# Patient Record
Sex: Male | Born: 1965 | Race: White | Hispanic: No | Marital: Single | Smoking: Former smoker
Health system: Southern US, Community
[De-identification: ages and names within clinical notes are randomized; demographics above are authoritative.]

## PROBLEM LIST (undated history)

## (undated) DIAGNOSIS — E119 Type 2 diabetes mellitus without complications: Secondary | ICD-10-CM

---

## 2016-02-27 DIAGNOSIS — E119 Type 2 diabetes mellitus without complications: Secondary | ICD-10-CM | POA: Diagnosis not present

## 2016-02-27 DIAGNOSIS — I10 Essential (primary) hypertension: Secondary | ICD-10-CM | POA: Diagnosis not present

## 2016-02-27 DIAGNOSIS — Z1389 Encounter for screening for other disorder: Secondary | ICD-10-CM | POA: Diagnosis not present

## 2016-02-27 DIAGNOSIS — E559 Vitamin D deficiency, unspecified: Secondary | ICD-10-CM | POA: Diagnosis not present

## 2016-02-27 DIAGNOSIS — Z79899 Other long term (current) drug therapy: Secondary | ICD-10-CM | POA: Diagnosis not present

## 2016-02-27 DIAGNOSIS — E663 Overweight: Secondary | ICD-10-CM | POA: Diagnosis not present

## 2016-02-27 DIAGNOSIS — Z6826 Body mass index (BMI) 26.0-26.9, adult: Secondary | ICD-10-CM | POA: Diagnosis not present

## 2016-06-30 DIAGNOSIS — L03116 Cellulitis of left lower limb: Secondary | ICD-10-CM | POA: Diagnosis not present

## 2017-02-03 DIAGNOSIS — E559 Vitamin D deficiency, unspecified: Secondary | ICD-10-CM | POA: Diagnosis not present

## 2017-02-03 DIAGNOSIS — Z79899 Other long term (current) drug therapy: Secondary | ICD-10-CM | POA: Diagnosis not present

## 2017-02-03 DIAGNOSIS — E785 Hyperlipidemia, unspecified: Secondary | ICD-10-CM | POA: Diagnosis not present

## 2017-02-03 DIAGNOSIS — I10 Essential (primary) hypertension: Secondary | ICD-10-CM | POA: Diagnosis not present

## 2017-02-03 DIAGNOSIS — E1165 Type 2 diabetes mellitus with hyperglycemia: Secondary | ICD-10-CM | POA: Diagnosis not present

## 2017-02-03 DIAGNOSIS — E669 Obesity, unspecified: Secondary | ICD-10-CM | POA: Diagnosis not present

## 2017-02-19 ENCOUNTER — Telehealth: Payer: Self-pay | Admitting: General Practice

## 2017-02-19 NOTE — Telephone Encounter (Signed)
Call [facility] to clarify if we received the referral for the patient.

## 2017-05-14 ENCOUNTER — Ambulatory Visit: Payer: Self-pay | Admitting: Endocrinology

## 2017-08-20 DIAGNOSIS — E785 Hyperlipidemia, unspecified: Secondary | ICD-10-CM | POA: Diagnosis not present

## 2017-08-20 DIAGNOSIS — Z1331 Encounter for screening for depression: Secondary | ICD-10-CM | POA: Diagnosis not present

## 2017-08-20 DIAGNOSIS — E1165 Type 2 diabetes mellitus with hyperglycemia: Secondary | ICD-10-CM | POA: Diagnosis not present

## 2017-08-20 DIAGNOSIS — I10 Essential (primary) hypertension: Secondary | ICD-10-CM | POA: Diagnosis not present

## 2017-08-20 DIAGNOSIS — J449 Chronic obstructive pulmonary disease, unspecified: Secondary | ICD-10-CM | POA: Diagnosis not present

## 2017-12-17 DIAGNOSIS — E1165 Type 2 diabetes mellitus with hyperglycemia: Secondary | ICD-10-CM | POA: Diagnosis not present

## 2017-12-17 DIAGNOSIS — J449 Chronic obstructive pulmonary disease, unspecified: Secondary | ICD-10-CM | POA: Diagnosis not present

## 2017-12-17 DIAGNOSIS — E785 Hyperlipidemia, unspecified: Secondary | ICD-10-CM | POA: Diagnosis not present

## 2017-12-17 DIAGNOSIS — E039 Hypothyroidism, unspecified: Secondary | ICD-10-CM | POA: Diagnosis not present

## 2017-12-17 DIAGNOSIS — E559 Vitamin D deficiency, unspecified: Secondary | ICD-10-CM | POA: Diagnosis not present

## 2017-12-17 DIAGNOSIS — E669 Obesity, unspecified: Secondary | ICD-10-CM | POA: Diagnosis not present

## 2018-03-25 DIAGNOSIS — J449 Chronic obstructive pulmonary disease, unspecified: Secondary | ICD-10-CM | POA: Diagnosis not present

## 2018-03-25 DIAGNOSIS — E559 Vitamin D deficiency, unspecified: Secondary | ICD-10-CM | POA: Diagnosis not present

## 2018-03-25 DIAGNOSIS — E785 Hyperlipidemia, unspecified: Secondary | ICD-10-CM | POA: Diagnosis not present

## 2018-03-25 DIAGNOSIS — E669 Obesity, unspecified: Secondary | ICD-10-CM | POA: Diagnosis not present

## 2018-03-25 DIAGNOSIS — E1165 Type 2 diabetes mellitus with hyperglycemia: Secondary | ICD-10-CM | POA: Diagnosis not present

## 2018-12-21 DIAGNOSIS — R6889 Other general symptoms and signs: Secondary | ICD-10-CM | POA: Diagnosis not present

## 2018-12-21 DIAGNOSIS — Z1331 Encounter for screening for depression: Secondary | ICD-10-CM | POA: Diagnosis not present

## 2018-12-21 DIAGNOSIS — E119 Type 2 diabetes mellitus without complications: Secondary | ICD-10-CM | POA: Diagnosis not present

## 2018-12-21 DIAGNOSIS — J449 Chronic obstructive pulmonary disease, unspecified: Secondary | ICD-10-CM | POA: Diagnosis not present

## 2018-12-28 DIAGNOSIS — J441 Chronic obstructive pulmonary disease with (acute) exacerbation: Secondary | ICD-10-CM | POA: Diagnosis not present

## 2019-01-09 DIAGNOSIS — R0602 Shortness of breath: Secondary | ICD-10-CM | POA: Diagnosis not present

## 2019-01-09 DIAGNOSIS — R0789 Other chest pain: Secondary | ICD-10-CM | POA: Diagnosis not present

## 2019-01-09 DIAGNOSIS — J449 Chronic obstructive pulmonary disease, unspecified: Secondary | ICD-10-CM | POA: Diagnosis not present

## 2019-01-09 DIAGNOSIS — Z6827 Body mass index (BMI) 27.0-27.9, adult: Secondary | ICD-10-CM | POA: Diagnosis not present

## 2019-01-13 DIAGNOSIS — R079 Chest pain, unspecified: Secondary | ICD-10-CM | POA: Diagnosis not present

## 2019-01-13 DIAGNOSIS — J90 Pleural effusion, not elsewhere classified: Secondary | ICD-10-CM | POA: Diagnosis not present

## 2019-01-13 DIAGNOSIS — Z6827 Body mass index (BMI) 27.0-27.9, adult: Secondary | ICD-10-CM | POA: Diagnosis not present

## 2019-01-13 DIAGNOSIS — R918 Other nonspecific abnormal finding of lung field: Secondary | ICD-10-CM | POA: Diagnosis not present

## 2019-01-20 DIAGNOSIS — R918 Other nonspecific abnormal finding of lung field: Secondary | ICD-10-CM | POA: Diagnosis not present

## 2019-01-20 DIAGNOSIS — I700 Atherosclerosis of aorta: Secondary | ICD-10-CM | POA: Diagnosis not present

## 2019-01-20 DIAGNOSIS — J90 Pleural effusion, not elsewhere classified: Secondary | ICD-10-CM | POA: Diagnosis not present

## 2019-01-20 DIAGNOSIS — I2510 Atherosclerotic heart disease of native coronary artery without angina pectoris: Secondary | ICD-10-CM | POA: Diagnosis not present

## 2019-01-20 DIAGNOSIS — J439 Emphysema, unspecified: Secondary | ICD-10-CM | POA: Diagnosis not present

## 2019-01-23 ENCOUNTER — Emergency Department (HOSPITAL_COMMUNITY): Payer: 59

## 2019-01-23 ENCOUNTER — Other Ambulatory Visit: Payer: Self-pay

## 2019-01-23 ENCOUNTER — Encounter (HOSPITAL_COMMUNITY): Payer: Self-pay | Admitting: *Deleted

## 2019-01-23 ENCOUNTER — Inpatient Hospital Stay (HOSPITAL_COMMUNITY)
Admission: EM | Admit: 2019-01-23 | Discharge: 2019-01-31 | DRG: 853 | Disposition: A | Discharge status: Home / Self Care | Payer: 59 | Attending: Student | Admitting: Student

## 2019-01-23 DIAGNOSIS — D638 Anemia in other chronic diseases classified elsewhere: Secondary | ICD-10-CM | POA: Diagnosis present

## 2019-01-23 DIAGNOSIS — Z419 Encounter for procedure for purposes other than remedying health state, unspecified: Secondary | ICD-10-CM

## 2019-01-23 DIAGNOSIS — R059 Cough, unspecified: Secondary | ICD-10-CM

## 2019-01-23 DIAGNOSIS — E1165 Type 2 diabetes mellitus with hyperglycemia: Secondary | ICD-10-CM | POA: Diagnosis present

## 2019-01-23 DIAGNOSIS — R05 Cough: Secondary | ICD-10-CM | POA: Diagnosis not present

## 2019-01-23 DIAGNOSIS — Z79899 Other long term (current) drug therapy: Secondary | ICD-10-CM

## 2019-01-23 DIAGNOSIS — J939 Pneumothorax, unspecified: Secondary | ICD-10-CM

## 2019-01-23 DIAGNOSIS — J188 Other pneumonia, unspecified organism: Secondary | ICD-10-CM | POA: Diagnosis present

## 2019-01-23 DIAGNOSIS — E871 Hypo-osmolality and hyponatremia: Secondary | ICD-10-CM | POA: Diagnosis present

## 2019-01-23 DIAGNOSIS — A419 Sepsis, unspecified organism: Principal | ICD-10-CM | POA: Diagnosis present

## 2019-01-23 DIAGNOSIS — D649 Anemia, unspecified: Secondary | ICD-10-CM

## 2019-01-23 DIAGNOSIS — K069 Disorder of gingiva and edentulous alveolar ridge, unspecified: Secondary | ICD-10-CM | POA: Diagnosis present

## 2019-01-23 DIAGNOSIS — E785 Hyperlipidemia, unspecified: Secondary | ICD-10-CM | POA: Diagnosis present

## 2019-01-23 DIAGNOSIS — Z79890 Hormone replacement therapy (postmenopausal): Secondary | ICD-10-CM

## 2019-01-23 DIAGNOSIS — I10 Essential (primary) hypertension: Secondary | ICD-10-CM | POA: Diagnosis present

## 2019-01-23 DIAGNOSIS — J90 Pleural effusion, not elsewhere classified: Secondary | ICD-10-CM | POA: Diagnosis present

## 2019-01-23 DIAGNOSIS — Z7984 Long term (current) use of oral hypoglycemic drugs: Secondary | ICD-10-CM

## 2019-01-23 DIAGNOSIS — F17200 Nicotine dependence, unspecified, uncomplicated: Secondary | ICD-10-CM | POA: Diagnosis present

## 2019-01-23 DIAGNOSIS — Z9889 Other specified postprocedural states: Secondary | ICD-10-CM

## 2019-01-23 DIAGNOSIS — Z1159 Encounter for screening for other viral diseases: Secondary | ICD-10-CM

## 2019-01-23 DIAGNOSIS — J869 Pyothorax without fistula: Secondary | ICD-10-CM | POA: Diagnosis present

## 2019-01-23 DIAGNOSIS — E1110 Type 2 diabetes mellitus with ketoacidosis without coma: Secondary | ICD-10-CM | POA: Diagnosis present

## 2019-01-23 DIAGNOSIS — R0609 Other forms of dyspnea: Secondary | ICD-10-CM

## 2019-01-23 DIAGNOSIS — J440 Chronic obstructive pulmonary disease with acute lower respiratory infection: Secondary | ICD-10-CM | POA: Diagnosis present

## 2019-01-23 DIAGNOSIS — E039 Hypothyroidism, unspecified: Secondary | ICD-10-CM | POA: Diagnosis present

## 2019-01-23 DIAGNOSIS — K089 Disorder of teeth and supporting structures, unspecified: Secondary | ICD-10-CM

## 2019-01-23 HISTORY — DX: Type 2 diabetes mellitus without complications: E11.9

## 2019-01-23 MED ORDER — SODIUM CHLORIDE 0.9% FLUSH: 3.0000 mL | Freq: Once | INTRAVENOUS | Status: DC

## 2019-01-23 NOTE — ED Triage Notes (Signed)
Pt reports already being diagnosed with "lung infection" and having increase in sob, fever and cough. Mask on pt on arrival.

## 2019-01-23 NOTE — ED Notes (Signed)
Spoke with pt wife who brought back pt paperwork. He was seen today at [facility]. He has failed 2 rounds of IV antibiotics outpatient for possible PNA. CT on 5/29 shows a "cavitary lung mass" in the left lower lung with fluid collection. Negative covid test 4/29. Has felt bad for "over a month"

## 2019-01-24 ENCOUNTER — Encounter (HOSPITAL_COMMUNITY): Payer: Self-pay | Admitting: Internal Medicine

## 2019-01-24 ENCOUNTER — Inpatient Hospital Stay (HOSPITAL_COMMUNITY): Payer: 59

## 2019-01-24 DIAGNOSIS — A419 Sepsis, unspecified organism: Principal | ICD-10-CM

## 2019-01-24 DIAGNOSIS — Z79899 Other long term (current) drug therapy: Secondary | ICD-10-CM | POA: Diagnosis not present

## 2019-01-24 DIAGNOSIS — Z7984 Long term (current) use of oral hypoglycemic drugs: Secondary | ICD-10-CM | POA: Diagnosis not present

## 2019-01-24 DIAGNOSIS — E119 Type 2 diabetes mellitus without complications: Secondary | ICD-10-CM

## 2019-01-24 DIAGNOSIS — I10 Essential (primary) hypertension: Secondary | ICD-10-CM | POA: Diagnosis present

## 2019-01-24 DIAGNOSIS — E785 Hyperlipidemia, unspecified: Secondary | ICD-10-CM | POA: Diagnosis present

## 2019-01-24 DIAGNOSIS — J188 Other pneumonia, unspecified organism: Secondary | ICD-10-CM | POA: Diagnosis present

## 2019-01-24 DIAGNOSIS — Z9889 Other specified postprocedural states: Secondary | ICD-10-CM | POA: Diagnosis not present

## 2019-01-24 DIAGNOSIS — E1110 Type 2 diabetes mellitus with ketoacidosis without coma: Secondary | ICD-10-CM | POA: Diagnosis present

## 2019-01-24 DIAGNOSIS — Z1159 Encounter for screening for other viral diseases: Secondary | ICD-10-CM | POA: Diagnosis not present

## 2019-01-24 DIAGNOSIS — J90 Pleural effusion, not elsewhere classified: Secondary | ICD-10-CM

## 2019-01-24 DIAGNOSIS — E1165 Type 2 diabetes mellitus with hyperglycemia: Secondary | ICD-10-CM | POA: Diagnosis present

## 2019-01-24 DIAGNOSIS — E039 Hypothyroidism, unspecified: Secondary | ICD-10-CM | POA: Diagnosis present

## 2019-01-24 DIAGNOSIS — D638 Anemia in other chronic diseases classified elsewhere: Secondary | ICD-10-CM | POA: Diagnosis present

## 2019-01-24 DIAGNOSIS — J869 Pyothorax without fistula: Secondary | ICD-10-CM | POA: Diagnosis present

## 2019-01-24 DIAGNOSIS — J440 Chronic obstructive pulmonary disease with acute lower respiratory infection: Secondary | ICD-10-CM | POA: Diagnosis present

## 2019-01-24 DIAGNOSIS — R05 Cough: Secondary | ICD-10-CM | POA: Diagnosis present

## 2019-01-24 DIAGNOSIS — K0889 Other specified disorders of teeth and supporting structures: Secondary | ICD-10-CM | POA: Diagnosis not present

## 2019-01-24 DIAGNOSIS — K069 Disorder of gingiva and edentulous alveolar ridge, unspecified: Secondary | ICD-10-CM | POA: Diagnosis present

## 2019-01-24 DIAGNOSIS — F17200 Nicotine dependence, unspecified, uncomplicated: Secondary | ICD-10-CM | POA: Diagnosis present

## 2019-01-24 DIAGNOSIS — Z79890 Hormone replacement therapy (postmenopausal): Secondary | ICD-10-CM | POA: Diagnosis not present

## 2019-01-24 DIAGNOSIS — E871 Hypo-osmolality and hyponatremia: Secondary | ICD-10-CM | POA: Diagnosis present

## 2019-01-24 LAB — HEPATIC FUNCTION PANEL
ALT: 18 U/L (ref 0–44)
AST: 19 U/L (ref 15–41)
Albumin: 1.8 g/dL — ABNORMAL LOW (ref 3.5–5.0)
Alkaline Phosphatase: 176 U/L — ABNORMAL HIGH (ref 38–126)
Bilirubin, Direct: 0.2 mg/dL (ref 0.0–0.2)
Indirect Bilirubin: 0.9 mg/dL (ref 0.3–0.9)
Total Bilirubin: 1.1 mg/dL (ref 0.3–1.2)
Total Protein: 6.2 g/dL — ABNORMAL LOW (ref 6.5–8.1)

## 2019-01-24 LAB — CBC WITH DIFFERENTIAL/PLATELET
Abs Immature Granulocytes: 0.26 10*3/uL — ABNORMAL HIGH (ref 0.00–0.07)
Abs Immature Granulocytes: 0.19 10*3/uL — ABNORMAL HIGH (ref 0.00–0.07)
Basophils Absolute: 0 10*3/uL (ref 0.0–0.1)
Basophils Absolute: 0 10*3/uL (ref 0.0–0.1)
Basophils Relative: 0 %
Basophils Relative: 0 %
Eosinophils Absolute: 0 10*3/uL (ref 0.0–0.5)
Eosinophils Absolute: 0 10*3/uL (ref 0.0–0.5)
Eosinophils Relative: 0 %
Eosinophils Relative: 0 %
HCT: 33.4 % — ABNORMAL LOW (ref 39.0–52.0)
HCT: 29.4 % — ABNORMAL LOW (ref 39.0–52.0)
Hemoglobin: 10.7 g/dL — ABNORMAL LOW (ref 13.0–17.0)
Hemoglobin: 9.4 g/dL — ABNORMAL LOW (ref 13.0–17.0)
Immature Granulocytes: 1 %
Immature Granulocytes: 1 %
Lymphocytes Relative: 10 %
Lymphocytes Relative: 12 %
Lymphs Abs: 2.3 10*3/uL (ref 0.7–4.0)
Lymphs Abs: 2.2 10*3/uL (ref 0.7–4.0)
MCH: 27.4 pg (ref 26.0–34.0)
MCH: 27.5 pg (ref 26.0–34.0)
MCHC: 32 g/dL (ref 30.0–36.0)
MCHC: 32 g/dL (ref 30.0–36.0)
MCV: 85.4 fL (ref 80.0–100.0)
MCV: 86 fL (ref 80.0–100.0)
Monocytes Absolute: 2.2 10*3/uL — ABNORMAL HIGH (ref 0.1–1.0)
Monocytes Absolute: 1.8 10*3/uL — ABNORMAL HIGH (ref 0.1–1.0)
Monocytes Relative: 9 %
Monocytes Relative: 9 %
Neutro Abs: 19 10*3/uL — ABNORMAL HIGH (ref 1.7–7.7)
Neutro Abs: 14.7 10*3/uL — ABNORMAL HIGH (ref 1.7–7.7)
Neutrophils Relative %: 80 %
Neutrophils Relative %: 78 %
Platelets: 734 10*3/uL — ABNORMAL HIGH (ref 150–400)
Platelets: 584 10*3/uL — ABNORMAL HIGH (ref 150–400)
RBC: 3.91 MIL/uL — ABNORMAL LOW (ref 4.22–5.81)
RBC: 3.42 MIL/uL — ABNORMAL LOW (ref 4.22–5.81)
RDW: 13.5 % (ref 11.5–15.5)
RDW: 13.4 % (ref 11.5–15.5)
WBC: 23.8 10*3/uL — ABNORMAL HIGH (ref 4.0–10.5)
WBC: 19 10*3/uL — ABNORMAL HIGH (ref 4.0–10.5)
nRBC: 0 % (ref 0.0–0.2)
nRBC: 0 % (ref 0.0–0.2)

## 2019-01-24 LAB — URINALYSIS, ROUTINE W REFLEX MICROSCOPIC
Bacteria, UA: NONE SEEN
Bacteria, UA: NONE SEEN
Bilirubin Urine: NEGATIVE
Bilirubin Urine: NEGATIVE
Glucose, UA: 50 mg/dL — AB
Glucose, UA: >=500 mg/dL — AB
Hgb urine dipstick: NEGATIVE
Hgb urine dipstick: NEGATIVE
Ketones, ur: 20 mg/dL — AB
Ketones, ur: NEGATIVE mg/dL
Leukocytes,Ua: NEGATIVE
Leukocytes,Ua: NEGATIVE
Nitrite: NEGATIVE
Nitrite: NEGATIVE
Protein, ur: 30 mg/dL — AB
Protein, ur: 30 mg/dL — AB
Specific Gravity, Urine: 1.021 (ref 1.005–1.030)
Specific Gravity, Urine: 1.03 (ref 1.005–1.030)
pH: 5 (ref 5.0–8.0)
pH: 7 (ref 5.0–8.0)

## 2019-01-24 LAB — COMPREHENSIVE METABOLIC PANEL
ALT: 21 U/L (ref 0–44)
AST: 21 U/L (ref 15–41)
Albumin: 2.3 g/dL — ABNORMAL LOW (ref 3.5–5.0)
Alkaline Phosphatase: 231 U/L — ABNORMAL HIGH (ref 38–126)
Anion gap: 16 — ABNORMAL HIGH (ref 5–15)
BUN: 22 mg/dL — ABNORMAL HIGH (ref 6–20)
CO2: 25 mmol/L (ref 22–32)
Calcium: 8.8 mg/dL — ABNORMAL LOW (ref 8.9–10.3)
Chloride: 82 mmol/L — ABNORMAL LOW (ref 98–111)
Creatinine, Ser: 0.97 mg/dL (ref 0.61–1.24)
GFR calc Af Amer: >60 mL/min (ref >60)
GFR calc non Af Amer: >60 mL/min (ref >60)
Glucose, Bld: 432 mg/dL — ABNORMAL HIGH (ref 70–99)
Potassium: 4.7 mmol/L (ref 3.5–5.1)
Sodium: 123 mmol/L — ABNORMAL LOW (ref 135–145)
Total Bilirubin: 1.1 mg/dL (ref 0.3–1.2)
Total Protein: 7.6 g/dL (ref 6.5–8.1)

## 2019-01-24 LAB — BASIC METABOLIC PANEL
Anion gap: 16 — ABNORMAL HIGH (ref 5–15)
Anion gap: 11 (ref 5–15)
Anion gap: 10 (ref 5–15)
Anion gap: 10 (ref 5–15)
Anion gap: 9 (ref 5–15)
BUN: 18 mg/dL (ref 6–20)
BUN: 16 mg/dL (ref 6–20)
BUN: 17 mg/dL (ref 6–20)
BUN: 14 mg/dL (ref 6–20)
BUN: 13 mg/dL (ref 6–20)
CO2: 23 mmol/L (ref 22–32)
CO2: 24 mmol/L (ref 22–32)
CO2: 24 mmol/L (ref 22–32)
CO2: 24 mmol/L (ref 22–32)
CO2: 25 mmol/L (ref 22–32)
Calcium: 8 mg/dL — ABNORMAL LOW (ref 8.9–10.3)
Calcium: 8 mg/dL — ABNORMAL LOW (ref 8.9–10.3)
Calcium: 7.9 mg/dL — ABNORMAL LOW (ref 8.9–10.3)
Calcium: 7.9 mg/dL — ABNORMAL LOW (ref 8.9–10.3)
Calcium: 7.8 mg/dL — ABNORMAL LOW (ref 8.9–10.3)
Chloride: 91 mmol/L — ABNORMAL LOW (ref 98–111)
Chloride: 94 mmol/L — ABNORMAL LOW (ref 98–111)
Chloride: 96 mmol/L — ABNORMAL LOW (ref 98–111)
Chloride: 97 mmol/L — ABNORMAL LOW (ref 98–111)
Chloride: 96 mmol/L — ABNORMAL LOW (ref 98–111)
Creatinine, Ser: 0.85 mg/dL (ref 0.61–1.24)
Creatinine, Ser: 0.71 mg/dL (ref 0.61–1.24)
Creatinine, Ser: 0.62 mg/dL (ref 0.61–1.24)
Creatinine, Ser: 0.58 mg/dL — ABNORMAL LOW (ref 0.61–1.24)
Creatinine, Ser: 0.67 mg/dL (ref 0.61–1.24)
GFR calc Af Amer: >60 mL/min (ref >60)
GFR calc Af Amer: >60 mL/min (ref >60)
GFR calc Af Amer: >60 mL/min (ref >60)
GFR calc Af Amer: >60 mL/min (ref >60)
GFR calc Af Amer: >60 mL/min (ref >60)
GFR calc non Af Amer: >60 mL/min (ref >60)
GFR calc non Af Amer: >60 mL/min (ref >60)
GFR calc non Af Amer: >60 mL/min (ref >60)
GFR calc non Af Amer: >60 mL/min (ref >60)
GFR calc non Af Amer: >60 mL/min (ref >60)
Glucose, Bld: 348 mg/dL — ABNORMAL HIGH (ref 70–99)
Glucose, Bld: 353 mg/dL — ABNORMAL HIGH (ref 70–99)
Glucose, Bld: 220 mg/dL — ABNORMAL HIGH (ref 70–99)
Glucose, Bld: 135 mg/dL — ABNORMAL HIGH (ref 70–99)
Glucose, Bld: 150 mg/dL — ABNORMAL HIGH (ref 70–99)
Potassium: 4.2 mmol/L (ref 3.5–5.1)
Potassium: 4.1 mmol/L (ref 3.5–5.1)
Potassium: 3.7 mmol/L (ref 3.5–5.1)
Potassium: 3.7 mmol/L (ref 3.5–5.1)
Potassium: 3.7 mmol/L (ref 3.5–5.1)
Sodium: 130 mmol/L — ABNORMAL LOW (ref 135–145)
Sodium: 129 mmol/L — ABNORMAL LOW (ref 135–145)
Sodium: 130 mmol/L — ABNORMAL LOW (ref 135–145)
Sodium: 131 mmol/L — ABNORMAL LOW (ref 135–145)
Sodium: 130 mmol/L — ABNORMAL LOW (ref 135–145)

## 2019-01-24 LAB — BLOOD GAS, ARTERIAL
Acid-Base Excess: 4.5 mmol/L — ABNORMAL HIGH (ref 0.0–2.0)
Bicarbonate: 27.5 mmol/L (ref 20.0–28.0)
Drawn by: 30136
O2 Saturation: 96.7 %
Patient temperature: 98.2
pCO2 arterial: 33 mmHg (ref 32.0–48.0)
pH, Arterial: 7.529 — ABNORMAL HIGH (ref 7.350–7.450)
pO2, Arterial: 76.2 mmHg — ABNORMAL LOW (ref 83.0–108.0)

## 2019-01-24 LAB — SARS CORONAVIRUS 2 BY RT PCR (HOSPITAL ORDER, PERFORMED IN ~~LOC~~ HOSPITAL LAB): SARS Coronavirus 2: NEGATIVE

## 2019-01-24 LAB — PROTIME-INR
INR: 1.2 (ref 0.8–1.2)
INR: 1.3 — ABNORMAL HIGH (ref 0.8–1.2)
Prothrombin Time: 15.3 seconds — ABNORMAL HIGH (ref 11.4–15.2)
Prothrombin Time: 15.5 seconds — ABNORMAL HIGH (ref 11.4–15.2)

## 2019-01-24 LAB — GLUCOSE, CAPILLARY
Glucose-Capillary: 339 mg/dL — ABNORMAL HIGH (ref 70–99)
Glucose-Capillary: 315 mg/dL — ABNORMAL HIGH (ref 70–99)
Glucose-Capillary: 265 mg/dL — ABNORMAL HIGH (ref 70–99)
Glucose-Capillary: 254 mg/dL — ABNORMAL HIGH (ref 70–99)
Glucose-Capillary: 215 mg/dL — ABNORMAL HIGH (ref 70–99)
Glucose-Capillary: 162 mg/dL — ABNORMAL HIGH (ref 70–99)
Glucose-Capillary: 143 mg/dL — ABNORMAL HIGH (ref 70–99)
Glucose-Capillary: 128 mg/dL — ABNORMAL HIGH (ref 70–99)
Glucose-Capillary: 147 mg/dL — ABNORMAL HIGH (ref 70–99)

## 2019-01-24 LAB — APTT: aPTT: 32 seconds (ref 24–36)

## 2019-01-24 LAB — SURGICAL PCR SCREEN
MRSA, PCR: NEGATIVE
Staphylococcus aureus: NEGATIVE

## 2019-01-24 LAB — HEMOGLOBIN A1C
Hgb A1c MFr Bld: 11 % — ABNORMAL HIGH (ref 4.8–5.6)
Mean Plasma Glucose: 269 mg/dL

## 2019-01-24 LAB — LACTIC ACID, PLASMA
Lactic Acid, Venous: 1.2 mmol/L (ref 0.5–1.9)
Lactic Acid, Venous: 1.4 mmol/L (ref 0.5–1.9)
Lactic Acid, Venous: 1.5 mmol/L (ref 0.5–1.9)
Lactic Acid, Venous: 2.5 mmol/L (ref 0.5–1.9)

## 2019-01-24 LAB — HIV ANTIBODY (ROUTINE TESTING W REFLEX): HIV Screen 4th Generation wRfx: NONREACTIVE

## 2019-01-24 LAB — ABO/RH: ABO/RH(D): A POS

## 2019-01-24 LAB — CBG MONITORING, ED: Glucose-Capillary: 322 mg/dL — ABNORMAL HIGH (ref 70–99)

## 2019-01-24 LAB — TROPONIN I: Troponin I: <0.03 ng/mL (ref <0.03)

## 2019-01-24 LAB — STREP PNEUMONIAE URINARY ANTIGEN: Strep Pneumo Urinary Antigen: NEGATIVE

## 2019-01-24 LAB — PREPARE RBC (CROSSMATCH)

## 2019-01-24 LAB — PROCALCITONIN: Procalcitonin: 3.78 ng/mL

## 2019-01-24 MED ORDER — INSULIN ASPART 100 UNIT/ML ~~LOC~~ SOLN: 0.0000 [IU] | Freq: Three times a day (TID) | SUBCUTANEOUS | Status: DC

## 2019-01-24 MED ORDER — ATORVASTATIN CALCIUM 10 MG PO TABS
20.0000 mg | ORAL_TABLET | Freq: Every day | ORAL | Status: DC
  Administered 2019-01-24 – 2019-01-30 (×7): 20 mg via ORAL
  Filled 2019-01-24 (×8): qty 2

## 2019-01-24 MED ORDER — SODIUM CHLORIDE 0.9 % IV SOLN
2.0000 g | INTRAVENOUS | Status: DC
  Filled 2019-01-24: qty 2

## 2019-01-24 MED ORDER — ONDANSETRON HCL 4 MG/2ML IJ SOLN: 4.0000 mg | Freq: Four times a day (QID) | INTRAMUSCULAR | Status: DC | PRN

## 2019-01-24 MED ORDER — IOHEXOL 300 MG/ML  SOLN
75.0000 mL | Freq: Once | INTRAMUSCULAR | Status: AC | PRN
  Administered 2019-01-24: 75 mL via INTRAVENOUS

## 2019-01-24 MED ORDER — ONDANSETRON HCL 4 MG PO TABS: 4.0000 mg | ORAL_TABLET | Freq: Four times a day (QID) | ORAL | Status: DC | PRN

## 2019-01-24 MED ORDER — PIPERACILLIN-TAZOBACTAM 3.375 G IVPB
3.3750 g | Freq: Three times a day (TID) | INTRAVENOUS | Status: DC
  Administered 2019-01-24 – 2019-01-30 (×17): 3.375 g via INTRAVENOUS
  Filled 2019-01-24 (×19): qty 50

## 2019-01-24 MED ORDER — INSULIN ASPART 100 UNIT/ML ~~LOC~~ SOLN: 0.0000 [IU] | Freq: Every day | SUBCUTANEOUS | Status: DC

## 2019-01-24 MED ORDER — INSULIN GLARGINE 100 UNIT/ML ~~LOC~~ SOLN
10.0000 [IU] | Freq: Every day | SUBCUTANEOUS | Status: DC
  Administered 2019-01-24 – 2019-01-25 (×2): 10 [IU] via SUBCUTANEOUS
  Filled 2019-01-24 (×2): qty 0.1

## 2019-01-24 MED ORDER — DEXTROSE-NACL 5-0.45 % IV SOLN
INTRAVENOUS | Status: DC
  Administered 2019-01-24: 14:00:00 via INTRAVENOUS

## 2019-01-24 MED ORDER — SODIUM CHLORIDE 0.9 % IV SOLN
INTRAVENOUS | Status: DC
  Administered 2019-01-24 (×2): via INTRAVENOUS

## 2019-01-24 MED ORDER — ACETAMINOPHEN 650 MG RE SUPP: 650.0000 mg | Freq: Four times a day (QID) | RECTAL | Status: DC | PRN

## 2019-01-24 MED ORDER — SODIUM CHLORIDE 0.9 % IV SOLN: INTRAVENOUS | Status: DC

## 2019-01-24 MED ORDER — INSULIN REGULAR BOLUS VIA INFUSION
0.0000 [IU] | Freq: Three times a day (TID) | INTRAVENOUS | Status: DC
  Filled 2019-01-24: qty 10

## 2019-01-24 MED ORDER — SODIUM CHLORIDE 0.9 % IV BOLUS (SEPSIS)
500.0000 mL | Freq: Once | INTRAVENOUS | Status: AC
  Administered 2019-01-24: 500 mL via INTRAVENOUS

## 2019-01-24 MED ORDER — INSULIN REGULAR(HUMAN) IN NACL 100-0.9 UT/100ML-% IV SOLN
INTRAVENOUS | Status: DC
  Administered 2019-01-24: 2.6 [IU]/h via INTRAVENOUS
  Filled 2019-01-24: qty 100

## 2019-01-24 MED ORDER — DEXTROSE 50 % IV SOLN: 25.0000 mL | INTRAVENOUS | Status: DC | PRN

## 2019-01-24 MED ORDER — CHLORHEXIDINE GLUCONATE 0.12 % MT SOLN
15.0000 mL | Freq: Four times a day (QID) | OROMUCOSAL | Status: DC
  Administered 2019-01-24 – 2019-01-28 (×14): 15 mL via OROMUCOSAL
  Filled 2019-01-24 (×12): qty 15

## 2019-01-24 MED ORDER — SODIUM CHLORIDE 0.9 % IV SOLN
2.0000 g | INTRAVENOUS | Status: DC
  Administered 2019-01-24: 2 g via INTRAVENOUS
  Filled 2019-01-24: qty 20

## 2019-01-24 MED ORDER — ACETAMINOPHEN 325 MG PO TABS
650.0000 mg | ORAL_TABLET | Freq: Four times a day (QID) | ORAL | Status: DC | PRN
  Administered 2019-01-25 (×2): 650 mg via ORAL
  Filled 2019-01-24 (×3): qty 2

## 2019-01-24 MED ORDER — SODIUM CHLORIDE 0.9 % IV BOLUS (SEPSIS)
1000.0000 mL | Freq: Once | INTRAVENOUS | Status: AC
  Administered 2019-01-24: 1000 mL via INTRAVENOUS

## 2019-01-24 MED ORDER — SODIUM CHLORIDE 0.9 % IV SOLN
500.0000 mg | INTRAVENOUS | Status: DC
  Administered 2019-01-24: 500 mg via INTRAVENOUS
  Filled 2019-01-24: qty 500

## 2019-01-24 MED ORDER — LIVING WELL WITH DIABETES BOOK
Freq: Once | Status: AC
  Administered 2019-01-24: 18:00:00
  Filled 2019-01-24: qty 1

## 2019-01-24 MED ORDER — INSULIN ASPART 100 UNIT/ML ~~LOC~~ SOLN
0.0000 [IU] | Freq: Three times a day (TID) | SUBCUTANEOUS | Status: DC
  Administered 2019-01-24: 2 [IU] via SUBCUTANEOUS
  Administered 2019-01-25 (×2): 5 [IU] via SUBCUTANEOUS
  Administered 2019-01-25: 3 [IU] via SUBCUTANEOUS
  Administered 2019-01-26: 12:00:00 5 [IU] via SUBCUTANEOUS

## 2019-01-24 MED ORDER — LEVOTHYROXINE SODIUM 25 MCG PO TABS
25.0000 ug | ORAL_TABLET | Freq: Every day | ORAL | Status: DC
  Administered 2019-01-24 – 2019-01-31 (×8): 25 ug via ORAL
  Filled 2019-01-24 (×8): qty 1

## 2019-01-24 NOTE — ED Notes (Signed)
Breakfast ordered 

## 2019-01-24 NOTE — Progress Notes (Signed)
Admitted this morning for sob and productive cough.  Work-up noted to have loculated effusion on the left side.  Seen by critical care service recommending evaluation by cardiothoracic surgery for VATS +/- bronchoscopy. Currently negative pressure isolation room. On IV zosyn now. HIV and Quant TB pending.  Trop - negative.   Add lantus 10units daily, ISS, accuchecks. AG closed by still hyperglycemic.  Call with questions if needed   Gerlean Ren MD [facility]

## 2019-01-24 NOTE — Progress Notes (Signed)
Pharmacy Antibiotic Note  Gary Hudson is a 53 y.o. male admitted on 01/23/2019 with sepsis.  Pharmacy has been consulted for Zosyn dosing sepsis/CAP.  Plan: Zosyn 3.375 g IV q8h (4 hour infusion) Monitor clinical status, renal function and culture results daily.    Height: 5\' 11"  (180.3 cm) Weight: 182 lb 3.2 oz (82.6 kg) IBW/kg (Calculated) : 75.3  Temp (24hrs), Avg:98.9 F (37.2 C), Min:98.2 F (36.8 C), Max:99.6 F (37.6 C)  Recent Labs  Lab 01/23/19 2351 01/23/19 2352 01/24/19 0146 01/24/19 0504 01/24/19 0857  WBC 23.8*  --   --  19.0*  --   CREATININE 0.97  --   --  0.85 0.71  LATICACIDVEN  --  2.5* 1.4 1.2 1.5    Estimated Creatinine Clearance: 112.4 mL/min (by C-G formula based on SCr of 0.71 mg/dL).    No Known Allergies  Antimicrobials this admission: Zosyn 6/1>> Azithromycin 6/2 x1 Ceftriaxone 6/2 x1   Dose adjustments this admission:   Microbiology results: 6/2 Covid: negative 6/2 BCx: sent   Thank you for allowing pharmacy to be a part of this patient's care. Nicole Cella, RPh Clinical Pharmacist [phone] Please check AMION for all [place] phone numbers After 10:00 PM, call [place] [phone]  01/24/2019 12:20 PM

## 2019-01-24 NOTE — Consult Note (Addendum)
[address]       [place] [address]             [phone]        Bernie A Timberman [place] Medical Record #[id number] Date of Birth: 09-May-1966  Referring: No ref. provider found Primary Care: Patient, No Pcp Per Primary Cardiologist:No primary care provider on file.  Chief Complaint:   Cough with shortness of breath    History of Present Illness:    Mr. 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  He developed a cough associated with shortness of breath and pain in his left lateral chest wall about 6 weeks ago.  He was initially seen by his primary care provider at [facility] on 12/21/2018.  He was treated with Zithromax and steroids for suspected pneumonia.  After his symptoms persisted for another week, his antibiotic was changed to Levaquin.  He was again seen in follow-up with persistent symptoms about 2 weeks ago.  This prompted a chest x-ray that showed left lower lobe cavitary mass.  Follow-up CT on 01/20/19 showed worsening of the cavitary mass with a pleural effusion on the left.  He was encouraged to seek further medical attention and he elected to come to the [facility] emergency room and presented here today.  He reports having fever in excess of 103 degrees at home.  His temperature on arrival here was 99 degrees.  He also reports having night sweats and having weight loss in excess of 30 pounds over the past month.    Work-up in the emergency room included a CBC significant for white blood count of 19,000.  Hemoglobin was 9.4 g hematocrit 29% sodium was 130 and electrolytes were otherwise unremarkable.  His initial blood glucose was approximately 320.  Chest x-ray and CT scan of the chest were repeated today with results detailed below.  These findings are very concerning for lung cancer with possible  mediastinal metastasis.  He also has a sizable left pleural effusion presumably representing an empyema likely resulting from a postobstructive pneumonia.   We have been asked to see Mr. Ernest for drainage of the empyema, bronchoscopy, and further diagnostic work-up with biopsy as appropriate.   Current Activity/ Functional Status:   Zubrod Score: At the time of surgery this patients most appropriate activity status/level should be described as: []     0    Normal activity, no symptoms [x]     1    Restricted in physical strenuous activity but ambulatory, able to do out light work []     2    Ambulatory and capable of self care, unable to do work activities, up and about                 more than 50%  Of the time                            []     3    Only limited self care, in bed greater than 50% of waking hours []     4    Completely disabled, no self care, confined to bed or chair []     5    Moribund  Past Medical History:  Diagnosis Date   Diabetes mellitus without complication ([place])     History reviewed. No pertinent  surgical history.  Social History   Tobacco Use  Smoking Status Former Smoker  Smokeless Tobacco Never Used    Social History   Substance and Sexual Activity  Alcohol Use Never   Frequency: Never     No Known Allergies  Current Facility-Administered Medications  Medication Dose Route Frequency Provider Last Rate Last Dose   0.9 %  sodium chloride infusion   Intravenous Continuous Rise Patience, MD   Stopped at 01/24/19 1338   acetaminophen (TYLENOL) tablet 650 mg  650 mg Oral Q6H PRN Rise Patience, MD       Or   acetaminophen (TYLENOL) suppository 650 mg  650 mg Rectal Q6H PRN Rise Patience, MD       atorvastatin (LIPITOR) tablet 20 mg  20 mg Oral q1800 Rise Patience, MD       dextrose 50 % solution 25 mL  25 mL Intravenous PRN Rise Patience, MD       insulin aspart (novoLOG) injection 0-15 Units  0-15 Units  Subcutaneous TID WC Amin, Ankit Chirag, MD       insulin aspart (novoLOG) injection 0-5 Units  0-5 Units Subcutaneous QHS Amin, Ankit Chirag, MD       insulin glargine (LANTUS) injection 10 Units  10 Units Subcutaneous Daily Damita Lack, MD   10 Units at 01/24/19 1432   levothyroxine (SYNTHROID) tablet 25 mcg  25 mcg Oral Q0600 Rise Patience, MD   25 mcg at 01/24/19 2202   living well with diabetes book MISC   Does not apply Once Amin, Jeanella Flattery, MD       ondansetron (ZOFRAN) tablet 4 mg  4 mg Oral Q6H PRN Rise Patience, MD       Or   ondansetron [facility]) injection 4 mg  4 mg Intravenous Q6H PRN Rise Patience, MD       piperacillin-tazobactam (ZOSYN) IVPB 3.375 g  3.375 g Intravenous Q8H Rise Patience, MD       sodium chloride flush (NS) 0.9 % injection 3 mL  3 mL Intravenous Once Rise Patience, MD        Medications Prior to Admission  Medication Sig Dispense Refill Last Dose   atorvastatin (LIPITOR) 20 MG tablet Take 20 mg by mouth daily.   1 Past Week at Unknown time   glimepiride (AMARYL) 4 MG tablet Take 4 mg by mouth daily with breakfast.   0 Past Week at Unknown time   levothyroxine (SYNTHROID, LEVOTHROID) 25 MCG tablet Take 25 mcg by mouth daily before breakfast.   1 Past Week at Unknown time   losartan-hydrochlorothiazide (HYZAAR) 50-12.5 MG tablet Take 1 tablet by mouth daily.   1 Past Week at Unknown time   metFORMIN (GLUCOPHAGE) 500 MG tablet Take 500-1,000 mg by mouth See admin instructions. 1000mg  in the morning and 500mg  in the evening  1 Past Week at Unknown time   Vitamin D, Ergocalciferol, (DRISDOL) 50000 units CAPS capsule Take 50,000 Units by mouth every 7 (seven) days. SUNDAYS  0 01/15/2019    Family History  Problem Relation Age of Onset   CAD Father    Diabetes Mellitus II Neg Hx      Review of Systems:   ROS    Cardiac Review of Systems: Y or  [    ]= no  Chest Pain [ y   ]  Resting SOB [  y  ] Exertional SOB  [ y ]  Orthopnea [  ]  Pedal Edema [   ]    Palpitations [  ] Syncope  [  ]   Presyncope [   ]  General Review of Systems: [Y] = yes [  ]=no Constitional: recent weight change Blue.Reese  ]; anorexia Blue.Reese  ]; fatigue [  ]; nausea [  ]; night sweats [ y ]; fever Blue.Reese ]; or chills [  ]                                                               Dental: Last Dentist visit:   Eye : blurred vision [  ]; diplopia [   ]; vision changes [  ];  Amaurosis fugax[  ]; Resp: cough [ y ];  wheezing[  ];  hemoptysis[y ]; shortness of breath[y  ]; paroxysmal nocturnal dyspnea[  ]; dyspnea on exertion[  ]; or orthopnea[  ];  GI:  gallstones[  ], vomiting[  ];  dysphagia[  ]; melena[  ];  hematochezia [  ]; heartburn[  ];   Hx of  Colonoscopy[  ]; GU: kidney stones [  ]; hematuria[  ];   dysuria [  ];  nocturia[  ];  history of     obstruction [  ]; urinary frequency [  ]             Skin: rash, swelling[  ];, hair loss[  ];  peripheral edema[  ];  or itching[  ]; Musculosketetal: myalgias[  ];  joint swelling[  ];  joint erythema[  ];  joint pain[  ];  back pain[  ];  Heme/Lymph: bruising[  ];  bleeding[  ];  anemia[  ];  Neuro: TIA[  ];  headaches[  ];  stroke[  ];  vertigo[  ];  seizures[  ];   paresthesias[  ];  difficulty walking[  ];  Psych:depression[  ]; anxiety[  ];  Endocrine: diabetes[  ];  thyroid dysfunction[  ];                Physical Exam: BP 123/62 (BP Location: Right Arm)    Pulse 83    Temp 98.2 F (36.8 C) (Oral)    Resp 16    Ht 5\' 11"  (1.803 m)    Wt 82.6 kg    SpO2 97%    BMI 25.41 kg/m    General appearance: alert, cooperative, appears stated age and mild distress Head: Normocephalic, without obvious abnormality, atraumatic Neck: no adenopathy, no carotid bruit, no JVD, supple, symmetrical, trachea midline and thyroid not enlarged, symmetric, no tenderness/mass/nodules Lymph nodes: No cervical, clavicular, or axillary adenopathy Resp: Unlabored on RA. Cough is currently  dry but he says it becomes productive of blood-streaked mucus off and on Cardio: regular rate and rhythm GI: Soft and non-tender. Extremities: extremities normal, atraumatic, no cyanosis or edema and all are well perfused. Neurologic: Grossly normal  Diagnostic Studies & Laboratory data:     Recent Radiology Findings:   Ct Chest W Contrast  Result Date: 01/24/2019 CLINICAL DATA:  Increasing shortness of breath. EXAM: CT CHEST WITH CONTRAST TECHNIQUE: Multidetector CT imaging of the chest was performed during intravenous contrast administration. CONTRAST:  55mL OMNIPAQUE IOHEXOL 300 MG/ML  SOLN COMPARISON:  01/20/2019 FINDINGS: Cardiovascular: Normal heart size. No pericardial effusion. There  is aortic and coronary atherosclerosis. No acute vascular finding Mediastinum/Nodes: Enlarged left hilar and bronchial nodes measuring up to 16 mm short axis. Lungs/Pleura: Re- demonstrated cavity with thick wall in the medial left lower lobe measuring 4.6 cm. There is also a cavity with air-fluid level in the more apical left lower lobe measuring 3.5 cm. The adjacent parenchyma is opacified by ground-glass opacity and consolidation. There is an adjacent thick walled collection within the pleural space which measures 11.4 cm in length by 4 cm in thickness. Appearance suggests empyema from necrotic pneumonia. Ill-defined increased soft tissue density in the left lower lobe that narrows the central left lower lobe airways, possible underlying obstructive mass. Background generalized airway thickening with mild emphysema. Upper Abdomen: No acute finding Musculoskeletal: None of the adjacent left ribs are eroded. No acute or aggressive finding. IMPRESSION: 1. 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 Prominent soft tissue density with airway narrowing in the central left lower lobe, possible underlying obstructive mass. No significant change compared to 01/20/2019. 2. COPD.  Electronically Signed   By: Monte Fantasia M.D.   On: 01/24/2019 05:37   Dg Chest Portable 1 View  Result Date: 01/24/2019 CLINICAL DATA:  Cough and fever EXAM: PORTABLE CHEST 1 VIEW COMPARISON:  Chest CT 01/20/2019 FINDINGS: Loculated pleural collection within the left hemithorax. Right lung is clear. Cardiac contours are normal. Left lower lobe cavitary mass poorly characterized. IMPRESSION: Loculated fluid collection within the left hemithorax, as previously demonstrated by CT. Cavitary mass of the left lower lobe is not clearly demonstrated this study. Electronically Signed   By: Ulyses Jarred M.D.   On: 01/24/2019 00:42     I have independently reviewed the above radiologic studies and discussed with the patient   Recent Lab Findings: Lab Results  Component Value Date   WBC 19.0 (H) 01/24/2019   HGB 9.4 (L) 01/24/2019   HCT 29.4 (L) 01/24/2019   PLT 584 (H) 01/24/2019   GLUCOSE 220 (H) 01/24/2019   ALT 18 01/24/2019   AST 19 01/24/2019   NA 130 (L) 01/24/2019   K 3.7 01/24/2019   CL 96 (L) 01/24/2019   CREATININE 0.62 01/24/2019   BUN 17 01/24/2019   CO2 24 01/24/2019   INR 1.2 01/23/2019   HGBA1C 11.0 (H) 01/24/2019      Assessment / Plan:     -53 year old diabetic smoker presents with 6-week history of productive cough, fever, fatigue, weight loss, and chest pain.  Diagnostic work-up thus far has included chest x-ray and a CT scan of the chest with the findings mentioned above.  This is very concerning for lung cancer with mediastinal metastasis in addition to an empyema likely resulting from a postobstructive pneumonia.  He was admitted with sepsis.  He is on broad-spectrum antibiotics.  Surgical plans were discussed with the patient briefly.  He is scheduled for bronchoscopy, video-assisted thoracoscopy, drainage of the empyema, and TEE in the OR tomorrow.  Will likely undertake further tissue biopsy as appropriate based on findings at the time of surgery.  Mr. Ploeger  expressed his interest in proceeding with surgery as soon as possible.  Dr. Prescott Gum will review his clinical data and history.  Further details of surgical plans will follow.  -History of hypertension-medications on hold due to relative hypotension  -Type 2 diabetes mellitus-poor control, admission A1c 11.  Glucose has been gradually improving throughout the day with sliding scale insulin coverage  -Hypothyroidism-on Synthroid  I  spent  25 minutes counseling the patient face to face.   Antony Odea, PA-C [phone]  01/24/2019 4:29 PM  Patient examined, images of CT scan of chest and chest x-rays personally reviewed and discussed with patient.  Agree with above assessment  by M. Roddenberry, PA-C  53 year old poorly controlled diabetic with significant smoking history presents with 1 month of pulmonary disease characterized by cough, fever, anorexia and weight loss, pleuritic chest pain, fatigue and shortness of breath.  CT scan shows a left lower lobe severe necrotizing pneumonia with empyema and possible necrotic lung parenchyma. He was admitted with fever and leukocytosis and has been placed on IV Zosyn.  He has history of necrotic teeth and old dental trauma which may be the origin of his pulmonary infection.  Patient is stable enough for left VATS to drain the empyema, decorticate the left lower lobe, and possibly resect necrotic lung tissue.  He understands the benefits of the procedure and that a combination of surgery and prolonged IV antibiotics will be required to successfully resolve his current pulmonary infection.  He understands the risk of bleeding, blood transfusion, postoperative ventilator requirement, postoperative organ failure and possible death.  The patient will we will plan on video bronchoscopy to assess for endobronchial lesions prior to VATS.   He agrees to proceed with surgery in the morning.

## 2019-01-24 NOTE — ED Notes (Signed)
Report attempted  X 1.   Nurse to call back.  Number given.

## 2019-01-24 NOTE — Consult Note (Signed)
NAME:  Gary Hudson, MRN:  [id number], DOB:  06/16/1966, LOS: 0 ADMISSION DATE:  01/23/2019, CONSULTATION DATE:  01/24/19 REFERRING MD:  Hal Hope - [facility], CHIEF COMPLAINT:  SOB, cough,   Brief History   53 yo with SOB and cough x  1 month, failed 2 rounds abx outpatient for PNA. CT with LLL cavitary lung mass.   History of present illness   53 yo M PMH tobacco abuse, HTN, DM2, Hypothyroidism presents with persistent cough and SOB. Patient states symptoms began approximately 1 month ago, with associated subjective fevers, chills and weight loss, and L sided pleuritic chest pain. Cough is productive, with blood tinged sputum. The patient is s/p 2 abx courses by PCP, most recent of which was completed 2 weeks ago. CT chest performed 5/29 showing LLL "cavitary lung mass. Denies recent travel, denies TB contacts. SARS CoV-2 negative. CXR reveals LLL effusion. CT chest reveals cavitary disease with presumed empyema and LLL soft tissue density, possibly representing obstructive mass.  The patient was admitted and started on empiric azothromycin and ceftriaxone. PCCM asked to consult.   Past Medical History  HLD HTN DM2 Hypothyroidism Tobacco use disorder  Significant Hospital Events   6/2 admitted for SOB, cough  Consults:  PCCM  Procedures:    Significant Diagnostic Tests:  CXR 6/1 L sided effusion, likely loculated.  CT chest 6/2 LLL cavitary disease with consolidation, presumed empyema. LLL soft tissue density, possible obstructive mass.   Micro Data:  6/2 SARS CoV 2- negative 6/2 AFB sputum >>> 6/2 sputum cx   Antimicrobials:  Azithromycin 6/2 >> Ceftriaxone 6/2>>   Interim history/subjective:  Admitted to hospital  No acute events overnight   Objective   Blood pressure 111/75, pulse 83, temperature 99.6 F (37.6 C), temperature source Oral, resp. rate (!) 23, height 5\' 11"  (1.803 m), weight 81.6 kg, SpO2 98 %.       No intake or output data in the 24 hours ending  01/24/19 0858 Filed Weights   01/23/19 2320  Weight: 81.6 kg    Examination: General: WDWN adult male, supine in bed, NAD HENT: NCAT, pink mmm, anicteric sclera, trachea midline  Lungs: Diminished L basilar breath sounds. Symmetrical chest expansion. No accessory muscle recruitment on RA Cardiovascular: RRR s1s2 no rgm No JVD Abdomen: Soft, ndnt. Normoactive x4  Extremities: Symmetrical bulk and tone, no obvious joint deformity. No cyanosis or clubbing Neuro: AAOx4. Following commands. Appropriate for age and situation GU: due to void   Resolved Hospital Problem list     Assessment & Plan:   LLL cavitary lung lesion -CT chest with presumed empyema  -Patient does have significant smoking hx (4ppd x 30years); reports quitting 2 weeks ago -EtOH history (4-8 drinks/day x 30 years, quit >87month ago). Endorses some n/v approx 1 week after cessation of EtOH use -s/p 2 courses of outpatient abx for presumed PNA  P Agree with empiric abx per primary Follow up sputum cx, AFB Discussed with PCCM attending. He will see shortly. Will make NPO at midnight, for bronch 6/3    Best practice:  Diet: Npo at midnight Pain/Anxiety/Delirium protocol (if indicated): APAP VAP protocol (if indicated): n/a DVT prophylaxis: SCD GI prophylaxis: n/a Glucose control: insulin gtt Code Status: Full  Family Communication: Patient updated Disposition: 6E   Labs   CBC: Recent Labs  Lab 01/23/19 2351 01/24/19 0504  WBC 23.8* 19.0*  NEUTROABS 19.0* 14.7*  HGB 10.7* 9.4*  HCT 33.4* 29.4*  MCV 85.4 86.0  PLT 734*  584*    Basic Metabolic Panel: Recent Labs  Lab 01/23/19 2351 01/24/19 0504  NA 123* 130*  K 4.7 4.2  CL 82* 91*  CO2 25 23  GLUCOSE 432* 348*  BUN 22* 18  CREATININE 0.97 0.85  CALCIUM 8.8* 8.0*   GFR: Estimated Creatinine Clearance: 105.8 mL/min (by C-G formula based on SCr of 0.85 mg/dL). Recent Labs  Lab 01/23/19 2351 01/23/19 2352 01/24/19 0146 01/24/19 0504   PROCALCITON  --   --   --  3.78  WBC 23.8*  --   --  19.0*  LATICACIDVEN  --  2.5* 1.4 1.2    Liver Function Tests: Recent Labs  Lab 01/23/19 2351 01/24/19 0504  AST 21 19  ALT 21 18  ALKPHOS 231* 176*  BILITOT 1.1 1.1  PROT 7.6 6.2*  ALBUMIN 2.3* 1.8*   No results for input(s): LIPASE, AMYLASE in the last 168 hours. No results for input(s): AMMONIA in the last 168 hours.  ABG No results found for: PHART, PCO2ART, PO2ART, HCO3, TCO2, ACIDBASEDEF, O2SAT   Coagulation Profile: Recent Labs  Lab 01/23/19 2351  INR 1.2    Cardiac Enzymes: No results for input(s): CKTOTAL, CKMB, CKMBINDEX, TROPONINI in the last 168 hours.  HbA1C: Hgb A1c MFr Bld  Date/Time Value Ref Range Status  01/24/2019 05:04 AM 11.0 (H) 4.8 - 5.6 % Final    Comment:    (NOTE) Pre diabetes:          5.7%-6.4% Diabetes:              >6.4% Glycemic control for   <7.0% adults with diabetes     CBG: Recent Labs  Lab 01/24/19 0619  GLUCAP 322*    Review of Systems:   As per HPI  Past Medical History  He,  has a past medical history of Diabetes mellitus without complication ([place]).   Surgical History   History reviewed. No pertinent surgical history.   Social History   reports that he has quit smoking. He has never used smokeless tobacco. He reports that he does not drink alcohol or use drugs.   Family History   His family history includes CAD in his father. There is no history of Diabetes Mellitus II.   Allergies No Known Allergies   Home Medications  Prior to Admission medications   Medication Sig Start Date End Date Taking? Authorizing Provider  atorvastatin (LIPITOR) 20 MG tablet  04/13/17   [provider]  glimepiride (AMARYL) 4 MG tablet  04/13/17   [provider]  levothyroxine (SYNTHROID, LEVOTHROID) 25 MCG tablet  04/13/17   [provider]  losartan-hydrochlorothiazide Konrad Penta) 50-12.5 MG tablet  04/13/17   [provider]  metFORMIN  (GLUCOPHAGE) 500 MG tablet  04/13/17   [provider]  Vitamin D, Ergocalciferol, (DRISDOL) 50000 units CAPS capsule  03/02/17   [provider]       Eliseo Gum MSN, AGACNP-BC [place] [phone] If no answer, [phone] 01/24/2019, 8:58 AM

## 2019-01-24 NOTE — ED Provider Notes (Signed)
[facility] DEPARTMENT Provider Note   CSN: [id number] Arrival date & time: 01/23/19  2311    History   Chief Complaint Chief Complaint  Patient presents with  . Shortness of Breath  . Cough    HPI Gary Hudson is a 53 y.o. male with a hx of HTN, COPD, IDDM presents to the Emergency Department complaining of gradual, persistent, progressively worsening SOB onset 1 month ago.  Patient reports he has been working with his physician outpatient to treat this in an attempt to avoid the hospital.  Outpatient work-up as below.  He has had multiple courses of antibiotics and a CT scan that shows a cavitary lesion with mediastinal lymphadenopathy.  I suspect this is lung cancer.  Patient is a current smoker.  He does report fevers to 103 at home.  He states 30 pound weight loss in the last 1 month.  He does endorse some night sweats.  Patient reports his shortness of breath became significantly worse 24 hours ago.  He reports vomiting with every meal for approximately 1 week but denies abdominal pain.  Patient denies headache, neck pain, syncope, dysuria.  Does have associated chest pain, fatigue and generalized weakness.  Pt presents with [facility] from [facility].  Pt was initially seen on 12/21/2018 for cough and fatigue.  COVID test neg at that time.  Worsening shortness of breath and to given zithromax and steroids on 12/28/2018 for possible CPOD exacerbation.  Symptoms persisted and pt was given Levaquin. On 01/10/2019 CXR showed lewft lower lobe cavitary mass.  F/U CT on 01/20/2019 showed worsening cavitary mass with pleural effusion on the left.      The history is provided by the patient and medical records. No language interpreter was used.    Past Medical History:  Diagnosis Date  . Diabetes mellitus without complication ([place])     There are no active problems to display for this patient.   History reviewed. No pertinent surgical  history.      Home Medications    Prior to Admission medications   Medication Sig Start Date End Date Taking? Authorizing Provider  atorvastatin (LIPITOR) 20 MG tablet  04/13/17   [provider]  glimepiride (AMARYL) 4 MG tablet  04/13/17   [provider]  levothyroxine (SYNTHROID, LEVOTHROID) 25 MCG tablet  04/13/17   [provider]  losartan-hydrochlorothiazide Konrad Penta) 50-12.5 MG tablet  04/13/17   [provider]  metFORMIN (GLUCOPHAGE) 500 MG tablet  04/13/17   [provider]  Vitamin D, Ergocalciferol, (DRISDOL) 50000 units CAPS capsule  03/02/17   [provider]    Family History History reviewed. No pertinent family history.  Social History Social History   Tobacco Use  . Smoking status: Former Smoker  Substance Use Topics  . Alcohol use: Never    Frequency: Never  . Drug use: Never     Allergies   Patient has no known allergies.   Review of Systems Review of Systems  Constitutional: Positive for activity change, appetite change, fatigue, fever and unexpected weight change. Negative for diaphoresis.  HENT: Negative for congestion and mouth sores.   Eyes: Negative for visual disturbance.  Respiratory: Positive for cough, chest tightness and shortness of breath. Negative for wheezing.   Cardiovascular: Positive for chest pain.  Gastrointestinal: Positive for nausea and vomiting. Negative for abdominal pain, constipation and diarrhea.  Endocrine: Negative for polydipsia, polyphagia and polyuria.  Genitourinary: Negative for dysuria, frequency, hematuria and  urgency.  Musculoskeletal: Negative for back pain and neck stiffness.  Skin: Negative for rash.  Allergic/Immunologic: Negative for immunocompromised state.  Neurological: Negative for syncope, light-headedness and headaches.  Hematological: Does not bruise/bleed easily.  Psychiatric/Behavioral: Negative for sleep disturbance. The patient is not  nervous/anxious.      Physical Exam Updated Vital Signs BP 96/72 (BP Location: Left Arm)   Pulse (!) 121   Temp 99.6 F (37.6 C) (Oral)   Resp 18   Ht 5\' 11"  (1.803 m)   Wt 81.6 kg   SpO2 96%   BMI 25.10 kg/m   Physical Exam Vitals signs and nursing note reviewed.  Constitutional:      General: He is not in acute distress.    Appearance: He is not diaphoretic.  HENT:     Head: Normocephalic.  Eyes:     General: No scleral icterus.    Conjunctiva/sclera: Conjunctivae normal.  Neck:     Musculoskeletal: Normal range of motion.  Cardiovascular:     Rate and Rhythm: Regular rhythm. Tachycardia present.     Pulses: Normal pulses.          Radial pulses are 2+ on the right side and 2+ on the left side.  Pulmonary:     Effort: Tachypnea and accessory muscle usage (mild) present. No prolonged expiration or retractions.     Breath sounds: No stridor.     Comments: Equal chest rise. Increased work of breathing. Coarse breath sounds throughout all fields Abdominal:     General: There is no distension.     Palpations: Abdomen is soft.     Tenderness: There is no abdominal tenderness. There is no guarding or rebound.  Musculoskeletal:     Comments: Moves all extremities equally and without difficulty. No calf tenderness  Skin:    General: Skin is warm and dry.     Capillary Refill: Capillary refill takes less than 2 seconds.  Neurological:     Mental Status: He is alert.     GCS: GCS eye subscore is 4. GCS verbal subscore is 5. GCS motor subscore is 6.     Comments: Speech is clear and goal oriented.  Psychiatric:        Mood and Affect: Mood normal.      ED Treatments / Results  Labs (all labs ordered are listed, but only abnormal results are displayed) Labs Reviewed  COMPREHENSIVE METABOLIC PANEL - Abnormal; Notable for the following components:      Result Value   Sodium 123 (*)    Chloride 82 (*)    Glucose, Bld 432 (*)    BUN 22 (*)    Calcium 8.8 (*)     Albumin 2.3 (*)    Alkaline Phosphatase 231 (*)    Anion gap 16 (*)    All other components within normal limits  LACTIC ACID, PLASMA - Abnormal; Notable for the following components:   Lactic Acid, Venous 2.5 (*)    All other components within normal limits  CBC WITH DIFFERENTIAL/PLATELET - Abnormal; Notable for the following components:   WBC 23.8 (*)    RBC 3.91 (*)    Hemoglobin 10.7 (*)    HCT 33.4 (*)    Platelets 734 (*)    Neutro Abs 19.0 (*)    Monocytes Absolute 2.2 (*)    Abs Immature Granulocytes 0.26 (*)    All other components within normal limits  PROTIME-INR - Abnormal; Notable for the following components:   Prothrombin Time  15.3 (*)    All other components within normal limits  URINALYSIS, ROUTINE W REFLEX MICROSCOPIC - Abnormal; Notable for the following components:   APPearance HAZY (*)    Glucose, UA >=500 (*)    Ketones, ur 20 (*)    Protein, ur 30 (*)    All other components within normal limits  CULTURE, BLOOD (ROUTINE X 2)  CULTURE, BLOOD (ROUTINE X 2)  SARS CORONAVIRUS 2 (HOSPITAL ORDER, [place] [facility])  LACTIC ACID, PLASMA    EKG EKG Interpretation  Date/Time:  Monday January 23 2019 23:17:44 EDT Ventricular Rate:  118 PR Interval:  124 QRS Duration: 94 QT Interval:  334 QTC Calculation: 468 R Axis:   69 Text Interpretation:  Sinus tachycardia Nonspecific T wave abnormality Abnormal ECG No old tracing to compare Confirmed by Delora Fuel ([id number]) on 01/24/2019 1:19:37 AM   Radiology Dg Chest Portable 1 View  Result Date: 01/24/2019 CLINICAL DATA:  Cough and fever EXAM: PORTABLE CHEST 1 VIEW COMPARISON:  Chest CT 01/20/2019 FINDINGS: Loculated pleural collection within the left hemithorax. Right lung is clear. Cardiac contours are normal. Left lower lobe cavitary mass poorly characterized. IMPRESSION: Loculated fluid collection within the left hemithorax, as previously demonstrated by CT. Cavitary mass of the left lower lobe is  not clearly demonstrated this study. Electronically Signed   By: Ulyses Jarred M.D.   On: 01/24/2019 00:42    Procedures .Critical Care Performed by: Abigail Butts, PA-C Authorized by: Abigail Butts, PA-C   Critical care provider statement:    Critical care time (minutes):  45   Critical care time was exclusive of:  Separately billable procedures and treating other patients and teaching time   Critical care was necessary to treat or prevent imminent or life-threatening deterioration of the following conditions:  Sepsis   Critical care was time spent personally by me on the following activities:  Discussions with consultants, evaluation of patient's response to treatment, examination of patient, ordering and performing treatments and interventions, ordering and review of laboratory studies, ordering and review of radiographic studies, pulse oximetry, re-evaluation of patient's condition, obtaining history from patient or surrogate and review of old charts   I assumed direction of critical care for this patient from another provider in my specialty: no     (including critical care time)  Medications Ordered in ED Medications  sodium chloride flush (NS) 0.9 % injection 3 mL (has no administration in time range)  sodium chloride 0.9 % bolus 1,000 mL (1,000 mLs Intravenous New Bag/Given 01/24/19 0146)    And  sodium chloride 0.9 % bolus 1,000 mL (1,000 mLs Intravenous New Bag/Given 01/24/19 0158)    And  sodium chloride 0.9 % bolus 500 mL (500 mLs Intravenous New Bag/Given 01/24/19 0159)  cefTRIAXone (ROCEPHIN) 2 g in sodium chloride 0.9 % 100 mL IVPB (2 g Intravenous New Bag/Given 01/24/19 0201)  azithromycin (ZITHROMAX) 500 mg in sodium chloride 0.9 % 250 mL IVPB (has no administration in time range)     Initial Impression / Assessment and Plan / ED Course  I have reviewed the triage vital signs and the nursing notes.  Pertinent labs & imaging results that were available during my  care of the patient were reviewed by me and considered in my medical decision making (see chart for details).  Clinical Course as of Jan 24 715  Tue Jan 24, 2019  0214 WBC(!): 23.8 [HM]  0214 anemia  Hemoglobin(!): 10.7 [HM]  0214 Noted  Abs  Immature Granulocytes(!): 0.26 [HM]  0214 Mild dehydration - pt reports not eating x1 week  Ketones, ur(!): 20 [HM]  0214 elevated  Glucose(!): 432 [HM]  0215 hyponatremic  Sodium(!): 123 [HM]  0215 elevated  Lactic Acid, Venous(!!): 2.5 [HM]  0215 Tachycardic on arrival and on my exam  Pulse Rate(!): 121 [HM]  0215 Tachypnea with increased work of breathing  Resp(!): 21 [HM]  0245 Loculated fluid collection within the left hemithorax - empyema vs metastatic.  I personally evaluated the images.  DG Chest Portable 1 View [HM]    Clinical Course User Index [HM] [place] Blizzard, Dolores, Mcgovern Sivertson was evaluated in Emergency Department on 01/24/2019 for the symptoms described in the history of present illness. He was evaluated in the context of the global COVID-19 pandemic, which necessitated consideration that the patient might be at risk for infection with the SARS-CoV-2 virus that causes COVID-19. Institutional protocols and algorithms that pertain to the evaluation of patients at risk for COVID-19 are in a state of rapid change based on information released by regulatory bodies including the CDC and federal and state organizations. These policies and algorithms were followed during the patient's care in the ED.  Pt presents with chest pain, shortness of breath, fatigue and weight loss.  He has been treated outpatient without improvement.  Several rounds of abx and steroids.  CT with cavitary lung mass concerning for cancer.  Other possibilities include Pneumonia, COVID and TB.  Pt with night sweats and weight loss.  He is tachycardic, tachypnea with significant leukocytosis.  Hyponatremia 2/2 dehydration vs possible SIADH from lung  cancer.  Sepsis order set utilized, fluids and abx given.  Pt will be admitted.    2:12 AM Discussed with Dr. Hal Hope who will admit.    The patient was discussed with and seen by Dr. Roxanne Mins who agrees with the treatment plan.    Final Clinical Impressions(s) / ED Diagnoses   Final diagnoses:  Sepsis, due to unspecified organism, unspecified whether acute organ dysfunction present [facility])  Cough  Other form of dyspnea    ED Discharge Orders    None       Loni Muse Gwenlyn Perking 27[birth year]86 [id number]    Delora Fuel, MD 92/01/00 [phone]

## 2019-01-24 NOTE — ED Notes (Signed)
ED TO INPATIENT HANDOFF REPORT  ED Nurse Name and Phone #: Celene Squibb RN  S Name/Age/Gender Gary Hudson 53 y.o. male Room/Bed: 018C/018C  Code Status   Code Status: Full Code  Home/SNF/Other Home Patient oriented to: self, place, time and situation Is this baseline? Yes   Triage Complete: Triage complete  Chief Complaint SOB  Triage Note Pt reports already being diagnosed with "lung infection" and having increase in sob, fever and cough. Mask on pt on arrival.    Allergies No Known Allergies  Level of Care/Admitting Diagnosis ED Disposition    ED Disposition Condition [facility] Area: [place] [[id number]]  Level of Care: Progressive [102]  Covid Evaluation: N/A  Diagnosis: Sepsis [facility]) [[id number]]  Admitting Physician: Rise Patience [phone]  Attending Physician: Rise Patience [phone]  Estimated length of stay: past midnight tomorrow  Certification:: I certify this patient will need inpatient services for at least 2 midnights  PT Class (Do Not Modify): Inpatient [101]  PT Acc Code (Do Not Modify): Private [1]       B Medical/Surgery History Past Medical History:  Diagnosis Date  . Diabetes mellitus without complication ([place])    History reviewed. No pertinent surgical history.   A IV Location/Drains/Wounds Patient Lines/Drains/Airways Status   Active Line/Drains/Airways    Name:   Placement date:   Placement time:   Site:   Days:   Peripheral IV 01/24/19 Left Hand   01/24/19    0131    Hand   less than 1          Intake/Output Last 24 hours No intake or output data in the 24 hours ending 01/24/19 9407  Labs/Imaging Results for orders placed or performed during the hospital encounter of 01/23/19 (from the past 48 hour(s))  Urinalysis, Routine w reflex microscopic     Status: Abnormal   Collection Time: 01/23/19 11:46 PM  Result Value Ref Range   Color, Urine YELLOW YELLOW   APPearance HAZY (A)  CLEAR   Specific Gravity, Urine 1.030 1.005 - 1.030   pH 5.0 5.0 - 8.0   Glucose, UA >=500 (A) NEGATIVE mg/dL   Hgb urine dipstick NEGATIVE NEGATIVE   Bilirubin Urine NEGATIVE NEGATIVE   Ketones, ur 20 (A) NEGATIVE mg/dL   Protein, ur 30 (A) NEGATIVE mg/dL   Nitrite NEGATIVE NEGATIVE   Leukocytes,Ua NEGATIVE NEGATIVE   RBC / HPF 0-5 0 - 5 RBC/hpf   WBC, UA 0-5 0 - 5 WBC/hpf   Bacteria, UA NONE SEEN NONE SEEN   Mucus PRESENT     Comment: Performed at [facility], [address], [place], [place] [address]  Comprehensive metabolic panel     Status: Abnormal   Collection Time: 01/23/19 11:51 PM  Result Value Ref Range   Sodium 123 (L) 135 - 145 mmol/L   Potassium 4.7 3.5 - 5.1 mmol/L   Chloride 82 (L) 98 - 111 mmol/L   CO2 25 22 - 32 mmol/L   Glucose, Bld 432 (H) 70 - 99 mg/dL   BUN 22 (H) 6 - 20 mg/dL   Creatinine, Ser 0.97 0.61 - 1.24 mg/dL   Calcium 8.8 (L) 8.9 - 10.3 mg/dL   Total Protein 7.6 6.5 - 8.1 g/dL   Albumin 2.3 (L) 3.5 - 5.0 g/dL   AST 21 15 - 41 U/L   ALT 21 0 - 44 U/L   Alkaline Phosphatase 231 (H) 38 - 126 U/L  Total Bilirubin 1.1 0.3 - 1.2 mg/dL   GFR calc non Af Amer >60 >60 mL/min   GFR calc Af Amer >60 >60 mL/min   Anion gap 16 (H) 5 - 15    Comment: Performed at [facility] [address] [place], [place] [address]  CBC with Differential     Status: Abnormal   Collection Time: 01/23/19 11:51 PM  Result Value Ref Range   WBC 23.8 (H) 4.0 - 10.5 K/uL    Comment: WHITE COUNT CONFIRMED ON SMEAR   RBC 3.91 (L) 4.22 - 5.81 MIL/uL   Hemoglobin 10.7 (L) 13.0 - 17.0 g/dL   HCT 33.4 (L) 39.0 - 52.0 %   MCV 85.4 80.0 - 100.0 fL   MCH 27.4 26.0 - 34.0 pg   MCHC 32.0 30.0 - 36.0 g/dL   RDW 13.5 11.5 - 15.5 %   Platelets 734 (H) 150 - 400 K/uL   nRBC 0.0 0.0 - 0.2 %   Neutrophils Relative % 80 %   Neutro Abs 19.0 (H) 1.7 - 7.7 K/uL   Lymphocytes Relative 10 %   Lymphs Abs 2.3 0.7 - 4.0 K/uL   Monocytes Relative 9 %   Monocytes Absolute 2.2  (H) 0.1 - 1.0 K/uL   Eosinophils Relative 0 %   Eosinophils Absolute 0.0 0.0 - 0.5 K/uL   Basophils Relative 0 %   Basophils Absolute 0.0 0.0 - 0.1 K/uL   Smear Review PLATELET CLUMPS SEEN ON SCAN    Immature Granulocytes 1 %   Abs Immature Granulocytes 0.26 (H) 0.00 - 0.07 K/uL    Comment: Performed at [facility], [place] [address], [place], [place] [address]  Protime-INR     Status: Abnormal   Collection Time: 01/23/19 11:51 PM  Result Value Ref Range   Prothrombin Time 15.3 (H) 11.4 - 15.2 seconds   INR 1.2 0.8 - 1.2    Comment: (NOTE) INR goal varies based on device and disease states. Performed at [facility], [place] [address] [place], [place] [address]   Lactic acid, plasma     Status: Abnormal   Collection Time: 01/23/19 11:52 PM  Result Value Ref Range   Lactic Acid, Venous 2.5 (HH) 0.5 - 1.9 mmol/L    Comment: CRITICAL RESULT CALLED TO, READ BACK BY AND VERIFIED WITH: S CRUZ RN 01/24/2019 AT 0052 BY Willy Eddy MT Performed at [facility], [place] [address] [place], [place] [address]   Lactic acid, plasma     Status: None   Collection Time: 01/24/19  1:46 AM  Result Value Ref Range   Lactic Acid, Venous 1.4 0.5 - 1.9 mmol/L    Comment: Performed at [facility] [address], [place], [place] [address]  SARS Coronavirus 2 (CEPHEID- Performed in [facility] hospital lab), Hosp Order     Status: None   Collection Time: 01/24/19  1:48 AM  Result Value Ref Range   SARS Coronavirus 2 NEGATIVE NEGATIVE    Comment: (NOTE) If result is NEGATIVE SARS-CoV-2 target nucleic acids are NOT DETECTED. The SARS-CoV-2 RNA is generally detectable in upper and lower  respiratory specimens during the acute phase of infection. The lowest  concentration of SARS-CoV-2 viral copies this assay can detect is 250  copies / mL. A negative result does not preclude SARS-CoV-2 infection  and should not be used as the sole basis for treatment or other  patient management  decisions.  A negative result  may occur with  improper specimen collection / handling, submission of specimen other  than nasopharyngeal swab, presence of viral mutation(s) within the  areas targeted by this assay, and inadequate number of viral copies  (<250 copies / mL). A negative result must be combined with clinical  observations, patient history, and epidemiological information. If result is POSITIVE SARS-CoV-2 target nucleic acids are DETECTED. The SARS-CoV-2 RNA is generally detectable in upper and lower  respiratory specimens dur ing the acute phase of infection.  Positive  results are indicative of active infection with SARS-CoV-2.  Clinical  correlation with patient history and other diagnostic information is  necessary to determine patient infection status.  Positive results do  not rule out bacterial infection or co-infection with other viruses. If result is PRESUMPTIVE POSTIVE SARS-CoV-2 nucleic acids MAY BE PRESENT.   A presumptive positive result was obtained on the submitted specimen  and confirmed on repeat testing.  While 2019 novel coronavirus  (SARS-CoV-2) nucleic acids may be present in the submitted sample  additional confirmatory testing may be necessary for epidemiological  and / or clinical management purposes  to differentiate between  SARS-CoV-2 and other Sarbecovirus currently known to infect humans.  If clinically indicated additional testing with an alternate test  methodology [id number]) is advised. The SARS-CoV-2 RNA is generally  detectable in upper and lower respiratory sp ecimens during the acute  phase of infection. The expected result is Negative. Fact Sheet for Patients:  StrictlyIdeas.no Fact Sheet for Healthcare Providers: BankingDealers.co.za This test is not yet approved or cleared by the [place] FDA and has been authorized for detection and/or diagnosis of SARS-CoV-2 by FDA under an  Emergency Use Authorization (EUA).  This EUA will remain in effect (meaning this test can be used) for the duration of the COVID-19 declaration under Section 564(b)(1) of the Act, 21 U.S.C. section 360bbb-3(b)(1), unless the authorization is terminated or revoked sooner. Performed at [facility], [place] [address] [place], [place] [address]   Hemoglobin A1c     Status: Abnormal   Collection Time: 01/24/19  5:04 AM  Result Value Ref Range   Hgb A1c MFr Bld 11.0 (H) 4.8 - 5.6 %    Comment: (NOTE) Pre diabetes:          5.7%-6.4% Diabetes:              >6.4% Glycemic control for   <7.0% adults with diabetes    Mean Plasma Glucose 269 mg/dL    Comment: Performed at [facility] [address], [place], [place] [address]  Basic metabolic panel     Status: Abnormal   Collection Time: 01/24/19  5:04 AM  Result Value Ref Range   Sodium 130 (L) 135 - 145 mmol/L    Comment: DELTA CHECK NOTED   Potassium 4.2 3.5 - 5.1 mmol/L   Chloride 91 (L) 98 - 111 mmol/L   CO2 23 22 - 32 mmol/L   Glucose, Bld 348 (H) 70 - 99 mg/dL   BUN 18 6 - 20 mg/dL   Creatinine, Ser 0.85 0.61 - 1.24 mg/dL   Calcium 8.0 (L) 8.9 - 10.3 mg/dL   GFR calc non Af Amer >60 >60 mL/min   GFR calc Af Amer >60 >60 mL/min   Anion gap 16 (H) 5 - 15    Comment: Performed at [facility], [place] [address] [place],  [address]  Hepatic function panel     Status: Abnormal   Collection  Time: 01/24/19  5:04 AM  Result Value Ref Range   Total Protein 6.2 (L) 6.5 - 8.1 g/dL   Albumin 1.8 (L) 3.5 - 5.0 g/dL   AST 19 15 - 41 U/L   ALT 18 0 - 44 U/L   Alkaline Phosphatase 176 (H) 38 - 126 U/L   Total Bilirubin 1.1 0.3 - 1.2 mg/dL   Bilirubin, Direct 0.2 0.0 - 0.2 mg/dL   Indirect Bilirubin 0.9 0.3 - 0.9 mg/dL    Comment: Performed at [facility] [address] [place], [place] [address]  CBC WITH DIFFERENTIAL     Status: Abnormal   Collection Time: 01/24/19  5:04 AM  Result Value Ref Range    WBC 19.0 (H) 4.0 - 10.5 K/uL   RBC 3.42 (L) 4.22 - 5.81 MIL/uL   Hemoglobin 9.4 (L) 13.0 - 17.0 g/dL   HCT 29.4 (L) 39.0 - 52.0 %   MCV 86.0 80.0 - 100.0 fL   MCH 27.5 26.0 - 34.0 pg   MCHC 32.0 30.0 - 36.0 g/dL   RDW 13.4 11.5 - 15.5 %   Platelets 584 (H) 150 - 400 K/uL   nRBC 0.0 0.0 - 0.2 %   Neutrophils Relative % 78 %   Neutro Abs 14.7 (H) 1.7 - 7.7 K/uL   Lymphocytes Relative 12 %   Lymphs Abs 2.2 0.7 - 4.0 K/uL   Monocytes Relative 9 %   Monocytes Absolute 1.8 (H) 0.1 - 1.0 K/uL   Eosinophils Relative 0 %   Eosinophils Absolute 0.0 0.0 - 0.5 K/uL   Basophils Relative 0 %   Basophils Absolute 0.0 0.0 - 0.1 K/uL   WBC Morphology MORPHOLOGY UNREMARKABLE    Immature Granulocytes 1 %   Abs Immature Granulocytes 0.19 (H) 0.00 - 0.07 K/uL    Comment: Performed at [facility], [address], [place], [place] [address]  Lactic acid, plasma     Status: None   Collection Time: 01/24/19  5:04 AM  Result Value Ref Range   Lactic Acid, Venous 1.2 0.5 - 1.9 mmol/L    Comment: Performed at [facility] [address], [place],  [address]  Procalcitonin - Baseline     Status: None   Collection Time: 01/24/19  5:04 AM  Result Value Ref Range   Procalcitonin 3.78 ng/mL    Comment:        Interpretation: PCT > 2 ng/mL: Systemic infection (sepsis) is likely, unless other causes are known. (NOTE)       Sepsis PCT Algorithm           Lower Respiratory Tract                                      Infection PCT Algorithm    ----------------------------     ----------------------------         PCT < 0.25 ng/mL                PCT < 0.10 ng/mL         Strongly encourage             Strongly discourage   discontinuation of antibiotics    initiation of antibiotics    ----------------------------     -----------------------------       PCT 0.25 - 0.50 ng/mL  PCT 0.10 - 0.25 ng/mL               OR       >80% decrease in PCT            Discourage initiation of                                             antibiotics      Encourage discontinuation           of antibiotics    ----------------------------     -----------------------------         PCT >= 0.50 ng/mL              PCT 0.26 - 0.50 ng/mL               AND       <80% decrease in PCT              Encourage initiation of                                             antibiotics       Encourage continuation           of antibiotics    ----------------------------     -----------------------------        PCT >= 0.50 ng/mL                  PCT > 0.50 ng/mL               AND         increase in PCT                  Strongly encourage                                      initiation of antibiotics    Strongly encourage escalation           of antibiotics                                     -----------------------------                                           PCT <= 0.25 ng/mL                                                 OR                                        > 80% decrease in PCT  Discontinue / Do not initiate                                             antibiotics Performed at [facility], [place] [address], [place], [place] [address]   CBG monitoring, ED     Status: Abnormal   Collection Time: 01/24/19  6:19 AM  Result Value Ref Range   Glucose-Capillary 322 (H) 70 - 99 mg/dL   Ct Chest W Contrast  Result Date: 01/24/2019 CLINICAL DATA:  Increasing shortness of breath. EXAM: CT CHEST WITH CONTRAST TECHNIQUE: Multidetector CT imaging of the chest was performed during intravenous contrast administration. CONTRAST:  35mL OMNIPAQUE IOHEXOL 300 MG/ML  SOLN COMPARISON:  01/20/2019 FINDINGS: Cardiovascular: Normal heart size. No pericardial effusion. There is aortic and coronary atherosclerosis. No acute vascular finding Mediastinum/Nodes: Enlarged left hilar and bronchial nodes measuring up to 16 mm short axis. Lungs/Pleura: Re- demonstrated cavity with  thick wall in the medial left lower lobe measuring 4.6 cm. There is also a cavity with air-fluid level in the more apical left lower lobe measuring 3.5 cm. The adjacent parenchyma is opacified by ground-glass opacity and consolidation. There is an adjacent thick walled collection within the pleural space which measures 11.4 cm in length by 4 cm in thickness. Appearance suggests empyema from necrotic pneumonia. Ill-defined increased soft tissue density in the left lower lobe that narrows the central left lower lobe airways, possible underlying obstructive mass. Background generalized airway thickening with mild emphysema. Upper Abdomen: No acute finding Musculoskeletal: None of the adjacent left ribs are eroded. No acute or aggressive finding. IMPRESSION: 1. 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 Prominent soft tissue density with airway narrowing in the central left lower lobe, possible underlying obstructive mass. No significant change compared to 01/20/2019. 2. COPD. Electronically Signed   By: Monte Fantasia M.D.   On: 01/24/2019 05:37   Dg Chest Portable 1 View  Result Date: 01/24/2019 CLINICAL DATA:  Cough and fever EXAM: PORTABLE CHEST 1 VIEW COMPARISON:  Chest CT 01/20/2019 FINDINGS: Loculated pleural collection within the left hemithorax. Right lung is clear. Cardiac contours are normal. Left lower lobe cavitary mass poorly characterized. IMPRESSION: Loculated fluid collection within the left hemithorax, as previously demonstrated by CT. Cavitary mass of the left lower lobe is not clearly demonstrated this study. Electronically Signed   By: Ulyses Jarred M.D.   On: 01/24/2019 00:42    Pending Labs Unresulted Labs (From admission, onward)    Start     Ordered   01/24/19 5361  Basic metabolic panel  Now then every 4 hours,   R     01/24/19 0626   01/24/19 0900  Troponin I - Once  Once,   R     01/24/19 0635   01/24/19 0637  Acid Fast Smear (AFB)  (AFB  smear + Culture w reflexed sensitivities panel)  Once,   R     01/24/19 0636   01/24/19 0637  Acid Fast Culture with reflexed sensitivities  (AFB smear + Culture w reflexed sensitivities panel)  Once,   R     01/24/19 0636   01/24/19 0418  Culture, sputum-assessment  Once,   R     01/24/19 0418   01/24/19 0418  Gram stain  Once,   R     01/24/19 0418  01/24/19 0418  Strep pneumoniae urinary antigen  Once,   R     01/24/19 0418   01/24/19 0418  Legionella Pneumophila Serogp 1 Ur Ag  Once,   R     01/24/19 0418   01/24/19 0417  Lactic acid, plasma  STAT Now then every 3 hours,   R     01/24/19 0416   01/24/19 0413  HIV antibody (Routine Testing)  Once,   R     01/24/19 0416   01/23/19 2346  Culture, blood (Routine x 2)  BLOOD CULTURE X 2,   STAT     01/23/19 2345          Vitals/Pain Today's Vitals   01/24/19 0500 01/24/19 0630 01/24/19 0645 01/24/19 0700  BP: 118/69 118/84 110/77 93/66  Pulse: 89  83 85  Resp: 12   (!) 21  Temp:      TempSrc:      SpO2: 96%  98% 97%  Weight:      Height:      PainSc:        Isolation Precautions Airborne precautions  Medications Medications  sodium chloride flush (NS) 0.9 % injection 3 mL (has no administration in time range)  cefTRIAXone (ROCEPHIN) 2 g in sodium chloride 0.9 % 100 mL IVPB (0 g Intravenous Stopped 01/24/19 0242)  azithromycin (ZITHROMAX) 500 mg in sodium chloride 0.9 % 250 mL IVPB (0 mg Intravenous Stopped 01/24/19 0435)  atorvastatin (LIPITOR) tablet 20 mg (has no administration in time range)  levothyroxine (SYNTHROID) tablet 25 mcg (25 mcg Oral Given 01/24/19 0620)  acetaminophen (TYLENOL) tablet 650 mg (has no administration in time range)    Or  acetaminophen (TYLENOL) suppository 650 mg (has no administration in time range)  ondansetron (ZOFRAN) tablet 4 mg (has no administration in time range)    Or  ondansetron (ZOFRAN) injection 4 mg (has no administration in time range)  0.9 %  sodium chloride infusion (  Intravenous New Bag/Given 01/24/19 0443)  dextrose 5 %-0.45 % sodium chloride infusion (has no administration in time range)  insulin regular bolus via infusion 0-10 Units (has no administration in time range)  insulin regular, human (MYXREDLIN) 100 units/ 100 mL infusion (2.6 Units/hr Intravenous New Bag/Given 01/24/19 0651)  dextrose 50 % solution 25 mL (has no administration in time range)  0.9 %  sodium chloride infusion (has no administration in time range)  sodium chloride 0.9 % bolus 1,000 mL (0 mLs Intravenous Stopped 01/24/19 0242)    And  sodium chloride 0.9 % bolus 1,000 mL (0 mLs Intravenous Stopped 01/24/19 0435)    And  sodium chloride 0.9 % bolus 500 mL (0 mLs Intravenous Stopped 01/24/19 0435)  iohexol (OMNIPAQUE) 300 MG/ML solution 75 mL (75 mLs Intravenous Contrast Given 01/24/19 0510)    Mobility walks Low fall risk   Focused Assessments Cardiac Assessment Handoff:    No results found for: CKTOTAL, CKMB, CKMBINDEX, TROPONINI No results found for: DDIMER Does the Patient currently have chest pain? No      R Recommendations: See Admitting Provider Note  Report given to:   Additional Notes:

## 2019-01-24 NOTE — H&P (Signed)
History and Physical    Gary Hudson QZR:[id number] DOB: [birth year] DOA: 01/23/2019  PCP: Patient, No Pcp Per  Patient coming from: Home.  Chief Complaint: Shortness of breath and cough.  HPI: Gary Hudson is a 53 y.o. male with history of tobacco abuse, diabetes mellitus type 2, hypertension and hypothyroidism has been experiencing shortness of breath with productive cough subjective feeling of fever chills over the last 1 month.  Has been having pleuritic type of chest pain on the left side.  Has been checked for COVID-19 which was negative as per the patient.  Had taken 2 courses of antibiotics given by patient's primary care physician last one was over about 2 weeks ago.  Patient has been having productive cough with blood tinged sputum.  Denies any recent travel or contacts with tuberculosis.  Had a CT chest done around 4 days ago.  Patient due to persistent symptoms are advised to come to the ER.  ED Course: In the ER chest x-ray shows loculated effusion of the left side.  Patient was febrile tachycardic with labs showing blood sugar of 432 anion gap 16 sodium 123 creatinine 1.9 WBC count of 23.8 lactate was 2.5 which improved with fluid bolus per sepsis protocol.  EKG was showing sinus tachycardia.  Patient was started on empiric antibiotics for pneumonia after blood cultures were obtained.  COVID-19 was repeated which was negative.  Review of Systems: As per HPI, rest all negative.   Past Medical History:  Diagnosis Date  . Diabetes mellitus without complication ([place])     History reviewed. No pertinent surgical history.   reports that he has quit smoking. He has never used smokeless tobacco. He reports that he does not drink alcohol or use drugs.  No Known Allergies  Family History  Problem Relation Age of Onset  . CAD Father   . Diabetes Mellitus II Neg Hx     Prior to Admission medications   Medication Sig Start Date End Date Taking? Authorizing Provider   atorvastatin (LIPITOR) 20 MG tablet  04/13/17   [provider]  glimepiride (AMARYL) 4 MG tablet  04/13/17   [provider]  levothyroxine (SYNTHROID, LEVOTHROID) 25 MCG tablet  04/13/17   [provider]  losartan-hydrochlorothiazide Konrad Penta) 50-12.5 MG tablet  04/13/17   [provider]  metFORMIN (GLUCOPHAGE) 500 MG tablet  04/13/17   [provider]  Vitamin D, Ergocalciferol, (DRISDOL) 50000 units CAPS capsule  03/02/17   [provider]    Physical Exam: Vitals:   01/24/19 0200 01/24/19 0215 01/24/19 0230 01/24/19 0300  BP: 133/65 134/88 127/85 106/82  Pulse: 97 99 98 97  Resp: (!) 21 (!) 25 (!) 21   Temp:      TempSrc:      SpO2: 96% 96% 93% 94%  Weight:      Height:          Constitutional: Moderately built and nourished. Vitals:   01/24/19 0200 01/24/19 0215 01/24/19 0230 01/24/19 0300  BP: 133/65 134/88 127/85 106/82  Pulse: 97 99 98 97  Resp: (!) 21 (!) 25 (!) 21   Temp:      TempSrc:      SpO2: 96% 96% 93% 94%  Weight:      Height:       Eyes: Anicteric no pallor. ENMT: No discharge from the ears eyes nose or mouth. Neck: No mass felt.  No neck rigidity. Respiratory: No rhonchi patient. Cardiovascular: S1-S2 heard. Abdomen: Soft  nontender bowel sounds present. Musculoskeletal: No edema.  No joint effusion. Skin: No rash. Neurologic: Alert awake oriented to time place and person.  Moves all extremities. Psychiatric: Appears normal.  Normal affect.   Labs on Admission: I have personally reviewed following labs and imaging studies  CBC: Recent Labs  Lab 01/23/19 2351  WBC 23.8*  NEUTROABS 19.0*  HGB 10.7*  HCT 33.4*  MCV 85.4  PLT 154*   Basic Metabolic Panel: Recent Labs  Lab 01/23/19 2351  NA 123*  K 4.7  CL 82*  CO2 25  GLUCOSE 432*  BUN 22*  CREATININE 0.97  CALCIUM 8.8*   GFR: Estimated Creatinine Clearance: 92.7 mL/min (by C-G formula based on SCr of 0.97 mg/dL). Liver Function  Tests: Recent Labs  Lab 01/23/19 2351  AST 21  ALT 21  ALKPHOS 231*  BILITOT 1.1  PROT 7.6  ALBUMIN 2.3*   No results for input(s): LIPASE, AMYLASE in the last 168 hours. No results for input(s): AMMONIA in the last 168 hours. Coagulation Profile: Recent Labs  Lab 01/23/19 2351  INR 1.2   Cardiac Enzymes: No results for input(s): CKTOTAL, CKMB, CKMBINDEX, TROPONINI in the last 168 hours. BNP (last 3 results) No results for input(s): PROBNP in the last 8760 hours. HbA1C: No results for input(s): HGBA1C in the last 72 hours. CBG: No results for input(s): GLUCAP in the last 168 hours. Lipid Profile: No results for input(s): CHOL, HDL, LDLCALC, TRIG, CHOLHDL, LDLDIRECT in the last 72 hours. Thyroid Function Tests: No results for input(s): TSH, T4TOTAL, FREET4, T3FREE, THYROIDAB in the last 72 hours. Anemia Panel: No results for input(s): VITAMINB12, FOLATE, FERRITIN, TIBC, IRON, RETICCTPCT in the last 72 hours. Urine analysis:    Component Value Date/Time   COLORURINE YELLOW 01/23/2019 2346   APPEARANCEUR HAZY (A) 01/23/2019 2346   LABSPEC 1.030 01/23/2019 2346   PHURINE 5.0 01/23/2019 2346   GLUCOSEU >=500 (A) 01/23/2019 2346   HGBUR NEGATIVE 01/23/2019 2346   BILIRUBINUR NEGATIVE 01/23/2019 2346   KETONESUR 20 (A) 01/23/2019 2346   PROTEINUR 30 (A) 01/23/2019 2346   NITRITE NEGATIVE 01/23/2019 2346   LEUKOCYTESUR NEGATIVE 01/23/2019 2346   Sepsis Labs: @LABRCNTIP (procalcitonin:4,lacticidven:4) ) Recent Results (from the past 240 hour(s))  SARS Coronavirus 2 (CEPHEID- Performed in [place] hospital lab), Hosp Order     Status: None   Collection Time: 01/24/19  1:48 AM  Result Value Ref Range Status   SARS Coronavirus 2 NEGATIVE NEGATIVE Final    Comment: (NOTE) If result is NEGATIVE SARS-CoV-2 target nucleic acids are NOT DETECTED. The SARS-CoV-2 RNA is generally detectable in upper and lower  respiratory specimens during the acute phase of infection. The  lowest  concentration of SARS-CoV-2 viral copies this assay can detect is 250  copies / mL. A negative result does not preclude SARS-CoV-2 infection  and should not be used as the sole basis for treatment or other  patient management decisions.  A negative result may occur with  improper specimen collection / handling, submission of specimen other  than nasopharyngeal swab, presence of viral mutation(s) within the  areas targeted by this assay, and inadequate number of viral copies  (<250 copies / mL). A negative result must be combined with clinical  observations, patient history, and epidemiological information. If result is POSITIVE SARS-CoV-2 target nucleic acids are DETECTED. The SARS-CoV-2 RNA is generally detectable in upper and lower  respiratory specimens dur ing the acute phase of infection.  Positive  results are indicative of active  infection with SARS-CoV-2.  Clinical  correlation with patient history and other diagnostic information is  necessary to determine patient infection status.  Positive results do  not rule out bacterial infection or co-infection with other viruses. If result is PRESUMPTIVE POSTIVE SARS-CoV-2 nucleic acids MAY BE PRESENT.   A presumptive positive result was obtained on the submitted specimen  and confirmed on repeat testing.  While 2019 novel coronavirus  (SARS-CoV-2) nucleic acids may be present in the submitted sample  additional confirmatory testing may be necessary for epidemiological  and / or clinical management purposes  to differentiate between  SARS-CoV-2 and other Sarbecovirus currently known to infect humans.  If clinically indicated additional testing with an alternate test  methodology [phone]) is advised. The SARS-CoV-2 RNA is generally  detectable in upper and lower respiratory sp ecimens during the acute  phase of infection. The expected result is Negative. Fact Sheet for Patients:  StrictlyIdeas.no  Fact Sheet for Healthcare Providers: BankingDealers.co.za This test is not yet approved or cleared by the [place] FDA and has been authorized for detection and/or diagnosis of SARS-CoV-2 by FDA under an Emergency Use Authorization (EUA).  This EUA will remain in effect (meaning this test can be used) for the duration of the COVID-19 declaration under Section 564(b)(1) of the Act, 21 U.S.C. section 360bbb-3(b)(1), unless the authorization is terminated or revoked sooner. Performed at [facility], [place] [address] [place], [place] [address]      Radiological Exams on Admission: Dg Chest Portable 1 View  Result Date: 01/24/2019 CLINICAL DATA:  Cough and fever EXAM: PORTABLE CHEST 1 VIEW COMPARISON:  Chest CT 01/20/2019 FINDINGS: Loculated pleural collection within the left hemithorax. Right lung is clear. Cardiac contours are normal. Left lower lobe cavitary mass poorly characterized. IMPRESSION: Loculated fluid collection within the left hemithorax, as previously demonstrated by CT. Cavitary mass of the left lower lobe is not clearly demonstrated this study. Electronically Signed   By: Ulyses Jarred M.D.   On: 01/24/2019 00:42    EKG: Independently reviewed.  Sinus tachycardia.  Assessment/Plan Principal Problem:   Sepsis ([place]) Active Problems:   Loculated pleural effusion   Uncontrolled type 2 diabetes mellitus with hyperglycemia ([place])   Essential hypertension    1. Sepsis secondary to pneumonia with left loculated effusion with cavitary lesion and mass seen on recent CAT scan -continue with empiric antibiotics.  I have consulted pulmonary critical care.  Repeating CAT scan.  Continue with fluids sepsis protocol.  Follow lactate procalcitonin.  We will keep patient on airborne precaution given the cavitary lesion check for sputum AFB. 2. Diabetes mellitus type 2 uncontrolled in DKA given anion gap is elevated -we will keep patient on IV insulin infusion  if the repeat metabolic panel shows persistently elevated anion gap.  Check hemoglobin Y3K follow metabolic panel closely. 3. Hypertension -we will hold antihypertensives since patient's blood pressure was in the low normal on presentation we will keep patient on PRN IV hydralazine. 4. Normocytic normochromic anemia -patient states he has been coughing significant blood-tinged sputum for last 1 month.  Could be from blood loss.  Follow CBC check anemia panel during next blood draw. 5. Hypothyroidism on Synthroid. 6. Tobacco abuse advised about quitting.  Patient states he quit 3 weeks ago. 7. Hyperlipidemia on statins.  Troponin is pending.   DVT prophylaxis: SCDs in anticipation of possible procedure. Code Status: Full code. Family Communication: Discussed with patient. Disposition Plan: Home. Consults called: Pulmonary critical care. Admission status: Inpatient.  Rise Patience MD [facility] Pager [phone].  If 7PM-7AM, please contact night-coverage [email or web address] Password [facility]  01/24/2019, 4:17 AM

## 2019-01-25 ENCOUNTER — Encounter (HOSPITAL_COMMUNITY)
Admission: EM | Disposition: A | Discharge status: Home / Self Care | Payer: Self-pay | Source: Home / Self Care | Attending: Internal Medicine

## 2019-01-25 ENCOUNTER — Encounter (HOSPITAL_COMMUNITY): Payer: Self-pay | Admitting: Certified Registered"

## 2019-01-25 ENCOUNTER — Encounter (HOSPITAL_COMMUNITY): Payer: 59

## 2019-01-25 LAB — BASIC METABOLIC PANEL
Anion gap: 14 (ref 5–15)
BUN: 13 mg/dL (ref 6–20)
CO2: 22 mmol/L (ref 22–32)
Calcium: 8.1 mg/dL — ABNORMAL LOW (ref 8.9–10.3)
Chloride: 94 mmol/L — ABNORMAL LOW (ref 98–111)
Creatinine, Ser: 0.8 mg/dL (ref 0.61–1.24)
GFR calc Af Amer: >60 mL/min (ref >60)
GFR calc non Af Amer: >60 mL/min (ref >60)
Glucose, Bld: 214 mg/dL — ABNORMAL HIGH (ref 70–99)
Potassium: 3.7 mmol/L (ref 3.5–5.1)
Sodium: 130 mmol/L — ABNORMAL LOW (ref 135–145)

## 2019-01-25 LAB — GLUCOSE, CAPILLARY
Glucose-Capillary: 231 mg/dL — ABNORMAL HIGH (ref 70–99)
Glucose-Capillary: 198 mg/dL — ABNORMAL HIGH (ref 70–99)
Glucose-Capillary: 224 mg/dL — ABNORMAL HIGH (ref 70–99)
Glucose-Capillary: 184 mg/dL — ABNORMAL HIGH (ref 70–99)

## 2019-01-25 LAB — CBC
HCT: 29 % — ABNORMAL LOW (ref 39.0–52.0)
Hemoglobin: 9.4 g/dL — ABNORMAL LOW (ref 13.0–17.0)
MCH: 27.6 pg (ref 26.0–34.0)
MCHC: 32.4 g/dL (ref 30.0–36.0)
MCV: 85.3 fL (ref 80.0–100.0)
Platelets: 595 10*3/uL — ABNORMAL HIGH (ref 150–400)
RBC: 3.4 MIL/uL — ABNORMAL LOW (ref 4.22–5.81)
RDW: 13.6 % (ref 11.5–15.5)
WBC: 14.3 10*3/uL — ABNORMAL HIGH (ref 4.0–10.5)
nRBC: 0 % (ref 0.0–0.2)

## 2019-01-25 LAB — TSH: TSH: 2.88 u[IU]/mL (ref 0.350–4.500)

## 2019-01-25 LAB — HEMOGLOBIN A1C
Hgb A1c MFr Bld: 11.4 % — ABNORMAL HIGH (ref 4.8–5.6)
Mean Plasma Glucose: 280.48 mg/dL

## 2019-01-25 LAB — MAGNESIUM: Magnesium: 1.9 mg/dL (ref 1.7–2.4)

## 2019-01-25 SURGERY — VIDEO BRONCHOSCOPY WITHOUT FLUORO
Anesthesia: Moderate Sedation | Laterality: Bilateral

## 2019-01-25 SURGERY — BRONCHOSCOPY, VIDEO-ASSISTED
Anesthesia: General | Site: Chest

## 2019-01-25 MED ORDER — INSULIN GLARGINE 100 UNIT/ML ~~LOC~~ SOLN
6.0000 [IU] | Freq: Once | SUBCUTANEOUS | Status: AC
  Administered 2019-01-25: 6 [IU] via SUBCUTANEOUS
  Filled 2019-01-25: qty 0.06

## 2019-01-25 MED ORDER — INSULIN GLARGINE 100 UNIT/ML ~~LOC~~ SOLN
16.0000 [IU] | Freq: Every day | SUBCUTANEOUS | Status: DC
  Administered 2019-01-26 – 2019-01-31 (×6): 16 [IU] via SUBCUTANEOUS
  Filled 2019-01-25 (×7): qty 0.16

## 2019-01-25 MED ORDER — CEFAZOLIN SODIUM-DEXTROSE 2-4 GM/100ML-% IV SOLN: 2.0000 g | INTRAVENOUS | Status: DC

## 2019-01-25 MED ORDER — ENSURE ENLIVE PO LIQD
237.0000 mL | Freq: Two times a day (BID) | ORAL | Status: DC
  Administered 2019-01-26 – 2019-01-30 (×9): 237 mL via ORAL

## 2019-01-25 MED ORDER — TRAMADOL HCL 50 MG PO TABS
50.0000 mg | ORAL_TABLET | Freq: Four times a day (QID) | ORAL | Status: DC | PRN
  Administered 2019-01-25: 50 mg via ORAL
  Filled 2019-01-25: qty 1

## 2019-01-25 MED ORDER — ALBUTEROL SULFATE (2.5 MG/3ML) 0.083% IN NEBU
2.5000 mg | INHALATION_SOLUTION | RESPIRATORY_TRACT | Status: DC | PRN
  Administered 2019-01-25: 2.5 mg via RESPIRATORY_TRACT
  Filled 2019-01-25: qty 3

## 2019-01-25 MED ORDER — CHLORHEXIDINE GLUCONATE CLOTH 2 % EX PADS
6.0000 | MEDICATED_PAD | Freq: Every day | CUTANEOUS | Status: DC
  Administered 2019-01-26: 06:00:00 6 via TOPICAL

## 2019-01-25 MED ORDER — IPRATROPIUM-ALBUTEROL 20-100 MCG/ACT IN AERS
1.0000 | INHALATION_SPRAY | Freq: Four times a day (QID) | RESPIRATORY_TRACT | Status: DC
  Administered 2019-01-25 – 2019-01-26 (×3): 1 via RESPIRATORY_TRACT
  Filled 2019-01-25: qty 4

## 2019-01-25 NOTE — Progress Notes (Signed)
Initial Nutrition Assessment  INTERVENTION:   Provide Ensure Enlive po BID, each supplement provides 350 kcal and 20 grams of protein  NUTRITION DIAGNOSIS:   Increased nutrient needs related to chronic illness as evidenced by estimated needs.  GOAL:   Patient will meet greater than or equal to 90% of their needs  MONITOR:   PO intake, Supplement acceptance, Labs, Weight trends, I & O's  REASON FOR ASSESSMENT:   Malnutrition Screening Tool    ASSESSMENT:   53 y.o. male with history of tobacco abuse, diabetes mellitus type 2, hypertension and hypothyroidism has been experiencing shortness of breath with productive cough subjective feeling of fever chills over the last 1 month. Admitted with Sepsis secondary to pneumonia with left loculated effusion with cavitary lesion and mass seen on recent CAT scan  **RD working remotely**  Patient was NPO for multiple procedures by cardiacthoracic surgery, postponed for now. Pt is now on a diet. Will monitor PO intakes going forward. Pt with history of ETOH abuse, reports last drink >1 month ago. Pt would benefit from nutritional supplementation, will order Ensure.  Per patient he has lost 40 lbs starting about 6 weeks ago. This would be significant for time frame. Unable to verify as there are no weight records beyond this admission in chart.  Medications reviewed. Labs reviewed: CBGs: 198-231 Low Na  NUTRITION - FOCUSED PHYSICAL EXAM:  Unable to perform per department requirements to work remotely.  Diet Order:   Diet Order            Diet Carb Modified Fluid consistency: Thin; Room service appropriate? Yes  Diet effective now              EDUCATION NEEDS:   No education needs have been identified at this time  Skin:  Skin Assessment: Reviewed RN Assessment  Last BM:  6/1  Height:   Ht Readings from Last 1 Encounters:  01/24/19 5\' 11"  (1.803 m)    Weight:   Wt Readings from Last 1 Encounters:  01/25/19 82.7 kg     Ideal Body Weight:  78.1 kg  BMI:  Body mass index is 25.43 kg/m.  Estimated Nutritional Needs:   Kcal:  2100-2300  Protein:  100-110g  Fluid:  2.1L/day  Clayton Bibles, MS, RD, LDN [place] [facility] Pager: [phone] After Hours Pager: [phone]

## 2019-01-25 NOTE — Progress Notes (Signed)
PROGRESS NOTE    Gary Hudson  ZOX:[id number] DOB: [birth year] DOA: 01/23/2019 PCP: Patient, No Pcp Per   Brief Narrative:  53 year old male with a past medical history of tobacco use, diabetes mellitus type 2, essential hypertension, hypothyroidism came to the hospital with exertional shortness of breath with productive cough.  COVID was negative.  Chest x-ray showed concerns for a loculated effusion on the left side.CT scan showed worsening of cavitary mass with left-sided pleural effusion.  Cardiothoracic and pulmonary team were consulted pulmonary team recommended to check for TB with QuantiFERON.  Antibiotics were changed to Zosyn.  Cardiothoracic scheduled for decortication and VATS procedure.  Initially was also in DKA transitioned to subcutaneous insulin.   Assessment & Plan:   Principal Problem:   Sepsis ([place]) Active Problems:   Loculated pleural effusion   Uncontrolled type 2 diabetes mellitus with hyperglycemia (HCC)   Essential hypertension  Sepsis secondary to left-sided loculated effusion with cavitary pneumonia, present prior to admission -Continue IV Zosyn day 2.  Pulmonary input appreciated.Leukocytosis improving.  Bronchodilators/inhalers added -CTA of the chest reviewed. - CT surgery consulted-plans for decortication/VATS, postponed due to other emergency per surgery team -QuantiFERON test sent, if negative discontinue airborne precaution.  Diabetic ketoacidosis, resolved Uncontrolled diabetes mellitus type 2 - Increase subcutaneous Lantus to 16 units daily.  Insulin sliding scale and Accu-Chek.  Will add scheduled short acting insulin once p.o. intake is stable.  Currently she is n.p.o. - Closely monitor. -Hemoglobin A1c 11.4 - Diabetic coordinator following  Essential hypertension - IV as needed hydralazine.  Resume home p.o. meds when appropriate  Chronic normocytic anemia -Hemoglobin stable.  Continue to monitor  Hypothyroidism -Continue Synthroid.  TSH  within normal limits  Tobacco use - Claims quit several weeks ago  Hyperlipidemia - Continue statin   DVT prophylaxis: SCDs Code Status: Full code Family Communication: None at bedside Disposition Plan: To be determined  Consultants:   Critical care  Cardiothoracic surgery  Procedures:   Plans for VATS  Antimicrobials:   Zosyn day 2   Subjective: Reports of feeling little short of breath and generalized pain and achiness.  No other new complaints.  Continues to cough  Review of Systems Otherwise negative except as per HPI, including: General: Denies fever, chills, night sweats or unintended weight loss. Resp: Denies  wheezing Cardiac: Denies chest pain, palpitations, orthopnea, paroxysmal nocturnal dyspnea. GI: Denies abdominal pain, nausea, vomiting, diarrhea or constipation GU: Denies dysuria, frequency, hesitancy or incontinence MS: Denies muscle aches, joint pain or swelling Neuro: Denies headache, neurologic deficits (focal weakness, numbness, tingling), abnormal gait Psych: Denies anxiety, depression, SI/HI/AVH Skin: Denies new rashes or lesions ID: Denies sick contacts, exotic exposures, travel  Objective: Vitals:   01/24/19 0842 01/24/19 1916 01/25/19 0636 01/25/19 0811  BP: 123/62 112/65 123/74 121/79  Pulse:  94 86 92  Resp: 16  (!) 21 20  Temp: 98.2 F (36.8 C) 99.1 F (37.3 C) (!) 97.5 F (36.4 C) 98.1 F (36.7 C)  TempSrc: Oral Oral Oral Oral  SpO2: 97% 97% 99% 92%  Weight: 82.6 kg  82.7 kg   Height: 5\' 11"  (1.803 m)       Intake/Output Summary (Last 24 hours) at 01/25/2019 1136 Last data filed at 01/25/2019 0430 Gross per 24 hour  Intake 1795 ml  Output -  Net 1795 ml   Filed Weights   01/23/19 2320 01/24/19 0842 01/25/19 0636  Weight: 81.6 kg 82.6 kg 82.7 kg    Examination:  Constitutional: Slight distress  due to aches and pains Eyes: PERRL, lids and conjunctivae normal ENMT: Mucous membranes are moist. Posterior pharynx clear  of any exudate or lesions.Normal dentition.  Neck: normal, supple, no masses, no thyromegaly Respiratory: Coarse breath sounds Cardiovascular: Regular rate and rhythm, no murmurs / rubs / gallops. No extremity edema. 2+ pedal pulses. No carotid bruits.  Abdomen: no tenderness, no masses palpated. No hepatosplenomegaly. Bowel sounds positive.  Musculoskeletal: no clubbing / cyanosis. No joint deformity upper and lower extremities. Good ROM, no contractures. Normal muscle tone.  Skin: no rashes, lesions, ulcers. No induration Neurologic: CN 2-12 grossly intact. Sensation intact, DTR normal. Strength 4+/5 in all 4.  Psychiatric: Normal judgment and insight. Alert and oriented x 3. Normal mood.     Data Reviewed:   CBC: Recent Labs  Lab 01/23/19 2351 01/24/19 0504 01/25/19 0403  WBC 23.8* 19.0* 14.3*  NEUTROABS 19.0* 14.7*  --   HGB 10.7* 9.4* 9.4*  HCT 33.4* 29.4* 29.0*  MCV 85.4 86.0 85.3  PLT 734* 584* 259*   Basic Metabolic Panel: Recent Labs  Lab 01/24/19 0857 01/24/19 1328 01/24/19 1629 01/24/19 2107 01/25/19 0403  NA 129* 130* 131* 130* 130*  K 4.1 3.7 3.7 3.7 3.7  CL 94* 96* 97* 96* 94*  CO2 24 24 24 25 22   GLUCOSE 353* 220* 135* 150* 214*  BUN 16 17 14 13 13   CREATININE 0.71 0.62 0.58* 0.67 0.80  CALCIUM 8.0* 7.9* 7.9* 7.8* 8.1*  MG  --   --   --   --  1.9   GFR: Estimated Creatinine Clearance: 112.4 mL/min (by C-G formula based on SCr of 0.8 mg/dL). Liver Function Tests: Recent Labs  Lab 01/23/19 2351 01/24/19 0504  AST 21 19  ALT 21 18  ALKPHOS 231* 176*  BILITOT 1.1 1.1  PROT 7.6 6.2*  ALBUMIN 2.3* 1.8*   No results for input(s): LIPASE, AMYLASE in the last 168 hours. No results for input(s): AMMONIA in the last 168 hours. Coagulation Profile: Recent Labs  Lab 01/23/19 2351 01/24/19 2107  INR 1.2 1.3*   Cardiac Enzymes: Recent Labs  Lab 01/24/19 0857  TROPONINI <0.03   BNP (last 3 results) No results for input(s): PROBNP in the last  8760 hours. HbA1C: Recent Labs    01/24/19 0504 01/25/19 0403  HGBA1C 11.0* 11.4*   CBG: Recent Labs  Lab 01/24/19 1545 01/24/19 1634 01/24/19 2117 01/25/19 0756 01/25/19 1121  GLUCAP 143* 128* 147* 231* 198*   Lipid Profile: No results for input(s): CHOL, HDL, LDLCALC, TRIG, CHOLHDL, LDLDIRECT in the last 72 hours. Thyroid Function Tests: Recent Labs    01/25/19 0403  TSH 2.880   Anemia Panel: No results for input(s): VITAMINB12, FOLATE, FERRITIN, TIBC, IRON, RETICCTPCT in the last 72 hours. Sepsis Labs: Recent Labs  Lab 01/23/19 2352 01/24/19 0146 01/24/19 0504 01/24/19 0857  PROCALCITON  --   --  3.78  --   LATICACIDVEN 2.5* 1.4 1.2 1.5    Recent Results (from the past 240 hour(s))  Culture, blood (Routine x 2)     Status: None (Preliminary result)   Collection Time: 01/23/19 11:30 PM  Result Value Ref Range Status   Specimen Description BLOOD LEFT ARM  Final   Special Requests   Final    BOTTLES DRAWN AEROBIC AND ANAEROBIC Blood Culture results may not be optimal due to an excessive volume of blood received in culture bottles   Culture   Final    NO GROWTH 1 DAY Performed at  [facility], [place] [address], [place], [place] [address]    Report Status PENDING  Incomplete  Culture, blood (Routine x 2)     Status: None (Preliminary result)   Collection Time: 01/23/19 11:48 PM  Result Value Ref Range Status   Specimen Description BLOOD RIGHT ARM  Final   Special Requests   Final    BOTTLES DRAWN AEROBIC ONLY Blood Culture results may not be optimal due to an excessive volume of blood received in culture bottles   Culture   Final    NO GROWTH 1 DAY Performed at [facility], [place] [address], [place], [place] [address]    Report Status PENDING  Incomplete  SARS Coronavirus 2 (CEPHEID- Performed in [place] hospital lab), Hosp Order     Status: None   Collection Time: 01/24/19  1:48 AM  Result Value Ref Range Status   SARS Coronavirus 2  NEGATIVE NEGATIVE Final    Comment: (NOTE) If result is NEGATIVE SARS-CoV-2 target nucleic acids are NOT DETECTED. The SARS-CoV-2 RNA is generally detectable in upper and lower  respiratory specimens during the acute phase of infection. The lowest  concentration of SARS-CoV-2 viral copies this assay can detect is 250  copies / mL. A negative result does not preclude SARS-CoV-2 infection  and should not be used as the sole basis for treatment or other  patient management decisions.  A negative result may occur with  improper specimen collection / handling, submission of specimen other  than nasopharyngeal swab, presence of viral mutation(s) within the  areas targeted by this assay, and inadequate number of viral copies  (<250 copies / mL). A negative result must be combined with clinical  observations, patient history, and epidemiological information. If result is POSITIVE SARS-CoV-2 target nucleic acids are DETECTED. The SARS-CoV-2 RNA is generally detectable in upper and lower  respiratory specimens dur ing the acute phase of infection.  Positive  results are indicative of active infection with SARS-CoV-2.  Clinical  correlation with patient history and other diagnostic information is  necessary to determine patient infection status.  Positive results do  not rule out bacterial infection or co-infection with other viruses. If result is PRESUMPTIVE POSTIVE SARS-CoV-2 nucleic acids MAY BE PRESENT.   A presumptive positive result was obtained on the submitted specimen  and confirmed on repeat testing.  While 2019 novel coronavirus  (SARS-CoV-2) nucleic acids may be present in the submitted sample  additional confirmatory testing may be necessary for epidemiological  and / or clinical management purposes  to differentiate between  SARS-CoV-2 and other Sarbecovirus currently known to infect humans.  If clinically indicated additional testing with an alternate test  methodology [phone])  is advised. The SARS-CoV-2 RNA is generally  detectable in upper and lower respiratory sp ecimens during the acute  phase of infection. The expected result is Negative. Fact Sheet for Patients:  StrictlyIdeas.no Fact Sheet for Healthcare Providers: BankingDealers.co.za This test is not yet approved or cleared by the [place] FDA and has been authorized for detection and/or diagnosis of SARS-CoV-2 by FDA under an Emergency Use Authorization (EUA).  This EUA will remain in effect (meaning this test can be used) for the duration of the COVID-19 declaration under Section 564(b)(1) of the Act, 21 U.S.C. section 360bbb-3(b)(1), unless the authorization is terminated or revoked sooner. Performed at [facility], [place] [address], [place], [place] [address]   Surgical pcr screen     Status: None  Collection Time: 01/24/19  6:04 PM  Result Value Ref Range Status   MRSA, PCR NEGATIVE NEGATIVE Final   Staphylococcus aureus NEGATIVE NEGATIVE Final    Comment: (NOTE) The Xpert SA Assay (FDA approved for NASAL specimens in patients 34 years of age and older), is one component of a comprehensive surveillance program. It is not intended to diagnose infection nor to guide or monitor treatment. Performed at [facility], [place] [address], [place], [place] [address]          Radiology Studies: Ct Chest W Contrast  Result Date: 01/24/2019 CLINICAL DATA:  Increasing shortness of breath. EXAM: CT CHEST WITH CONTRAST TECHNIQUE: Multidetector CT imaging of the chest was performed during intravenous contrast administration. CONTRAST:  24mL OMNIPAQUE IOHEXOL 300 MG/ML  SOLN COMPARISON:  01/20/2019 FINDINGS: Cardiovascular: Normal heart size. No pericardial effusion. There is aortic and coronary atherosclerosis. No acute vascular finding Mediastinum/Nodes: Enlarged left hilar and bronchial nodes measuring up to 16 mm short axis. Lungs/Pleura:  Re- demonstrated cavity with thick wall in the medial left lower lobe measuring 4.6 cm. There is also a cavity with air-fluid level in the more apical left lower lobe measuring 3.5 cm. The adjacent parenchyma is opacified by ground-glass opacity and consolidation. There is an adjacent thick walled collection within the pleural space which measures 11.4 cm in length by 4 cm in thickness. Appearance suggests empyema from necrotic pneumonia. Ill-defined increased soft tissue density in the left lower lobe that narrows the central left lower lobe airways, possible underlying obstructive mass. Background generalized airway thickening with mild emphysema. Upper Abdomen: No acute finding Musculoskeletal: None of the adjacent left ribs are eroded. No acute or aggressive finding. IMPRESSION: 1. 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 Prominent soft tissue density with airway narrowing in the central left lower lobe, possible underlying obstructive mass. No significant change compared to 01/20/2019. 2. COPD. Electronically Signed   By: Monte Fantasia M.D.   On: 01/24/2019 05:37   Dg Chest Portable 1 View  Result Date: 01/24/2019 CLINICAL DATA:  Cough and fever EXAM: PORTABLE CHEST 1 VIEW COMPARISON:  Chest CT 01/20/2019 FINDINGS: Loculated pleural collection within the left hemithorax. Right lung is clear. Cardiac contours are normal. Left lower lobe cavitary mass poorly characterized. IMPRESSION: Loculated fluid collection within the left hemithorax, as previously demonstrated by CT. Cavitary mass of the left lower lobe is not clearly demonstrated this study. Electronically Signed   By: Ulyses Jarred M.D.   On: 01/24/2019 00:42        Scheduled Meds: . atorvastatin  20 mg Oral q1800  . chlorhexidine  15 mL Mouth/Throat QID  . insulin aspart  0-15 Units Subcutaneous TID WC  . insulin aspart  0-5 Units Subcutaneous QHS  . [START ON 01/26/2019] insulin glargine  16 Units  Subcutaneous Daily  . insulin glargine  6 Units Subcutaneous Once  . levothyroxine  25 mcg Oral Q0600  . sodium chloride flush  3 mL Intravenous Once   Continuous Infusions: . cefOXitin    . piperacillin-tazobactam (ZOSYN)  IV 3.375 g (01/25/19 0800)     LOS: 1 day   Time spent= 35 mins    Darina Hartwell Arsenio Loader, MD [facility]  If 7PM-7AM, please contact night-coverage [email or web address] 01/25/2019, 11:36 AM

## 2019-01-25 NOTE — Anesthesia Preprocedure Evaluation (Addendum)
Anesthesia Evaluation  Patient identified by MRN, date of birth, ID band Patient awake    Reviewed: Allergy & Precautions, NPO status , Patient's Chart, lab work & pertinent test results  History of Anesthesia Complications Negative for: history of anesthetic complications  Airway Mallampati: III  TM Distance: >3 FB Neck ROM: Full    Dental  (+) Dental Advisory Given, Loose, Poor Dentition,    Pulmonary pneumonia, unresolved, former smoker,   Pleural effusion     + decreased breath sounds      Cardiovascular hypertension, Pt. on medications  Rhythm:Regular Rate:Normal     Neuro/Psych negative neurological ROS  negative psych ROS   GI/Hepatic negative GI ROS, Neg liver ROS,   Endo/Other  diabetes, Poorly Controlled, Type 2, Oral Hypoglycemic AgentsHypothyroidism  Hyponatremia   Renal/GU negative Renal ROS     Musculoskeletal negative musculoskeletal ROS (+)   Abdominal   Peds  Hematology  (+) anemia ,  Thrombocytosis    Anesthesia Other Findings   Reproductive/Obstetrics                            Anesthesia Physical Anesthesia Plan  ASA: III  Anesthesia Plan: General   Post-op Pain Management:    Induction: Intravenous  PONV Risk Score and Plan: 3 and Treatment may vary due to age or medical condition, Ondansetron, Dexamethasone and Midazolam  Airway Management Planned: Double Lumen EBT  Additional Equipment: Arterial line and TEE  Intra-op Plan:   Post-operative Plan: Possible Post-op intubation/ventilation  Informed Consent: I have reviewed the patients History and Physical, chart, labs and discussed the procedure including the risks, benefits and alternatives for the proposed anesthesia with the patient or authorized representative who has indicated his/her understanding and acceptance.     Dental advisory given  Plan Discussed with: CRNA and  Anesthesiologist  Anesthesia Plan Comments: (Extensive discussion had regarding loose teeth and possibility of dental damage, especially considering need for double lumen EBT and request by surgeon for TEE to evaluate for valvular vegetations. Patient expressed understanding. 2 large bore peripheral IV's)       Anesthesia Quick Evaluation

## 2019-01-25 NOTE — Progress Notes (Signed)
Inpatient Diabetes Program Recommendations  AACE/ADA: New Consensus Statement on Inpatient Glycemic Control (2015)  Target Ranges:  Prepandial:   less than 140 mg/dL      Peak postprandial:   less than 180 mg/dL (1-2 hours)      Critically ill patients:  140 - 180 mg/dL   Lab Results  Component Value Date   GLUCAP 231 (H) 01/25/2019   HGBA1C 11.4 (H) 01/25/2019    Review of Glycemic Control Results for Gary Hudson, Gary Hudson (MRN [id number]) as of 01/25/2019 11:03  Ref. Range 01/25/2019 07:56  Glucose-Capillary Latest Ref Range: 70 - 99 mg/dL 231 (H)   Diabetes history: DM 2 Outpatient Diabetes medications: Amaryl 4 mg daily, Metformin 1000 mg q AM and 500  mg q PM Current orders for Inpatient glycemic control:  Lantus 10 units daily, Novolog moderate tid with meals and HS  Inpatient Diabetes Program Recommendations:    For surgery today.  May consider increasing Lantus to 16 units daily.  Also consider increasing Novolog correction to q 4 hours since patient is NPO.   Thanks,  Adah Perl, RN, BC-ADM Inpatient Diabetes Coordinator Pager [phone] (8a-5p)

## 2019-01-26 ENCOUNTER — Encounter (HOSPITAL_COMMUNITY)
Admission: EM | Disposition: A | Discharge status: Home / Self Care | Payer: Self-pay | Source: Home / Self Care | Attending: Internal Medicine

## 2019-01-26 ENCOUNTER — Encounter (HOSPITAL_COMMUNITY): Payer: Self-pay | Admitting: Registered Nurse

## 2019-01-26 ENCOUNTER — Inpatient Hospital Stay (HOSPITAL_COMMUNITY): Payer: 59

## 2019-01-26 ENCOUNTER — Inpatient Hospital Stay (HOSPITAL_COMMUNITY): Payer: 59 | Admitting: Registered Nurse

## 2019-01-26 DIAGNOSIS — Z9889 Other specified postprocedural states: Secondary | ICD-10-CM

## 2019-01-26 DIAGNOSIS — J869 Pyothorax without fistula: Secondary | ICD-10-CM

## 2019-01-26 HISTORY — PX: VIDEO BRONCHOSCOPY: SHX5072

## 2019-01-26 HISTORY — PX: VIDEO ASSISTED THORACOSCOPY (VATS)/EMPYEMA: SHX6172

## 2019-01-26 HISTORY — PX: TEE WITHOUT CARDIOVERSION: SHX5443

## 2019-01-26 LAB — GLUCOSE, CAPILLARY
Glucose-Capillary: 210 mg/dL — ABNORMAL HIGH (ref 70–99)
Glucose-Capillary: 149 mg/dL — ABNORMAL HIGH (ref 70–99)
Glucose-Capillary: 233 mg/dL — ABNORMAL HIGH (ref 70–99)
Glucose-Capillary: 236 mg/dL — ABNORMAL HIGH (ref 70–99)
Glucose-Capillary: 218 mg/dL — ABNORMAL HIGH (ref 70–99)
Glucose-Capillary: 245 mg/dL — ABNORMAL HIGH (ref 70–99)
Glucose-Capillary: 207 mg/dL — ABNORMAL HIGH (ref 70–99)
Glucose-Capillary: 293 mg/dL — ABNORMAL HIGH (ref 70–99)
Glucose-Capillary: 267 mg/dL — ABNORMAL HIGH (ref 70–99)

## 2019-01-26 LAB — BASIC METABOLIC PANEL
Anion gap: 13 (ref 5–15)
BUN: 5 mg/dL — ABNORMAL LOW (ref 6–20)
CO2: 24 mmol/L (ref 22–32)
Calcium: 8.1 mg/dL — ABNORMAL LOW (ref 8.9–10.3)
Chloride: 92 mmol/L — ABNORMAL LOW (ref 98–111)
Creatinine, Ser: 0.73 mg/dL (ref 0.61–1.24)
GFR calc Af Amer: >60 mL/min (ref >60)
GFR calc non Af Amer: >60 mL/min (ref >60)
Glucose, Bld: 210 mg/dL — ABNORMAL HIGH (ref 70–99)
Potassium: 3.2 mmol/L — ABNORMAL LOW (ref 3.5–5.1)
Sodium: 129 mmol/L — ABNORMAL LOW (ref 135–145)

## 2019-01-26 LAB — LEGIONELLA PNEUMOPHILA SEROGP 1 UR AG: L. pneumophila Serogp 1 Ur Ag: NEGATIVE

## 2019-01-26 LAB — BRAIN NATRIURETIC PEPTIDE: B Natriuretic Peptide: 139.3 pg/mL — ABNORMAL HIGH (ref 0.0–100.0)

## 2019-01-26 LAB — CBC
HCT: 29.1 % — ABNORMAL LOW (ref 39.0–52.0)
Hemoglobin: 9.4 g/dL — ABNORMAL LOW (ref 13.0–17.0)
MCH: 27.4 pg (ref 26.0–34.0)
MCHC: 32.3 g/dL (ref 30.0–36.0)
MCV: 84.8 fL (ref 80.0–100.0)
Platelets: 639 10*3/uL — ABNORMAL HIGH (ref 150–400)
RBC: 3.43 MIL/uL — ABNORMAL LOW (ref 4.22–5.81)
RDW: 13.7 % (ref 11.5–15.5)
WBC: 12.9 10*3/uL — ABNORMAL HIGH (ref 4.0–10.5)
nRBC: 0 % (ref 0.0–0.2)

## 2019-01-26 LAB — ECHO INTRAOPERATIVE TEE
Height: 71 in
Weight: 2864.22 oz

## 2019-01-26 LAB — MAGNESIUM: Magnesium: 1.8 mg/dL (ref 1.7–2.4)

## 2019-01-26 SURGERY — BRONCHOSCOPY, VIDEO-ASSISTED
Anesthesia: General | Site: Chest

## 2019-01-26 MED ORDER — DIPHENHYDRAMINE HCL 12.5 MG/5ML PO ELIX: 12.5000 mg | ORAL_SOLUTION | Freq: Four times a day (QID) | ORAL | Status: DC | PRN

## 2019-01-26 MED ORDER — PHENYLEPHRINE HCL-NACL 10-0.9 MG/250ML-% IV SOLN
INTRAVENOUS | Status: AC
  Filled 2019-01-26: qty 250

## 2019-01-26 MED ORDER — FENTANYL CITRATE (PF) 100 MCG/2ML IJ SOLN
25.0000 ug | INTRAMUSCULAR | Status: DC | PRN
  Administered 2019-01-26 (×3): 50 ug via INTRAVENOUS

## 2019-01-26 MED ORDER — LIDOCAINE 2% (20 MG/ML) 5 ML SYRINGE
INTRAMUSCULAR | Status: AC
  Filled 2019-01-26: qty 5

## 2019-01-26 MED ORDER — LIDOCAINE 2% (20 MG/ML) 5 ML SYRINGE
INTRAMUSCULAR | Status: DC | PRN
  Administered 2019-01-26: 80 mg via INTRAVENOUS

## 2019-01-26 MED ORDER — 0.9 % SODIUM CHLORIDE (POUR BTL) OPTIME
TOPICAL | Status: DC | PRN
  Administered 2019-01-26: 08:00:00 2000 mL

## 2019-01-26 MED ORDER — FENTANYL CITRATE (PF) 250 MCG/5ML IJ SOLN
INTRAMUSCULAR | Status: AC
  Filled 2019-01-26: qty 5

## 2019-01-26 MED ORDER — PHENYLEPHRINE 40 MCG/ML (10ML) SYRINGE FOR IV PUSH (FOR BLOOD PRESSURE SUPPORT)
PREFILLED_SYRINGE | INTRAVENOUS | Status: DC | PRN
  Administered 2019-01-26: 80 ug via INTRAVENOUS

## 2019-01-26 MED ORDER — PROPOFOL 10 MG/ML IV BOLUS
INTRAVENOUS | Status: DC | PRN
  Administered 2019-01-26: 50 mg via INTRAVENOUS
  Administered 2019-01-26: 150 mg via INTRAVENOUS

## 2019-01-26 MED ORDER — ONDANSETRON HCL 4 MG/2ML IJ SOLN: 4.0000 mg | Freq: Four times a day (QID) | INTRAMUSCULAR | Status: DC | PRN

## 2019-01-26 MED ORDER — CEFAZOLIN SODIUM-DEXTROSE 2-3 GM-%(50ML) IV SOLR
INTRAVENOUS | Status: DC | PRN
  Administered 2019-01-26: 2 g via INTRAVENOUS

## 2019-01-26 MED ORDER — SUCCINYLCHOLINE CHLORIDE 200 MG/10ML IV SOSY
PREFILLED_SYRINGE | INTRAVENOUS | Status: AC
  Filled 2019-01-26: qty 10

## 2019-01-26 MED ORDER — POTASSIUM CHLORIDE 10 MEQ/50ML IV SOLN: 10.0000 meq | Freq: Every day | INTRAVENOUS | Status: DC | PRN

## 2019-01-26 MED ORDER — SUCCINYLCHOLINE CHLORIDE 200 MG/10ML IV SOSY
PREFILLED_SYRINGE | INTRAVENOUS | Status: DC | PRN
  Administered 2019-01-26: 140 mg via INTRAVENOUS

## 2019-01-26 MED ORDER — KETAMINE HCL 10 MG/ML IJ SOLN
INTRAMUSCULAR | Status: DC | PRN
  Administered 2019-01-26: 50 mg via INTRAVENOUS

## 2019-01-26 MED ORDER — SODIUM CHLORIDE 0.9 % IV SOLN
INTRAVENOUS | Status: DC | PRN
  Administered 2019-01-26: 10:00:00 500 mL

## 2019-01-26 MED ORDER — CEFAZOLIN SODIUM 1 G IJ SOLR
INTRAMUSCULAR | Status: AC
  Filled 2019-01-26: qty 20

## 2019-01-26 MED ORDER — MIDAZOLAM HCL 5 MG/5ML IJ SOLN
INTRAMUSCULAR | Status: DC | PRN
  Administered 2019-01-26: 2 mg via INTRAVENOUS

## 2019-01-26 MED ORDER — VANCOMYCIN HCL 10 G IV SOLR
1500.0000 mg | Freq: Two times a day (BID) | INTRAVENOUS | Status: DC
  Administered 2019-01-26 – 2019-01-29 (×7): 1500 mg via INTRAVENOUS
  Filled 2019-01-26 (×8): qty 1500

## 2019-01-26 MED ORDER — SODIUM CHLORIDE 0.9% FLUSH: 9.0000 mL | INTRAVENOUS | Status: DC | PRN

## 2019-01-26 MED ORDER — ACETAMINOPHEN 160 MG/5ML PO SOLN: 1000.0000 mg | Freq: Four times a day (QID) | ORAL | Status: DC

## 2019-01-26 MED ORDER — EPHEDRINE 5 MG/ML INJ
INTRAVENOUS | Status: AC
  Filled 2019-01-26: qty 10

## 2019-01-26 MED ORDER — LACTATED RINGERS IV SOLN
INTRAVENOUS | Status: DC | PRN
  Administered 2019-01-26: 08:00:00 via INTRAVENOUS

## 2019-01-26 MED ORDER — ONDANSETRON HCL 4 MG/2ML IJ SOLN
INTRAMUSCULAR | Status: AC
  Filled 2019-01-26: qty 2

## 2019-01-26 MED ORDER — PROPOFOL 10 MG/ML IV BOLUS
INTRAVENOUS | Status: AC
  Filled 2019-01-26: qty 20

## 2019-01-26 MED ORDER — ACETAMINOPHEN 500 MG PO TABS
1000.0000 mg | ORAL_TABLET | Freq: Four times a day (QID) | ORAL | Status: DC
  Administered 2019-01-26 – 2019-01-29 (×11): 1000 mg via ORAL
  Administered 2019-01-29: 23:00:00 650 mg via ORAL
  Administered 2019-01-29 – 2019-01-30 (×4): 1000 mg via ORAL
  Filled 2019-01-26 (×17): qty 2

## 2019-01-26 MED ORDER — BUPIVACAINE HCL (PF) 0.5 % IJ SOLN
INTRAMUSCULAR | Status: AC
  Filled 2019-01-26: qty 10

## 2019-01-26 MED ORDER — INSULIN REGULAR(HUMAN) IN NACL 100-0.9 UT/100ML-% IV SOLN
INTRAVENOUS | Status: DC
  Administered 2019-01-26: 1.8 [IU]/h via INTRAVENOUS
  Filled 2019-01-26: qty 100

## 2019-01-26 MED ORDER — PHENYLEPHRINE 40 MCG/ML (10ML) SYRINGE FOR IV PUSH (FOR BLOOD PRESSURE SUPPORT)
PREFILLED_SYRINGE | INTRAVENOUS | Status: AC
  Filled 2019-01-26: qty 10

## 2019-01-26 MED ORDER — OXYCODONE HCL 5 MG PO TABS
5.0000 mg | ORAL_TABLET | ORAL | Status: DC | PRN
  Administered 2019-01-28 (×2): 10 mg via ORAL
  Administered 2019-01-28: 5 mg via ORAL
  Administered 2019-01-29 (×2): 10 mg via ORAL
  Administered 2019-01-30: 10:00:00 5 mg via ORAL
  Administered 2019-01-30 – 2019-01-31 (×3): 10 mg via ORAL
  Filled 2019-01-26 (×3): qty 2
  Filled 2019-01-26: qty 1
  Filled 2019-01-26 (×5): qty 2

## 2019-01-26 MED ORDER — LEVALBUTEROL HCL 0.63 MG/3ML IN NEBU: 0.6300 mg | INHALATION_SOLUTION | Freq: Four times a day (QID) | RESPIRATORY_TRACT | Status: DC

## 2019-01-26 MED ORDER — ESMOLOL HCL 100 MG/10ML IV SOLN
INTRAVENOUS | Status: DC | PRN
  Administered 2019-01-26: 20 mg via INTRAVENOUS

## 2019-01-26 MED ORDER — DEXAMETHASONE SODIUM PHOSPHATE 10 MG/ML IJ SOLN
INTRAMUSCULAR | Status: AC
  Filled 2019-01-26: qty 1

## 2019-01-26 MED ORDER — ORAL CARE MOUTH RINSE
15.0000 mL | Freq: Two times a day (BID) | OROMUCOSAL | Status: DC
  Administered 2019-01-26 – 2019-01-29 (×5): 15 mL via OROMUCOSAL

## 2019-01-26 MED ORDER — BUPIVACAINE 0.5 % ON-Q PUMP SINGLE CATH 400 ML
400.0000 mL | INJECTION | Status: AC
  Administered 2019-01-26: 400 mL
  Filled 2019-01-26: qty 400

## 2019-01-26 MED ORDER — DEXAMETHASONE SODIUM PHOSPHATE 10 MG/ML IJ SOLN
INTRAMUSCULAR | Status: DC | PRN
  Administered 2019-01-26: 10 mg via INTRAVENOUS

## 2019-01-26 MED ORDER — BISACODYL 5 MG PO TBEC
10.0000 mg | DELAYED_RELEASE_TABLET | Freq: Every day | ORAL | Status: DC
  Administered 2019-01-26 – 2019-01-29 (×4): 10 mg via ORAL
  Filled 2019-01-26 (×6): qty 2

## 2019-01-26 MED ORDER — OXYCODONE HCL 5 MG PO TABS: 5.0000 mg | ORAL_TABLET | Freq: Once | ORAL | Status: DC | PRN

## 2019-01-26 MED ORDER — LACTATED RINGERS IV SOLN
INTRAVENOUS | Status: DC | PRN
  Administered 2019-01-26 (×2): via INTRAVENOUS

## 2019-01-26 MED ORDER — FENTANYL CITRATE (PF) 250 MCG/5ML IJ SOLN
INTRAMUSCULAR | Status: DC | PRN
  Administered 2019-01-26 (×2): 50 ug via INTRAVENOUS
  Administered 2019-01-26: 25 ug via INTRAVENOUS
  Administered 2019-01-26 (×2): 50 ug via INTRAVENOUS
  Administered 2019-01-26: 25 ug via INTRAVENOUS
  Administered 2019-01-26 (×4): 50 ug via INTRAVENOUS

## 2019-01-26 MED ORDER — MIDAZOLAM HCL 2 MG/2ML IJ SOLN
INTRAMUSCULAR | Status: AC
  Filled 2019-01-26: qty 2

## 2019-01-26 MED ORDER — ALBUTEROL SULFATE HFA 108 (90 BASE) MCG/ACT IN AERS
INHALATION_SPRAY | RESPIRATORY_TRACT | Status: DC | PRN
  Administered 2019-01-26 (×2): 6 via RESPIRATORY_TRACT

## 2019-01-26 MED ORDER — BUPIVACAINE HCL (PF) 0.5 % IJ SOLN
INTRAMUSCULAR | Status: DC | PRN
  Administered 2019-01-26: 10 mL

## 2019-01-26 MED ORDER — INSULIN ASPART 100 UNIT/ML ~~LOC~~ SOLN
0.0000 [IU] | SUBCUTANEOUS | Status: DC
  Administered 2019-01-26: 8 [IU] via SUBCUTANEOUS
  Administered 2019-01-26 (×2): 12 [IU] via SUBCUTANEOUS
  Administered 2019-01-27: 4 [IU] via SUBCUTANEOUS
  Administered 2019-01-27 – 2019-01-28 (×6): 8 [IU] via SUBCUTANEOUS
  Administered 2019-01-28: 12 [IU] via SUBCUTANEOUS
  Administered 2019-01-28: 04:00:00 4 [IU] via SUBCUTANEOUS
  Administered 2019-01-28: 17:00:00 8 [IU] via SUBCUTANEOUS
  Administered 2019-01-28 – 2019-01-29 (×3): 4 [IU] via SUBCUTANEOUS
  Administered 2019-01-29: 2 [IU] via SUBCUTANEOUS
  Administered 2019-01-30: 4 [IU] via SUBCUTANEOUS
  Administered 2019-01-30: 2 [IU] via SUBCUTANEOUS

## 2019-01-26 MED ORDER — ROCURONIUM BROMIDE 10 MG/ML (PF) SYRINGE
PREFILLED_SYRINGE | INTRAVENOUS | Status: AC
  Filled 2019-01-26: qty 10

## 2019-01-26 MED ORDER — PROMETHAZINE HCL 25 MG/ML IJ SOLN: 6.2500 mg | INTRAMUSCULAR | Status: DC | PRN

## 2019-01-26 MED ORDER — ONDANSETRON HCL 4 MG/2ML IJ SOLN
INTRAMUSCULAR | Status: DC | PRN
  Administered 2019-01-26: 4 mg via INTRAVENOUS

## 2019-01-26 MED ORDER — TRAMADOL HCL 50 MG PO TABS
50.0000 mg | ORAL_TABLET | Freq: Four times a day (QID) | ORAL | Status: DC | PRN
  Administered 2019-01-28 – 2019-01-29 (×3): 100 mg via ORAL
  Filled 2019-01-26 (×3): qty 2

## 2019-01-26 MED ORDER — SENNOSIDES-DOCUSATE SODIUM 8.6-50 MG PO TABS
1.0000 | ORAL_TABLET | Freq: Every day | ORAL | Status: DC
  Administered 2019-01-26 – 2019-01-30 (×4): 1 via ORAL
  Filled 2019-01-26 (×4): qty 1

## 2019-01-26 MED ORDER — MORPHINE SULFATE 2 MG/ML IV SOLN
INTRAVENOUS | Status: DC
  Administered 2019-01-26: 10 mg via INTRAVENOUS
  Administered 2019-01-26 (×2): via INTRAVENOUS
  Administered 2019-01-27: 28.5 mg via INTRAVENOUS
  Filled 2019-01-26 (×2): qty 50

## 2019-01-26 MED ORDER — KETOROLAC TROMETHAMINE 30 MG/ML IJ SOLN
INTRAMUSCULAR | Status: DC | PRN
  Administered 2019-01-26: 30 mg via INTRAVENOUS

## 2019-01-26 MED ORDER — KETAMINE HCL 50 MG/5ML IJ SOSY
PREFILLED_SYRINGE | INTRAMUSCULAR | Status: AC
  Filled 2019-01-26: qty 5

## 2019-01-26 MED ORDER — EPINEPHRINE PF 1 MG/ML IJ SOLN
INTRAMUSCULAR | Status: AC
  Filled 2019-01-26: qty 1

## 2019-01-26 MED ORDER — FENTANYL CITRATE (PF) 100 MCG/2ML IJ SOLN
INTRAMUSCULAR | Status: AC
  Filled 2019-01-26: qty 2

## 2019-01-26 MED ORDER — OXYCODONE HCL 5 MG/5ML PO SOLN: 5.0000 mg | Freq: Once | ORAL | Status: DC | PRN

## 2019-01-26 MED ORDER — NALOXONE HCL 0.4 MG/ML IJ SOLN: 0.4000 mg | INTRAMUSCULAR | Status: DC | PRN

## 2019-01-26 MED ORDER — ROCURONIUM BROMIDE 10 MG/ML (PF) SYRINGE
PREFILLED_SYRINGE | INTRAVENOUS | Status: DC | PRN
  Administered 2019-01-26: 70 mg via INTRAVENOUS
  Administered 2019-01-26: 30 mg via INTRAVENOUS

## 2019-01-26 MED ORDER — SODIUM CHLORIDE 0.45 % IV SOLN
INTRAVENOUS | Status: DC
  Administered 2019-01-26: 14:00:00 via INTRAVENOUS

## 2019-01-26 MED ORDER — HEMOSTATIC AGENTS (NO CHARGE) OPTIME
TOPICAL | Status: DC | PRN
  Administered 2019-01-26: 1 via TOPICAL

## 2019-01-26 MED ORDER — SUGAMMADEX SODIUM 200 MG/2ML IV SOLN
INTRAVENOUS | Status: DC | PRN
  Administered 2019-01-26 (×2): 100 mg via INTRAVENOUS

## 2019-01-26 MED ORDER — DIPHENHYDRAMINE HCL 50 MG/ML IJ SOLN: 12.5000 mg | Freq: Four times a day (QID) | INTRAMUSCULAR | Status: DC | PRN

## 2019-01-26 SURGICAL SUPPLY — 59 items
BAG DECANTER FOR FLEXI CONT (MISCELLANEOUS) ×8 IMPLANT
BLADE SURG 11 STRL SS (BLADE) ×8 IMPLANT
CANISTER SUCT 3000ML PPV (MISCELLANEOUS) ×4 IMPLANT
CATH KIT ON-Q SILVERSOAK 5IN (CATHETERS) ×4 IMPLANT
CATH THORACIC 36FR RT ANG (CATHETERS) ×4 IMPLANT
CONN ST 1/4X3/8  BEN (MISCELLANEOUS) ×2
CONN ST 1/4X3/8 BEN (MISCELLANEOUS) ×2 IMPLANT
CONT SPEC 4OZ CLIKSEAL STRL BL (MISCELLANEOUS) ×12 IMPLANT
DRAIN CHANNEL 28F RND 3/8 FF (WOUND CARE) ×4 IMPLANT
DRAPE CHEST BREAST 15X10 FENES (DRAPES) ×4 IMPLANT
DRAPE LAPAROSCOPIC ABDOMINAL (DRAPES) IMPLANT
DRAPE SLUSH/WARMER DISC (DRAPES) ×4 IMPLANT
ELECT REM PT RETURN 9FT ADLT (ELECTROSURGICAL) ×4
ELECTRODE REM PT RTRN 9FT ADLT (ELECTROSURGICAL) ×2 IMPLANT
GAUZE SPONGE 4X4 12PLY STRL (GAUZE/BANDAGES/DRESSINGS) ×4 IMPLANT
GAUZE SPONGE 4X4 12PLY STRL LF (GAUZE/BANDAGES/DRESSINGS) ×4 IMPLANT
GLOVE BIO SURGEON STRL SZ7.5 (GLOVE) ×8 IMPLANT
GOWN STRL REUS W/ TWL LRG LVL3 (GOWN DISPOSABLE) ×8 IMPLANT
GOWN STRL REUS W/TWL LRG LVL3 (GOWN DISPOSABLE) ×16
KIT BASIN OR (CUSTOM PROCEDURE TRAY) ×4 IMPLANT
KIT SUCTION CATH 14FR (SUCTIONS) ×4 IMPLANT
KIT TURNOVER KIT B (KITS) ×4 IMPLANT
NS IRRIG 1000ML POUR BTL (IV SOLUTION) ×12 IMPLANT
PACK CHEST (CUSTOM PROCEDURE TRAY) ×4 IMPLANT
PAD ARMBOARD 7.5X6 YLW CONV (MISCELLANEOUS) ×8 IMPLANT
SEALANT SURG COSEAL 4ML (VASCULAR PRODUCTS) IMPLANT
SEALANT SURG COSEAL 8ML (VASCULAR PRODUCTS) ×4 IMPLANT
SOLUTION ANTI FOG 6CC (MISCELLANEOUS) ×4 IMPLANT
SPONGE INTESTINAL PEANUT (DISPOSABLE) ×4 IMPLANT
SPONGE TONSIL TAPE 1 RFD (DISPOSABLE) ×4 IMPLANT
STAPLER VISISTAT 35W (STAPLE) ×4 IMPLANT
SUT CHROMIC 3 0 SH 27 (SUTURE) IMPLANT
SUT ETHILON 3 0 PS 1 (SUTURE) IMPLANT
SUT PROLENE 3 0 SH DA (SUTURE) IMPLANT
SUT PROLENE 4 0 RB 1 (SUTURE)
SUT PROLENE 4-0 RB1 .5 CRCL 36 (SUTURE) IMPLANT
SUT SILK  1 MH (SUTURE) ×4
SUT SILK 1 MH (SUTURE) ×4 IMPLANT
SUT SILK 2 0 SH (SUTURE) ×4 IMPLANT
SUT SILK 2 0SH CR/8 30 (SUTURE) IMPLANT
SUT SILK 3 0SH CR/8 30 (SUTURE) IMPLANT
SUT VIC AB 1 CTX 18 (SUTURE) ×8 IMPLANT
SUT VIC AB 2 TP1 27 (SUTURE) ×4 IMPLANT
SUT VIC AB 2-0 CTX 36 (SUTURE) ×4 IMPLANT
SUT VIC AB 3-0 X1 27 (SUTURE) ×4 IMPLANT
SUT VICRYL 0 UR6 27IN ABS (SUTURE) IMPLANT
SWAB COLLECTION DEVICE MRSA (MISCELLANEOUS) ×4 IMPLANT
SWAB CULTURE ESWAB REG 1ML (MISCELLANEOUS) ×4 IMPLANT
SYR BULB IRRIGATION 50ML (SYRINGE) ×4 IMPLANT
SYSTEM SAHARA CHEST DRAIN ATS (WOUND CARE) ×4 IMPLANT
TAPE CLOTH SURG 4X10 WHT LF (GAUZE/BANDAGES/DRESSINGS) ×4 IMPLANT
TOWEL GREEN STERILE (TOWEL DISPOSABLE) ×4 IMPLANT
TOWEL GREEN STERILE FF (TOWEL DISPOSABLE) ×4 IMPLANT
TRAP SPECIMEN MUCOUS 40CC (MISCELLANEOUS) ×4 IMPLANT
TRAY FOLEY MTR SLVR 16FR STAT (SET/KITS/TRAYS/PACK) ×4 IMPLANT
TUBE CONNECTING 20'X1/4 (TUBING) ×2
TUBE CONNECTING 20X1/4 (TUBING) ×6 IMPLANT
TUNNELER SHEATH ON-Q 11GX8 DSP (PAIN MANAGEMENT) ×4 IMPLANT
WATER STERILE IRR 1000ML POUR (IV SOLUTION) ×8 IMPLANT

## 2019-01-26 NOTE — Progress Notes (Signed)
Pre Procedure note for inpatients:   Gary Hudson has been scheduled for Procedure(s): VIDEO BRONCHOSCOPY (N/A) VIDEO ASSISTED THORACOSCOPY (VATS)/EMPYEMA (Left) TRANSESOPHAGEAL ECHOCARDIOGRAM (TEE) (N/A) today. The various methods of treatment have been discussed with the patient. After consideration of the risks, benefits and treatment options the patient has consented to the planned procedure.   The patient has been seen and labs reviewed. There are no changes in the patient's condition to prevent proceeding with the planned procedure today.  Recent labs:  Lab Results  Component Value Date   WBC 12.9 (H) 01/26/2019   HGB 9.4 (L) 01/26/2019   HCT 29.1 (L) 01/26/2019   PLT 639 (H) 01/26/2019   GLUCOSE 210 (H) 01/26/2019   ALT 18 01/24/2019   AST 19 01/24/2019   NA 129 (L) 01/26/2019   K 3.2 (L) 01/26/2019   CL 92 (L) 01/26/2019   CREATININE 0.73 01/26/2019   BUN 5 (L) 01/26/2019   CO2 24 01/26/2019   TSH 2.880 01/25/2019   INR 1.3 (H) 01/24/2019   HGBA1C 11.4 (H) 01/25/2019    Len Childs, MD 01/26/2019 8:13 AM

## 2019-01-26 NOTE — Anesthesia Postprocedure Evaluation (Addendum)
Anesthesia Post Note  Patient: Gary Hudson  Procedure(s) Performed: VIDEO BRONCHOSCOPY (N/A ) VIDEO ASSISTED THORACOSCOPY (VATS)/DRAINAGE OF EMPYEMA  AND DECORTICATION (Left Chest) TRANSESOPHAGEAL ECHOCARDIOGRAM (TEE) (N/A )     Patient location during evaluation: PACU Anesthesia Type: General Level of consciousness: awake and alert Pain management: pain level controlled Vital Signs Assessment: post-procedure vital signs reviewed and stable Respiratory status: spontaneous breathing, nonlabored ventilation, respiratory function stable and patient connected to nasal cannula oxygen Cardiovascular status: blood pressure returned to baseline and stable Postop Assessment: no apparent nausea or vomiting Anesthetic complications: no    Last Vitals:  Vitals:   01/26/19 1300 01/26/19 1315  BP: 137/74 138/85  Pulse: 78 80  Resp: 14 (!) 21  Temp:    SpO2: 100% 100%    Last Pain:  Vitals:   01/26/19 1300  TempSrc:   PainSc: [place]

## 2019-01-26 NOTE — Progress Notes (Signed)
Patient ID: Gary Hudson, male   DOB: 1965/11/21, 53 y.o.   MRN: [id number] EVENING ROUNDS NOTE :     [address]       [place] [address]             [phone]                 Day of Surgery Procedure(s) (LRB): VIDEO BRONCHOSCOPY (N/A) VIDEO ASSISTED THORACOSCOPY (VATS)/DRAINAGE OF EMPYEMA  AND DECORTICATION (Left) TRANSESOPHAGEAL ECHOCARDIOGRAM (TEE) (N/A)  Total Length of Stay:  LOS: 2 days  BP 107/74   Pulse 87   Temp 97.9 F (36.6 C)   Resp 20   Ht 5\' 11"  (1.803 m)   Wt 81.2 kg   SpO2 93%   BMI 24.97 kg/m   .Intake/Output      06/03 0701 - 06/04 0700 06/04 0701 - 06/05 0700   P.O. 462 240   I.V. (mL/kg) 47.4 (0.6) 1200 (14.8)   IV Piggyback 117    Total Intake(mL/kg) 626.4 (7.7) 1440 (17.7)   Urine (mL/kg/hr)  1045 (1.2)   Other  200   Blood  150   Chest Tube  69   Total Output  1464   Net +626.4 -24        Urine Occurrence 4 x      . sodium chloride 100 mL/hr at 01/26/19 1400  . piperacillin-tazobactam (ZOSYN)  IV 3.375 g (01/26/19 0245)  . potassium chloride    . vancomycin 1,500 mg (01/26/19 1621)     Lab Results  Component Value Date   WBC 12.9 (H) 01/26/2019   HGB 9.4 (L) 01/26/2019   HCT 29.1 (L) 01/26/2019   PLT 639 (H) 01/26/2019   GLUCOSE 210 (H) 01/26/2019   ALT 18 01/24/2019   AST 19 01/24/2019   NA 129 (L) 01/26/2019   K 3.2 (L) 01/26/2019   CL 92 (L) 01/26/2019   CREATININE 0.73 01/26/2019   BUN 5 (L) 01/26/2019   CO2 24 01/26/2019   TSH 2.880 01/25/2019   INR 1.3 (H) 01/24/2019   HGBA1C 11.4 (H) 01/25/2019   Vats drainage today Stable vs minimal drainage   Grace Isaac MD  Beeper [phone] Office [phone] 01/26/2019 5:59 PM

## 2019-01-26 NOTE — Transfer of Care (Signed)
Immediate Anesthesia Transfer of Care Note  Patient: Gary Hudson  Procedure(s) Performed: VIDEO BRONCHOSCOPY (N/A ) VIDEO ASSISTED THORACOSCOPY (VATS)/DRAINAGE OF EMPYEMA  AND DECORTICATION (Left Chest) TRANSESOPHAGEAL ECHOCARDIOGRAM (TEE) (N/A )  Patient Location: PACU  Anesthesia Type:General  Level of Consciousness: awake, alert , oriented and patient cooperative  Airway & Oxygen Therapy: Patient Spontanous Breathing and Patient connected to face mask oxygen  Post-op Assessment: Report given to RN and Post -op Vital signs reviewed and stable  Post vital signs: Reviewed and stable  Last Vitals:  Vitals Value Taken Time  BP 125/81 01/26/2019 11:12 AM  Temp    Pulse 85 01/26/2019 11:24 AM  Resp 23 01/26/2019 11:24 AM  SpO2 100 % 01/26/2019 11:24 AM  Vitals shown include unvalidated device data.  Last Pain:  Vitals:   01/26/19 1115  TempSrc:   PainSc: (P) Asleep     Patient's dentition remains in pre-operative state. Full report given to [employer]. Patient currently on 1.8 u/hr of insulin.  Patients Stated Pain Goal: 0 (16/10/96 0454)  Complications: No apparent anesthesia complications

## 2019-01-26 NOTE — Progress Notes (Signed)
Pharmacy Antibiotic Note  Gary Hudson is a 53 y.o. male admitted on 01/23/2019 with empyema.  Pharmacy has been consulted for vancomycin dosing. Pt now s/p VATs today, has been on Zosyn.  Plan: Vancomycin 1500mg  IV q12h - est AUC   Height: 5\' 11"  (180.3 cm) Weight: 179 lb 0.2 oz (81.2 kg) IBW/kg (Calculated) : 75.3  Temp (24hrs), Avg:97.9 F (36.6 C), Min:97.4 F (36.3 C), Max:98.2 F (36.8 C)  Recent Labs  Lab 01/23/19 2351 01/23/19 2352 01/24/19 0146 01/24/19 0504 01/24/19 0857 01/24/19 1328 01/24/19 1629 01/24/19 2107 01/25/19 0403 01/26/19 0533  WBC 23.8*  --   --  19.0*  --   --   --   --  14.3* 12.9*  CREATININE 0.97  --   --  0.85 0.71 0.62 0.58* 0.67 0.80 0.73  LATICACIDVEN  --  2.5* 1.4 1.2 1.5  --   --   --   --   --     Estimated Creatinine Clearance: 112.4 mL/min (by C-G formula based on SCr of 0.73 mg/dL).    No Known Allergies    Arrie Senate, PharmD, BCPS Clinical Pharmacist [phone] Please check AMION for all [place] numbers 01/26/2019

## 2019-01-26 NOTE — Brief Op Note (Signed)
01/23/2019 - 01/26/2019  10:45 AM  PATIENT:  Francee Piccolo A Sosinski  53 y.o. male  PRE-OPERATIVE DIAGNOSIS:  LEFT EMPYEMA  POST-OPERATIVE DIAGNOSIS:  LEFT EMPYEMA  PROCEDURE:  Procedure(s):  VIDEO BRONCHOSCOPY (N/A) VIDEO ASSISTED THORACOSCOPY/ THORACOTOMY  DECORTICATION (Left) DRAINAGE OF EMPYEMA TRANSESOPHAGEAL ECHOCARDIOGRAM (TEE) (N/A)  SURGEON:  Surgeon(s) and Role:    Ivin Poot, MD - Primary  PHYSICIAN ASSISTANT: Ellwood Handler PA-C  ANESTHESIA:   general  EBL:  150 mL   BLOOD ADMINISTERED:none  DRAINS: 28 angled, 28 blake drain x 2   LOCAL MEDICATIONS USED:  BUPIVICAINE   SPECIMEN:  Source of Specimen:  pleural peel, pleural fluid  DISPOSITION OF SPECIMEN:  Microbiology, pathology  COUNTS:  YES  TOURNIQUET:  * No tourniquets in log *  DICTATION: .Dragon Dictation  PLAN OF CARE: Admit to inpatient   PATIENT DISPOSITION:  PACU - hemodynamically stable.   Delay start of Pharmacological VTE agent (>24hrs) due to surgical blood loss or risk of bleeding: yes

## 2019-01-26 NOTE — Anesthesia Procedure Notes (Signed)
Arterial Line Insertion Start/End6/11/2018 7:40 AM, 01/26/2019 7:45 AM Performed by: Jearld Pies, CRNA, CRNA  Patient location: Pre-op. Preanesthetic checklist: patient identified, IV checked, site marked, risks and benefits discussed, surgical consent, monitors and equipment checked, pre-op evaluation, timeout performed and anesthesia consent Lidocaine 1% used for infiltration Left, radial was placed Catheter size: 20 G Hand hygiene performed , maximum sterile barriers used  and Seldinger technique used Allen's test indicative of satisfactory collateral circulation Attempts: 1 Procedure performed without using ultrasound guided technique. Following insertion, dressing applied and Biopatch. Post procedure assessment: normal  Patient tolerated the procedure well with no immediate complications.

## 2019-01-26 NOTE — Anesthesia Procedure Notes (Signed)
Procedure Name: Intubation Date/Time: 01/26/2019 8:36 AM Performed by: Jearld Pies, CRNA Pre-anesthesia Checklist: Patient identified, Emergency Drugs available, Suction available and Patient being monitored Patient Re-evaluated:Patient Re-evaluated prior to induction Oxygen Delivery Method: Circle System Utilized Preoxygenation: Pre-oxygenation with 100% oxygen Induction Type: IV induction Ventilation: Oral airway inserted - appropriate to patient size and Two handed mask ventilation required Laryngoscope Size: Glidescope and 3 Grade View: Grade I Tube type: Oral Endobronchial tube: Left, Double lumen EBT, EBT position confirmed by auscultation and EBT position confirmed by fiberoptic bronchoscope and 39 Fr Tube size: 7.0 mm Number of attempts: 4 Airway Equipment and Method: Stylet and Oral airway Placement Confirmation: ETT inserted through vocal cords under direct vision,  positive ETCO2 and breath sounds checked- equal and bilateral Secured at: 31 cm Tube secured with: Tape Dental Injury: Teeth and Oropharynx as per pre-operative assessment  Difficulty Due To: Difficulty was anticipated, Difficult Airway- due to dentition, Difficult Airway- due to limited oral opening and Difficult Airway- due to anterior larynx Comments: DL x 1 by CRNA - Grade 3 view with MAC 3, Glidescope utilized unable to pass DLT, MD attempt at Glidescope, unable to pass DLT, patient intubated with 7.5 ETT with Glidescope, inserted bougie catheter, exchanged single lumen tube with DLT, verified by fiberoptic bronchoscope and auscultation. Teeth remained in same pre-operative state.

## 2019-01-26 NOTE — Progress Notes (Signed)
Patient taken to the OR this morning for VATS/decortication, drainage of empyema.  He left for the procedure prior to my evaluation and postoperatively was taken to the ICU.  Cardiothoracic his primary care attending at this point.  Spoke with cardiothoracic APP, who stated they will be primary while patient in the ICU.  They will notify medical team if it becomes necessary once patient is out of the ICU.  Please call with further questions as necessary. Will be available as needed.  Gerlean Ren MD [facility]

## 2019-01-27 ENCOUNTER — Encounter (HOSPITAL_COMMUNITY): Payer: Self-pay | Admitting: Cardiothoracic Surgery

## 2019-01-27 ENCOUNTER — Inpatient Hospital Stay (HOSPITAL_COMMUNITY): Payer: 59

## 2019-01-27 LAB — BLOOD GAS, ARTERIAL
Acid-Base Excess: 4.2 mmol/L — ABNORMAL HIGH (ref 0.0–2.0)
Bicarbonate: 28.3 mmol/L — ABNORMAL HIGH (ref 20.0–28.0)
O2 Content: 2 L/min
O2 Saturation: 95.7 %
Patient temperature: 98.6
pCO2 arterial: 43 mmHg (ref 32.0–48.0)
pH, Arterial: 7.434 (ref 7.350–7.450)
pO2, Arterial: 78.7 mmHg — ABNORMAL LOW (ref 83.0–108.0)

## 2019-01-27 LAB — BASIC METABOLIC PANEL
Anion gap: 12 (ref 5–15)
BUN: 11 mg/dL (ref 6–20)
CO2: 25 mmol/L (ref 22–32)
Calcium: 8.5 mg/dL — ABNORMAL LOW (ref 8.9–10.3)
Chloride: 93 mmol/L — ABNORMAL LOW (ref 98–111)
Creatinine, Ser: 0.62 mg/dL (ref 0.61–1.24)
GFR calc Af Amer: >60 mL/min (ref >60)
GFR calc non Af Amer: >60 mL/min (ref >60)
Glucose, Bld: 237 mg/dL — ABNORMAL HIGH (ref 70–99)
Potassium: 3.9 mmol/L (ref 3.5–5.1)
Sodium: 130 mmol/L — ABNORMAL LOW (ref 135–145)

## 2019-01-27 LAB — POCT I-STAT 7, (LYTES, BLD GAS, ICA,H+H)
Acid-Base Excess: 2 mmol/L (ref 0.0–2.0)
Acid-Base Excess: 3 mmol/L — ABNORMAL HIGH (ref 0.0–2.0)
Bicarbonate: 29 mmol/L — ABNORMAL HIGH (ref 20.0–28.0)
Bicarbonate: 30 mmol/L — ABNORMAL HIGH (ref 20.0–28.0)
Calcium, Ion: 1.14 mmol/L — ABNORMAL LOW (ref 1.15–1.40)
Calcium, Ion: 1.15 mmol/L (ref 1.15–1.40)
HCT: 27 % — ABNORMAL LOW (ref 39.0–52.0)
HCT: 30 % — ABNORMAL LOW (ref 39.0–52.0)
Hemoglobin: 10.2 g/dL — ABNORMAL LOW (ref 13.0–17.0)
Hemoglobin: 9.2 g/dL — ABNORMAL LOW (ref 13.0–17.0)
O2 Saturation: 100 %
O2 Saturation: 96 %
Patient temperature: 36.2
Patient temperature: 36.2
Potassium: 3.3 mmol/L — ABNORMAL LOW (ref 3.5–5.1)
Potassium: 3.6 mmol/L (ref 3.5–5.1)
Sodium: 131 mmol/L — ABNORMAL LOW (ref 135–145)
Sodium: 133 mmol/L — ABNORMAL LOW (ref 135–145)
TCO2: 31 mmol/L (ref 22–32)
TCO2: 32 mmol/L (ref 22–32)
pCO2 arterial: 52.9 mmHg — ABNORMAL HIGH (ref 32.0–48.0)
pCO2 arterial: 59.4 mmHg — ABNORMAL HIGH (ref 32.0–48.0)
pH, Arterial: 7.307 — ABNORMAL LOW (ref 7.350–7.450)
pH, Arterial: 7.343 — ABNORMAL LOW (ref 7.350–7.450)
pO2, Arterial: 455 mmHg — ABNORMAL HIGH (ref 83.0–108.0)
pO2, Arterial: 89 mmHg (ref 83.0–108.0)

## 2019-01-27 LAB — QUANTIFERON-TB GOLD PLUS: QuantiFERON-TB Gold Plus: UNDETERMINED — AB

## 2019-01-27 LAB — GLUCOSE, CAPILLARY
Glucose-Capillary: 229 mg/dL — ABNORMAL HIGH (ref 70–99)
Glucose-Capillary: 211 mg/dL — ABNORMAL HIGH (ref 70–99)
Glucose-Capillary: 201 mg/dL — ABNORMAL HIGH (ref 70–99)
Glucose-Capillary: 218 mg/dL — ABNORMAL HIGH (ref 70–99)
Glucose-Capillary: 178 mg/dL — ABNORMAL HIGH (ref 70–99)

## 2019-01-27 LAB — CBC
HCT: 31.1 % — ABNORMAL LOW (ref 39.0–52.0)
Hemoglobin: 10.1 g/dL — ABNORMAL LOW (ref 13.0–17.0)
MCH: 27.7 pg (ref 26.0–34.0)
MCHC: 32.5 g/dL (ref 30.0–36.0)
MCV: 85.4 fL (ref 80.0–100.0)
Platelets: 759 10*3/uL — ABNORMAL HIGH (ref 150–400)
RBC: 3.64 MIL/uL — ABNORMAL LOW (ref 4.22–5.81)
RDW: 13.6 % (ref 11.5–15.5)
WBC: 17.3 10*3/uL — ABNORMAL HIGH (ref 4.0–10.5)
nRBC: 0 % (ref 0.0–0.2)

## 2019-01-27 LAB — ACID FAST SMEAR (AFB, MYCOBACTERIA)
Acid Fast Smear: NEGATIVE
Acid Fast Smear: NEGATIVE

## 2019-01-27 LAB — QUANTIFERON-TB GOLD PLUS (RQFGPL)
QuantiFERON Mitogen Value: 0.08 IU/mL
QuantiFERON Nil Value: 0.02 IU/mL
QuantiFERON TB1 Ag Value: 0.02 IU/mL
QuantiFERON TB2 Ag Value: 0.02 IU/mL

## 2019-01-27 LAB — MAGNESIUM: Magnesium: 2.1 mg/dL (ref 1.7–2.4)

## 2019-01-27 MED ORDER — CHLORHEXIDINE GLUCONATE CLOTH 2 % EX PADS
6.0000 | MEDICATED_PAD | Freq: Every day | CUTANEOUS | Status: DC
  Administered 2019-01-27 – 2019-01-29 (×3): 6 via TOPICAL

## 2019-01-27 MED ORDER — IPRATROPIUM-ALBUTEROL 0.5-2.5 (3) MG/3ML IN SOLN: 3.0000 mL | Freq: Four times a day (QID) | RESPIRATORY_TRACT | Status: DC

## 2019-01-27 MED ORDER — KETOROLAC TROMETHAMINE 30 MG/ML IJ SOLN
30.0000 mg | Freq: Four times a day (QID) | INTRAMUSCULAR | Status: AC
  Administered 2019-01-27 – 2019-01-28 (×2): 30 mg via INTRAVENOUS
  Filled 2019-01-27 (×2): qty 1

## 2019-01-27 MED ORDER — ALBUTEROL SULFATE HFA 108 (90 BASE) MCG/ACT IN AERS
2.0000 | INHALATION_SPRAY | Freq: Four times a day (QID) | RESPIRATORY_TRACT | Status: DC
  Administered 2019-01-27 – 2019-01-31 (×17): 2 via RESPIRATORY_TRACT
  Filled 2019-01-27: qty 6.7

## 2019-01-27 MED ORDER — ENOXAPARIN SODIUM 40 MG/0.4ML ~~LOC~~ SOLN
40.0000 mg | SUBCUTANEOUS | Status: DC
  Administered 2019-01-27 – 2019-01-31 (×5): 40 mg via SUBCUTANEOUS
  Filled 2019-01-27 (×5): qty 0.4

## 2019-01-27 MED ORDER — MORPHINE SULFATE (PF) 2 MG/ML IV SOLN
1.0000 mg | INTRAVENOUS | Status: DC | PRN
  Administered 2019-01-27 (×3): 2 mg via INTRAVENOUS
  Filled 2019-01-27 (×3): qty 1

## 2019-01-27 MED ORDER — ONDANSETRON HCL 4 MG/2ML IJ SOLN: 4.0000 mg | Freq: Four times a day (QID) | INTRAMUSCULAR | Status: DC | PRN

## 2019-01-27 NOTE — Progress Notes (Signed)
      [address]       ,[place] [address]             [phone]      POD # 1 drainage of empyema Sleeping after getting pain med BP (!) 144/90 (BP Location: Right Arm)   Pulse 78   Temp 98.2 F (36.8 C) (Oral)   Resp 17   Ht 5\' 11"  (1.803 m)   Wt 81.2 kg   SpO2 96%   BMI 24.97 kg/m   Intake/Output Summary (Last 24 hours) at 01/27/2019 1723 Last data filed at 01/27/2019 1100 Gross per 24 hour  Intake 1867.46 ml  Output 1184 ml  Net 683.46 ml   Continue current Rx  Emiline Mancebo C. Roxan Hockey, MD [facility] [phone]

## 2019-01-27 NOTE — Op Note (Signed)
NAME: Gary Hudson, CORREA MEDICAL RECORD [id number] ACCOUNT [id number] DATE OF BIRTH:21-Jul-1966 FACILITY: [facility] LOCATION: [facility] PHYSICIAN:Laylamarie Meuser VAN TRIGT III, MD  OPERATIVE REPORT  DATE OF PROCEDURE:  01/26/2019  OPERATION: 1.  Video bronchoscopy. 2.  Left VATS (video-assisted thoracoscopic surgery), drainage of empyema cavity, decortication of left lower lobe.  PREOPERATIVE DIAGNOSIS:    POSTOPERATIVE DIAGNOSIS:    SURGEON:  Ivin Poot, MD  ASSISTANT:  Ellwood Handler, PA-C  ANESTHESIA:  By Renold Don, MD  DESCRIPTION OF PROCEDURE:  The patient was brought from preop holding where consent was documented and the proper site marked.  The patient had been previously counseled on the indications and benefits of surgery as well as the associated risks and the  expected recovery.  Final questions were addressed.  The patient was brought to the operating room and placed supine on the operating table.  General anesthesia was induced.  A double-lumen endotracheal tube was placed.  Care was taken to avoid injury to his fragile dental situation.  The patient was  turned left side up, and left chest was prepped and draped as a sterile field.  Through the double-lumen tube, I passed a fiberoptic scope and examined both main stem bronchi and the proximal left upper lobe and left lower lobe segments.  No  endobronchial lesions were noted.  There were fairly heavy secretions on the left side.  A proper time-out was performed.  A small incision was made anterior to the left scapula and extended into the 5th interspace.  An attempt at placing a camera was unsuccessful due to the obliterated pleural space.  The incision was extended anteriorly,  and a space was found to start the dissection of the lung from the chest wall, which then exposed the empyema cavity.  A large amount of purulent material was drained from the pleural space, at least 100 mL.  This was a thick creamy pus.  It was sent for  cultures, as was the empyema peel.  Using tedious extensive dissection, the empyema space was totally unroofed and the loculations opened up.  The empyema cavity was irrigated with copious amounts of antibiotic irrigation.  The empyema peel and left  lower lobe were then incised, and the dissection plane between the visceral pleura and the peel was developed to allow removal of the peel to allow reexpansion of the entrapped lung.  The fibrinous exudate on the chest wall and diaphragm were also  removed.  After hemostasis was achieved, 2 drainage tubes were placed in the empyema cavity and the subpulmonic pleural space and brought out through separate incisions.  A pericostal suture was placed, and before the ribs were reapproximated, the left  lung was reexpanded, which filled the space nicely.  The muscle layers were closed in layers using #1 Vicryl.  The subcutaneous and skin layers were closed.  The skin was closed with staples because of the purulent nature of the empyema cavity.  An On-Q catheter was placed in the chest wall and connected  to a reservoir of 0.5% Marcaine and secured to the skin.  The patient was then turned and the double-lumen tube was removed, and the patient returned to the recovery room in stable condition.  LN/NUANCE  D:01/27/2019 T:01/27/2019 JOB:[id number]

## 2019-01-27 NOTE — Progress Notes (Signed)
Inpatient Diabetes Program   AACE/ADA: New Consensus Statement on Inpatient Glycemic Control (2015)  Target Ranges:  Prepandial:   less than 140 mg/dL      Peak postprandial:   less than 180 mg/dL (1-2 hours)      Critically ill patients:  140 - 180 mg/dL     Spoke with patient over the phone regarding A1c level, 11% this admission. Patient reports taking his medications for DM maybe 3-4 times a week and does not check his glucose at home. Patient reports he was lazy and kept putting off taking his medication.  Patient reports being followed by his PCP and he sees her every 3 months. Patient reports his PCP always tells him everything looks good but does not give him specific values.  Discussed glucose and A1c goals. Discussed importance of checking CBGs and maintaining good CBG control to prevent long-term and short-term complications. Explained how hyperglycemia leads to damage within blood vessels which lead to the common complications seen with uncontrolled diabetes.  Discussed impact of nutrition, exercise, stress, sickness, and medications on diabetes control. Discussed carbohydrates, carbohydrate goals per day and meal, along with portion sizes. Patient eats only 1 meal a day. Discussed importance of consistent meal intake and discussed options for supplements and snacks.  Told patient to check glucose 2 times per day (fasting and alternating second check) and to keep a log book of glucose readings and insulin taken which will need to be taken to doctor appointments. Explained how the doctor can use the log book to continue to make insulin adjustments if needed.   Discussed with patient on how to use an insulin pen. Attached step by step instructions on how to operate pen. Patient to call wife this afternoon and give RN wife's email address so we can sed link of insulin pen administration to them for discharge preparation.  Based on patient's insurance Basaglar is the preferred basal  insulin at time of d/c. Patient does not prefer to take Metformin at time of d/c.   At time of d/c patient will need:   Kara Mead order # [id number]  - Insulin pen needles order # [id number]  - Blood glucose meter kit order # [id number]  Thanks,  Tama Headings RN, MSN, BC-ADM Inpatient Diabetes Coordinator Team Pager [phone] (8a-5p)

## 2019-01-27 NOTE — Progress Notes (Signed)
Pt rec'd in transfer from [facility] via [facility], moved to bed x 1 assist. Pt A&O x 4, oriented to room and unit. Pt verbalizes understanding of pain scale, reports surgical site pain 5/10- declines RX intervention at this time. Pt agrees to inform staff if pain worsens, or if SOB manifest. Pt acknowledges need to inform staff before getting OOB. Plan of care discussed, questions answered to pt satisfaction.

## 2019-01-27 NOTE — Progress Notes (Addendum)
[address]       [place] [address]             [phone]      1 Day Post-Op Procedure(s) (LRB): VIDEO BRONCHOSCOPY (N/A) VIDEO ASSISTED THORACOSCOPY (VATS)/DRAINAGE OF EMPYEMA  AND DECORTICATION (Left) TRANSESOPHAGEAL ECHOCARDIOGRAM (TEE) (N/A)   Subjective:  No complaints.  Pain control is adequate.  He wants all the IVs removed. Wants foley out   Objective: Vital signs in last 24 hours: Temp:  [97.8 F (36.6 C)-97.9 F (36.6 C)] 97.8 F (36.6 C) (06/05 0400) Pulse Rate:  [57-90] 70 (06/05 0700) Cardiac Rhythm: Normal sinus rhythm (06/05 0000) Resp:  [9-27] 18 (06/05 0700) BP: (107-164)/(68-116) 130/116 (06/05 0400) SpO2:  [83 %-100 %] 95 % (06/05 0700) Arterial Line BP: (101-168)/(53-83) 122/71 (06/05 0700)  Intake/Output from previous day: 06/04 0701 - 06/05 0700 In: 3220.8 [P.O.:240; I.V.:1604.7; IV Piggyback:1376.1] Out: 2298 [Urine:1865; Blood:150; Chest Tube:83]  General appearance: alert, cooperative and no distress Heart: regular rate and rhythm Lungs: diminished breath sounds on left Wound: clean and dry, staples in place  Lab Results: Recent Labs    01/26/19 0533  01/26/19 1002 01/27/19 0348  WBC 12.9*  --   --  17.3*  HGB 9.4*   < > 10.2* 10.1*  HCT 29.1*   < > 30.0* 31.1*  PLT 639*  --   --  759*   < > = values in this interval not displayed.   BMET:  Recent Labs    01/26/19 0533  01/26/19 1002 01/27/19 0348  NA 129*   < > 131* 130*  K 3.2*   < > 3.6 3.9  CL 92*  --   --  93*  CO2 24  --   --  25  GLUCOSE 210*  --   --  237*  BUN 5*  --   --  11  CREATININE 0.73  --   --  0.62  CALCIUM 8.1*  --   --  8.5*   < > = values in this interval not displayed.    PT/INR:  Recent Labs    01/24/19 2107  LABPROT 15.5*  INR 1.3*   ABG    Component Value Date/Time   PHART 7.434 01/27/2019 0350   HCO3 28.3 (H) 01/27/2019 0350   TCO2 31 01/26/2019 1002   O2SAT 95.7 01/27/2019 0350   CBG (last 3)  Recent Labs     01/26/19 2027 01/26/19 2303 01/27/19 0346  GLUCAP 293* 267* 229*    Assessment/Plan: S/P Procedure(s) (LRB): VIDEO BRONCHOSCOPY (N/A) VIDEO ASSISTED THORACOSCOPY (VATS)/DRAINAGE OF EMPYEMA  AND DECORTICATION (Left) TRANSESOPHAGEAL ECHOCARDIOGRAM (TEE) (N/A)  1. Chest tube- no air leak, 83 cc output yesterday, CXR with continued pleural thickening some improvement in pleural effusion, leave chest tubes in place 2. ID- OR cultures have been obtained will take a few days to get results, continue Vanc, Zosyn per medicine 3. CV- hemodynamically stable  4. D/C Arterial line 5. D/C Foley catheter per patient request 6. D/C PCA and IV fluids per patient request 7. Lovenox for DVT prophylaxis 5. Dispo- patient stable, transfer to [facility] will notify medicine service patient is out of IC   LOS: 3 days    Erin Rumbold PA-C 01/27/2019  empyema draine yellow puss c/w staph or strep    AFB smear negative The patient does not have evidence of TB so will lift respiratory isolation  patient examined and medical record reviewed,agree with above  note. Tharon Aquas Trigt III 01/27/2019

## 2019-01-27 NOTE — Progress Notes (Signed)
PROGRESS NOTE    Gary Hudson  NID:[id number] DOB: 09-Mar-1966 DOA: 01/23/2019 PCP: Patient, No Pcp Per   Brief Narrative:  53 year old male with a past medical history of tobacco use, diabetes mellitus type 2, essential hypertension, hypothyroidism came to the hospital with exertional shortness of breath with productive cough.  COVID was negative.  Chest x-ray showed concerns for a loculated effusion on the left side.CT scan showed worsening of cavitary mass with left-sided pleural effusion.  Cardiothoracic and pulmonary team were consulted pulmonary team recommended to check for TB with QuantiFERON.  Antibiotics were changed to Zosyn.  Cardiothoracic scheduled for decortication and VATS procedure.  Initially was also in DKA transitioned to subcutaneous insulin and then transition to subcutaneous insulin.  He underwent VATS/bronchoscopy on 6/4 when significant amount of purulent drainage was removed and chest tube was placed.   Assessment & Plan:   Principal Problem:   Sepsis ([place]) Active Problems:   Loculated pleural effusion   Uncontrolled type 2 diabetes mellitus with hyperglycemia (HCC)   Essential hypertension   Status post thoracotomy  Sepsis secondary to left-sided loculated effusion with cavitary pneumonia, present prior to admission Status post VATS/thoracoscopy with drainage of empyema and decortication -Continue IV vancomycin and Zosyn day 4.  Pulmonary input appreciated.follow-up culture data -CTA of the chest reviewed.  Chest tube management per CT surgery team. - CT surgery team following. -Initial goal QuantiFERON indeterminate.  All of AFB results are pending. Airborne precaution discontinued by CT surgery this morning .  Awaiting pulmonary input. -Foley catheter is to be discontinued.  Arterial line discontinued 6/5.  Diabetic ketoacidosis, resolved Uncontrolled diabetes mellitus type 2 - Diabetic coordinator following.  Continue current regimen.  Patient does not  want to be on metformin at the time of discharge.  Will have to make arrangements for a basic lar at the time of discharge, follow-up their note from 6/5. - Closely monitor. -Hemoglobin A1c 11.4  Essential hypertension - IV as needed hydralazine.  Resume home p.o. meds when appropriate  Chronic normocytic anemia -Hemoglobin stable.  Continue to monitor  Hypothyroidism -Continue Synthroid.  TSH within normal limits  Tobacco use - Claims quit several weeks ago  Hyperlipidemia - Continue statin   DVT prophylaxis: SCDs Code Status: Full code Family Communication: None at bedside Disposition Plan: To be determined  Consultants:   Critical care  Cardiothoracic surgery  Procedures:   VATS/decortication/drainage of empyema/thoracoscopy on 6/4  Antimicrobials:   Zosyn day 4   Subjective: Reports of overall discomfort and continues to ask he wants IV lines to be removed as they are uncomfortable for him.  Review of Systems Otherwise negative except as per HPI, including: General = no fevers, chills, dizziness, malaise, fatigue HEENT/EYES = negative for pain, redness, loss of vision, double vision, blurred vision, loss of hearing, sore throat, hoarseness, dysphagia Cardiovascular= negative for chest pain, palpitation, murmurs, lower extremity swelling Respiratory/lungs= negative for hemoptysis Gastrointestinal= negative for nausea, vomiting,, abdominal pain, melena, hematemesis Genitourinary= negative for Dysuria, Hematuria, Change in Urinary Frequency MSK = Negative for arthralgia, myalgias, Back Pain, Joint swelling  Neurology= Negative for headache, seizures, numbness, tingling  Psychiatry= Negative for anxiety, depression, suicidal and homocidal ideation Allergy/Immunology= Medication/Food allergy as listed  Skin= Negative for Rash, lesions, ulcers, itching   Objective: Vitals:   01/27/19 0645 01/27/19 0700 01/27/19 1100 01/27/19 1200  BP:    (!) 144/90  Pulse:  63 70 62 78  Resp: 10 18 15 17   Temp:  TempSrc:      SpO2: 97% 95% 95% 95%  Weight:      Height:        Intake/Output Summary (Last 24 hours) at 01/27/2019 1447 Last data filed at 01/27/2019 1100 Gross per 24 hour  Intake 2107.46 ml  Output 1193 ml  Net 914.46 ml   Filed Weights   01/24/19 0842 01/25/19 0636 01/26/19 0555  Weight: 82.6 kg 82.7 kg 81.2 kg    Examination: Constitutional: NAD, calm, comfortable Eyes: PERRL, lids and conjunctivae normal ENMT: Mucous membranes are moist. Posterior pharynx clear of any exudate or lesions.Normal dentition.  Neck: normal, supple, no masses, no thyromegaly Respiratory: Left chest wall Chest tube noted with slight diminished breath sounds at the bases. Cardiovascular: Regular rate and rhythm, no murmurs / rubs / gallops. No extremity edema. 2+ pedal pulses. No carotid bruits.  Abdomen: no tenderness, no masses palpated. No hepatosplenomegaly. Bowel sounds positive.  Musculoskeletal: no clubbing / cyanosis. No joint deformity upper and lower extremities. Good ROM, no contractures. Normal muscle tone.  Skin: no rashes, lesions, ulcers. No induration Neurologic: CN 2-12 grossly intact. Sensation intact, DTR normal. Strength 5/5 in all 4.  Psychiatric: Normal judgment and insight. Alert and oriented x 3. Normal mood.      Data Reviewed:   CBC: Recent Labs  Lab 01/23/19 2351 01/24/19 0504 01/25/19 0403 01/26/19 0533 01/26/19 0938 01/26/19 1002 01/27/19 0348  WBC 23.8* 19.0* 14.3* 12.9*  --   --  17.3*  NEUTROABS 19.0* 14.7*  --   --   --   --   --   HGB 10.7* 9.4* 9.4* 9.4* 9.2* 10.2* 10.1*  HCT 33.4* 29.4* 29.0* 29.1* 27.0* 30.0* 31.1*  MCV 85.4 86.0 85.3 84.8  --   --  85.4  PLT 734* 584* 595* 639*  --   --  016*   Basic Metabolic Panel: Recent Labs  Lab 01/24/19 1629 01/24/19 2107 01/25/19 0403 01/26/19 0533 01/26/19 0938 01/26/19 1002 01/27/19 0348  NA 131* 130* 130* 129* 133* 131* 130*  K 3.7 3.7 3.7 3.2* 3.3*  3.6 3.9  CL 97* 96* 94* 92*  --   --  93*  CO2 24 25 22 24   --   --  25  GLUCOSE 135* 150* 214* 210*  --   --  237*  BUN 14 13 13  5*  --   --  11  CREATININE 0.58* 0.67 0.80 0.73  --   --  0.62  CALCIUM 7.9* 7.8* 8.1* 8.1*  --   --  8.5*  MG  --   --  1.9 1.8  --   --  2.1   GFR: Estimated Creatinine Clearance: 112.4 mL/min (by C-G formula based on SCr of 0.62 mg/dL). Liver Function Tests: Recent Labs  Lab 01/23/19 2351 01/24/19 0504  AST 21 19  ALT 21 18  ALKPHOS 231* 176*  BILITOT 1.1 1.1  PROT 7.6 6.2*  ALBUMIN 2.3* 1.8*   No results for input(s): LIPASE, AMYLASE in the last 168 hours. No results for input(s): AMMONIA in the last 168 hours. Coagulation Profile: Recent Labs  Lab 01/23/19 2351 01/24/19 2107  INR 1.2 1.3*   Cardiac Enzymes: Recent Labs  Lab 01/24/19 0857  TROPONINI <0.03   BNP (last 3 results) No results for input(s): PROBNP in the last 8760 hours. HbA1C: Recent Labs    01/25/19 0403  HGBA1C 11.4*   CBG: Recent Labs  Lab 01/26/19 2027 01/26/19 2303 01/27/19 0346 01/27/19 1020 01/27/19  1226  GLUCAP 293* 267* 229* 211* 201*   Lipid Profile: No results for input(s): CHOL, HDL, LDLCALC, TRIG, CHOLHDL, LDLDIRECT in the last 72 hours. Thyroid Function Tests: Recent Labs    01/25/19 0403  TSH 2.880   Anemia Panel: No results for input(s): VITAMINB12, FOLATE, FERRITIN, TIBC, IRON, RETICCTPCT in the last 72 hours. Sepsis Labs: Recent Labs  Lab 01/23/19 2352 01/24/19 0146 01/24/19 0504 01/24/19 0857  PROCALCITON  --   --  3.78  --   LATICACIDVEN 2.5* 1.4 1.2 1.5    Recent Results (from the past 240 hour(s))  Culture, blood (Routine x 2)     Status: None (Preliminary result)   Collection Time: 01/23/19 11:30 PM  Result Value Ref Range Status   Specimen Description BLOOD LEFT ARM  Final   Special Requests   Final    BOTTLES DRAWN AEROBIC AND ANAEROBIC Blood Culture results may not be optimal due to an excessive volume of blood  received in culture bottles   Culture   Final    NO GROWTH 3 DAYS Performed at [facility], [place] [address] [place], [place] [address]    Report Status PENDING  Incomplete  Culture, blood (Routine x 2)     Status: None (Preliminary result)   Collection Time: 01/23/19 11:48 PM  Result Value Ref Range Status   Specimen Description BLOOD RIGHT ARM  Final   Special Requests   Final    BOTTLES DRAWN AEROBIC ONLY Blood Culture results may not be optimal due to an excessive volume of blood received in culture bottles   Culture   Final    NO GROWTH 3 DAYS Performed at [facility], [place] [address], [place], [place] [address]    Report Status PENDING  Incomplete  SARS Coronavirus 2 (CEPHEID- Performed in [place] hospital lab), Hosp Order     Status: None   Collection Time: 01/24/19  1:48 AM  Result Value Ref Range Status   SARS Coronavirus 2 NEGATIVE NEGATIVE Final    Comment: (NOTE) If result is NEGATIVE SARS-CoV-2 target nucleic acids are NOT DETECTED. The SARS-CoV-2 RNA is generally detectable in upper and lower  respiratory specimens during the acute phase of infection. The lowest  concentration of SARS-CoV-2 viral copies this assay can detect is 250  copies / mL. A negative result does not preclude SARS-CoV-2 infection  and should not be used as the sole basis for treatment or other  patient management decisions.  A negative result may occur with  improper specimen collection / handling, submission of specimen other  than nasopharyngeal swab, presence of viral mutation(s) within the  areas targeted by this assay, and inadequate number of viral copies  (<250 copies / mL). A negative result must be combined with clinical  observations, patient history, and epidemiological information. If result is POSITIVE SARS-CoV-2 target nucleic acids are DETECTED. The SARS-CoV-2 RNA is generally detectable in upper and lower  respiratory specimens dur ing the acute phase of  infection.  Positive  results are indicative of active infection with SARS-CoV-2.  Clinical  correlation with patient history and other diagnostic information is  necessary to determine patient infection status.  Positive results do  not rule out bacterial infection or co-infection with other viruses. If result is PRESUMPTIVE POSTIVE SARS-CoV-2 nucleic acids MAY BE PRESENT.   A presumptive positive result was obtained on the submitted specimen  and confirmed on repeat testing.  While 2019 novel coronavirus  (SARS-CoV-2) nucleic acids  may be present in the submitted sample  additional confirmatory testing may be necessary for epidemiological  and / or clinical management purposes  to differentiate between  SARS-CoV-2 and other Sarbecovirus currently known to infect humans.  If clinically indicated additional testing with an alternate test  methodology [phone]) is advised. The SARS-CoV-2 RNA is generally  detectable in upper and lower respiratory sp ecimens during the acute  phase of infection. The expected result is Negative. Fact Sheet for Patients:  StrictlyIdeas.no Fact Sheet for Healthcare Providers: BankingDealers.co.za This test is not yet approved or cleared by the [place] FDA and has been authorized for detection and/or diagnosis of SARS-CoV-2 by FDA under an Emergency Use Authorization (EUA).  This EUA will remain in effect (meaning this test can be used) for the duration of the COVID-19 declaration under Section 564(b)(1) of the Act, 21 U.S.C. section 360bbb-3(b)(1), unless the authorization is terminated or revoked sooner. Performed at [facility], [place] [address] [place], [place] [address]   Surgical pcr screen     Status: None   Collection Time: 01/24/19  6:04 PM  Result Value Ref Range Status   MRSA, PCR NEGATIVE NEGATIVE Final   Staphylococcus aureus NEGATIVE NEGATIVE Final    Comment: (NOTE) The Xpert SA  Assay (FDA approved for NASAL specimens in patients 26 years of age and older), is one component of a comprehensive surveillance program. It is not intended to diagnose infection nor to guide or monitor treatment. Performed at [facility], [place] [address], [place], [place] [address]   Body fluid culture     Status: None (Preliminary result)   Collection Time: 01/26/19  9:37 AM  Result Value Ref Range Status   Specimen Description PLEURAL LEFT  Final   Special Requests PATIENT ON FOLLOWING ANCEF ZOSYN  Final   Gram Stain   Final    ABUNDANT WBC PRESENT,BOTH PMN AND MONONUCLEAR NO ORGANISMS SEEN    Culture   Final    NO GROWTH < 24 HOURS Performed at [facility], [place] [address] [place], [place] [address]    Report Status PENDING  Incomplete  Acid Fast Smear (AFB)     Status: None   Collection Time: 01/26/19  9:37 AM  Result Value Ref Range Status   AFB Specimen Processing Concentration  Final   Acid Fast Smear Negative  Final    Comment: (NOTE) Performed At: [facility] [place], [place] [id number] Rush Farmer MD QH:[id number]    Source (AFB) PLEURAL  Final    Comment: LEFT Performed at [facility], [place] [address] [place], [place] [address]   Body fluid culture     Status: None (Preliminary result)   Collection Time: 01/26/19  9:41 AM  Result Value Ref Range Status   Specimen Description PLEURAL LEFT SPEC B ON SWABS  Final   Special Requests PATIENT ON FOLLOWING ANCEF ZOSYN  Final   Gram Stain NO WBC SEEN NO ORGANISMS SEEN   Final   Culture   Final    NO GROWTH < 24 HOURS Performed at [facility], [place] [address], [place], [place] [address]    Report Status PENDING  Incomplete  Aerobic/Anaerobic Culture (surgical/deep wound)     Status: None (Preliminary result)   Collection Time: 01/26/19  9:50 AM  Result Value Ref Range Status   Specimen Description TISSUE LEFT PLEURAL  Final   Special Requests PATIENT ON  FOLLOWING ANCEF ZOSYN  Final   Gram Stain   Final    RARE WBC PRESENT, PREDOMINANTLY PMN NO ORGANISMS SEEN    Culture   Final    NO GROWTH 1 DAY Performed at [place] [address] [place], [place] [address]    Report Status PENDING  Incomplete         Radiology Studies: Dg Chest Port 1 View  Result Date: 01/27/2019 CLINICAL DATA:  Chest tube.  Empyema EXAM: PORTABLE CHEST 1 VIEW COMPARISON:  Yesterday FINDINGS: Left-sided chest tube in place. No visible pneumothorax or increasing pleural fluid. There is mild increased parenchymal opacity at the left base with obscured diaphragm. There is residual airspace and pleural opacity on the left with hazy appearance over the mid chest. IMPRESSION: Mild increased parenchymal opacity at the left base. No visible pneumothorax or increasing pleural fluid. Electronically Signed   By: Monte Fantasia M.D.   On: 01/27/2019 08:57   Dg Chest Portable 1 View  Result Date: 01/26/2019 CLINICAL DATA:  Status post left VATS EXAM: PORTABLE CHEST 1 VIEW COMPARISON:  01/23/2019 FINDINGS: Cardiac shadow is within normal limits. Endotracheal tube extends into the left mainstem bronchus. Two chest tubes are noted on the left with evacuation of previously seen empyema. Focal atelectatic changes in the left upper lobe are noted. No other focal abnormality is seen. IMPRESSION: No pneumothorax is noted. Chest tubes on the left with evacuation of previously seen empyema. Increased density in the left apex likely representing atelectasis. Electronically Signed   By: Inez Catalina M.D.   On: 01/26/2019 11:34        Scheduled Meds: . acetaminophen  1,000 mg Oral Q6H   Or  . acetaminophen (TYLENOL) oral liquid 160 mg/5 mL  1,000 mg Oral Q6H  . albuterol  2 puff Inhalation Q6H  . atorvastatin  20 mg Oral q1800  . bisacodyl  10 mg Oral Daily  . chlorhexidine  15 mL Mouth/Throat QID  . Chlorhexidine Gluconate Cloth  6 each Topical Daily  . enoxaparin  (LOVENOX) injection  40 mg Subcutaneous Q24H  . feeding supplement (ENSURE ENLIVE)  237 mL Oral BID BM  . insulin aspart  0-24 Units Subcutaneous Q4H  . insulin glargine  16 Units Subcutaneous Daily  . levothyroxine  25 mcg Oral Q0600  . mouth rinse  15 mL Mouth Rinse BID  . senna-docusate  1 tablet Oral QHS  . sodium chloride flush  3 mL Intravenous Once   Continuous Infusions: . piperacillin-tazobactam (ZOSYN)  IV 3.375 g (01/27/19 0941)  . potassium chloride    . vancomycin 250 mL/hr at 01/27/19 0700     LOS: 3 days   Time spent= 35 mins     Arsenio Loader, MD [facility]  If 7PM-7AM, please contact night-coverage [email or web address] 01/27/2019, 2:47 PM

## 2019-01-28 ENCOUNTER — Inpatient Hospital Stay (HOSPITAL_COMMUNITY): Payer: 59

## 2019-01-28 LAB — CBC
HCT: 29.3 % — ABNORMAL LOW (ref 39.0–52.0)
Hemoglobin: 9.3 g/dL — ABNORMAL LOW (ref 13.0–17.0)
MCH: 27.3 pg (ref 26.0–34.0)
MCHC: 31.7 g/dL (ref 30.0–36.0)
MCV: 85.9 fL (ref 80.0–100.0)
Platelets: 731 10*3/uL — ABNORMAL HIGH (ref 150–400)
RBC: 3.41 MIL/uL — ABNORMAL LOW (ref 4.22–5.81)
RDW: 13.9 % (ref 11.5–15.5)
WBC: 11.5 10*3/uL — ABNORMAL HIGH (ref 4.0–10.5)
nRBC: 0 % (ref 0.0–0.2)

## 2019-01-28 LAB — TYPE AND SCREEN
ABO/RH(D): A POS
Antibody Screen: NEGATIVE
Unit division: 0
Unit division: 0

## 2019-01-28 LAB — COMPREHENSIVE METABOLIC PANEL
ALT: 24 U/L (ref 0–44)
AST: 25 U/L (ref 15–41)
Albumin: 1.7 g/dL — ABNORMAL LOW (ref 3.5–5.0)
Alkaline Phosphatase: 156 U/L — ABNORMAL HIGH (ref 38–126)
Anion gap: 9 (ref 5–15)
BUN: 10 mg/dL (ref 6–20)
CO2: 28 mmol/L (ref 22–32)
Calcium: 8.2 mg/dL — ABNORMAL LOW (ref 8.9–10.3)
Chloride: 98 mmol/L (ref 98–111)
Creatinine, Ser: 0.55 mg/dL — ABNORMAL LOW (ref 0.61–1.24)
GFR calc Af Amer: >60 mL/min (ref >60)
GFR calc non Af Amer: >60 mL/min (ref >60)
Glucose, Bld: 227 mg/dL — ABNORMAL HIGH (ref 70–99)
Potassium: 3.2 mmol/L — ABNORMAL LOW (ref 3.5–5.1)
Sodium: 135 mmol/L (ref 135–145)
Total Bilirubin: 0.2 mg/dL — ABNORMAL LOW (ref 0.3–1.2)
Total Protein: 5.4 g/dL — ABNORMAL LOW (ref 6.5–8.1)

## 2019-01-28 LAB — BPAM RBC
Blood Product Expiration Date: 202006182359
Blood Product Expiration Date: 202006182359
ISSUE DATE / TIME: 202006040759
ISSUE DATE / TIME: 202006040759
Unit Type and Rh: 6200
Unit Type and Rh: 6200

## 2019-01-28 LAB — GLUCOSE, CAPILLARY
Glucose-Capillary: 193 mg/dL — ABNORMAL HIGH (ref 70–99)
Glucose-Capillary: 197 mg/dL — ABNORMAL HIGH (ref 70–99)
Glucose-Capillary: 206 mg/dL — ABNORMAL HIGH (ref 70–99)
Glucose-Capillary: 214 mg/dL — ABNORMAL HIGH (ref 70–99)
Glucose-Capillary: 241 mg/dL — ABNORMAL HIGH (ref 70–99)
Glucose-Capillary: 265 mg/dL — ABNORMAL HIGH (ref 70–99)

## 2019-01-28 LAB — MAGNESIUM: Magnesium: 1.9 mg/dL (ref 1.7–2.4)

## 2019-01-28 MED ORDER — POTASSIUM CHLORIDE CRYS ER 20 MEQ PO TBCR
40.0000 meq | EXTENDED_RELEASE_TABLET | Freq: Once | ORAL | Status: AC
  Administered 2019-01-28: 06:00:00 40 meq via ORAL
  Filled 2019-01-28: qty 2

## 2019-01-28 NOTE — Progress Notes (Addendum)
      [address]       [place] [address]             [phone]       2 Days Post-Op Procedure(s) (LRB): VIDEO BRONCHOSCOPY (N/A) VIDEO ASSISTED THORACOSCOPY (VATS)/DRAINAGE OF EMPYEMA  AND DECORTICATION (Left) TRANSESOPHAGEAL ECHOCARDIOGRAM (TEE) (N/A)  Subjective: Patient with some pain at chest tube sites.  Objective: Vital signs in last 24 hours: Temp:  [97.7 F (36.5 C)-98.2 F (36.8 C)] 97.7 F (36.5 C) (06/06 0936) Pulse Rate:  [62-103] 87 (06/06 0936) Cardiac Rhythm: Normal sinus rhythm (06/06 0700) Resp:  [14-19] 19 (06/06 0936) BP: (128-144)/(72-90) 138/72 (06/06 0936) SpO2:  [95 %-99 %] 99 % (06/06 0936)      Intake/Output from previous day: 06/05 0701 - 06/06 0700 In: 1081 [P.O.:360; IV Piggyback:721] Out: 764 [Urine:750; Chest Tube:14]   Physical Exam:  Cardiovascular: RRR Pulmonary: Clear to auscultation on right and diminished left basilar breath sounds Abdomen: Soft, non tender, bowel sounds present. Extremities: No lower extremity edema. Wounds: Clean and dry.  No erythema or signs of infection. Chest Tubes: to suction, no air leak  Lab Results: CBC: Recent Labs    01/27/19 0348 01/28/19 0233  WBC 17.3* 11.5*  HGB 10.1* 9.3*  HCT 31.1* 29.3*  PLT 759* 731*   BMET:  Recent Labs    01/27/19 0348 01/28/19 0233  NA 130* 135  K 3.9 3.2*  CL 93* 98  CO2 25 28  GLUCOSE 237* 227*  BUN 11 10  CREATININE 0.62 0.55*  CALCIUM 8.5* 8.2*    PT/INR: No results for input(s): LABPROT, INR in the last 72 hours. ABG:  INR: Will add last result for INR, ABG once components are confirmed Will add last 4 CBG results once components are confirmed  Assessment/Plan:  1. CV - SR in the 80's 2.  Pulmonary - On room air. Chest tube with scant output last 24 hours. Chest tubes are  to and there is no air leak. CXR this am shows no pneumothorax, possible increase in left pleural consolidation. Hope to remove 1 chest tube today.  Check CXR in am.Encourage incentive spirometer. 3. ID-on Vancomycin and Zosyn. Culture showed no growth to date. 4. Anemia-H and H this am 9.3 and 29.3 5. Supplement potassium 6. Hypothyroidism-on Levothyroxine 25 mcg daily 7. DM- CBGs 214/193/197. On Insulin. He was on Metformin and Glimepiride prior to surgery. Per primary  Donielle M ZimmermanPA-C 01/28/2019,10:01 AM [phone]  Patient seen and examined, agree with above Dc posterior chest tube, keep Blake drain  [place] C. Roxan Hockey, MD [facility] [phone]

## 2019-01-28 NOTE — Progress Notes (Signed)
PROGRESS NOTE    Gary Hudson  XNA:[id number] DOB: 15-Apr-1966 DOA: 01/23/2019 PCP: Patient, No Pcp Per   Brief Narrative:  53 year old male with a past medical history of tobacco use, diabetes mellitus type 2, essential hypertension, hypothyroidism came to the hospital with exertional shortness of breath with productive cough.  COVID was negative.  Chest x-ray showed concerns for a loculated effusion on the left side.CT scan showed worsening of cavitary mass with left-sided pleural effusion.  Cardiothoracic and pulmonary team were consulted pulmonary team recommended to check for TB with QuantiFERON.  Antibiotics were changed to Zosyn.  Cardiothoracic scheduled for decortication and VATS procedure.  Initially was also in DKA transitioned to subcutaneous insulin and then transition to subcutaneous insulin.  He underwent VATS/bronchoscopy on 6/4 when significant amount of purulent drainage was removed and chest tube was placed.  01/28/2019: Cardiothoracic input is appreciated.  As per cardiothoracic surgery note, 1 of the chest tubes will be removed today.  We will continue antibiotics for now.  Cultures reviewed.  Patient seen.  No new complaints.  No fever or chills.   Assessment & Plan:   Principal Problem:   Sepsis ([place]) Active Problems:   Loculated pleural effusion   Uncontrolled type 2 diabetes mellitus with hyperglycemia (HCC)   Essential hypertension   Status post thoracotomy  Sepsis secondary to left-sided loculated effusion with cavitary pneumonia, present prior to admission Status post VATS/thoracoscopy with drainage of empyema and decortication -Continue IV vancomycin and Zosyn day 4.  Pulmonary input appreciated.follow-up culture data -CTA of the chest reviewed.  Chest tube management per CT surgery team. - CT surgery team following. -Initial goal QuantiFERON indeterminate.  All of AFB results are pending. Airborne precaution discontinued by CT surgery this morning .  Awaiting  pulmonary input. -Foley catheter is to be discontinued.  Arterial line discontinued 6/5. 01/28/2019: Cardiothoracic input is appreciated.  As per cardiothoracic surgery note, 1 of the chest tubes will be removed today.  We will continue antibiotics for now (IV vancomycin and Zosyn).  Cultures reviewed.  Patient seen.  No new complaints.  No fever or chills.  Further management depend on hospital course.   Diabetic ketoacidosis, resolved Uncontrolled diabetes mellitus type 2 - Diabetic coordinator following.  Continue current regimen.  Patient does not want to be on metformin at the time of discharge.  Will have to make arrangements for a basic lar at the time of discharge, follow-up their note from 6/5. - Closely monitor. -Hemoglobin A1c 11.4 01/28/2019: Currently, patient is on subcutaneous Lantus insulin 16 units daily, as well as, sliding scale insulin coverage.  Continue to monitor blood sugar closely.  Essential hypertension - IV as needed hydralazine.  Resume home p.o. meds when appropriate Blood pressure control is optimized.  Patient's blood pressure is 128/83 mmHg.  Chronic normocytic anemia -Hemoglobin stable.  Continue to monitor  Hypothyroidism -Continue Synthroid.  TSH within normal limits  Tobacco use - Claims quit several weeks ago  Hyperlipidemia - Continue statin   DVT prophylaxis: SCDs Code Status: Full code Family Communication: None at bedside Disposition Plan: To be determined  Consultants:   Critical care  Cardiothoracic surgery  Procedures:   VATS/decortication/drainage of empyema/thoracoscopy on 6/4  Antimicrobials:   Zosyn day 4   Subjective: No fever or chills. No productive cough.  Objective: Vitals:   01/28/19 0403 01/28/19 0732 01/28/19 0936 01/28/19 1113  BP: 129/78  138/72 128/83  Pulse: 78  87   Resp: 18  19   Temp:  97.7 F (36.5 C)  97.7 F (36.5 C) 98 F (36.7 C)  TempSrc: Oral  Oral Oral  SpO2: 98% 99% 99%   Weight:       Height:        Intake/Output Summary (Last 24 hours) at 01/28/2019 1158 Last data filed at 01/28/2019 0900 Gross per 24 hour  Intake 1334.35 ml  Output 1114 ml  Net 220.35 ml   Filed Weights   01/24/19 0842 01/25/19 0636 01/26/19 0555  Weight: 82.6 kg 82.7 kg 81.2 kg    Examination: Constitutional: NAD, calm, comfortable Eyes: PERRL, lids and conjunctivae normal ENMT: Mucous membranes are moist. Posterior pharynx clear of any exudate or lesions.Normal dentition.  Neck: normal, supple, no masses, no thyromegaly Respiratory: Left chest wall Chest tube.  Diminished air entry with mild expiratory wheeze.  Cardiovascular: S1-S2.   Abdomen: no tenderness, no masses palpated. No hepatosplenomegaly. Bowel sounds positive.  Musculoskeletal: No leg edema. Neurologic: Awake, alert.  Patient moves all extremities.  Data Reviewed:   CBC: Recent Labs  Lab 01/23/19 2351 01/24/19 0504 01/25/19 0403 01/26/19 0533 01/26/19 4742 01/26/19 1002 01/27/19 0348 01/28/19 0233  WBC 23.8* 19.0* 14.3* 12.9*  --   --  17.3* 11.5*  NEUTROABS 19.0* 14.7*  --   --   --   --   --   --   HGB 10.7* 9.4* 9.4* 9.4* 9.2* 10.2* 10.1* 9.3*  HCT 33.4* 29.4* 29.0* 29.1* 27.0* 30.0* 31.1* 29.3*  MCV 85.4 86.0 85.3 84.8  --   --  85.4 85.9  PLT 734* 584* 595* 639*  --   --  759* 595*   Basic Metabolic Panel: Recent Labs  Lab 01/24/19 2107 01/25/19 0403 01/26/19 0533 01/26/19 0938 01/26/19 1002 01/27/19 0348 01/28/19 0233  NA 130* 130* 129* 133* 131* 130* 135  K 3.7 3.7 3.2* 3.3* 3.6 3.9 3.2*  CL 96* 94* 92*  --   --  93* 98  CO2 25 22 24   --   --  25 28  GLUCOSE 150* 214* 210*  --   --  237* 227*  BUN 13 13 5*  --   --  11 10  CREATININE 0.67 0.80 0.73  --   --  0.62 0.55*  CALCIUM 7.8* 8.1* 8.1*  --   --  8.5* 8.2*  MG  --  1.9 1.8  --   --  2.1 1.9   GFR: Estimated Creatinine Clearance: 112.4 mL/min (A) (by C-G formula based on SCr of 0.55 mg/dL (L)). Liver Function Tests: Recent Labs  Lab  01/23/19 2351 01/24/19 0504 01/28/19 0233  AST 21 19 25   ALT 21 18 24   ALKPHOS 231* 176* 156*  BILITOT 1.1 1.1 0.2*  PROT 7.6 6.2* 5.4*  ALBUMIN 2.3* 1.8* 1.7*   No results for input(s): LIPASE, AMYLASE in the last 168 hours. No results for input(s): AMMONIA in the last 168 hours. Coagulation Profile: Recent Labs  Lab 01/23/19 2351 01/24/19 2107  INR 1.2 1.3*   Cardiac Enzymes: Recent Labs  Lab 01/24/19 0857  TROPONINI <0.03   BNP (last 3 results) No results for input(s): PROBNP in the last 8760 hours. HbA1C: No results for input(s): HGBA1C in the last 72 hours. CBG: Recent Labs  Lab 01/27/19 2032 01/28/19 0019 01/28/19 0402 01/28/19 0813 01/28/19 1123  GLUCAP 178* 214* 193* 197* 265*   Lipid Profile: No results for input(s): CHOL, HDL, LDLCALC, TRIG, CHOLHDL, LDLDIRECT in the last 72 hours. Thyroid Function Tests: No results  for input(s): TSH, T4TOTAL, FREET4, T3FREE, THYROIDAB in the last 72 hours. Anemia Panel: No results for input(s): VITAMINB12, FOLATE, FERRITIN, TIBC, IRON, RETICCTPCT in the last 72 hours. Sepsis Labs: Recent Labs  Lab 01/23/19 2352 01/24/19 0146 01/24/19 0504 01/24/19 0857  PROCALCITON  --   --  3.78  --   LATICACIDVEN 2.5* 1.4 1.2 1.5    Recent Results (from the past 240 hour(s))  Culture, blood (Routine x 2)     Status: None (Preliminary result)   Collection Time: 01/23/19 11:30 PM  Result Value Ref Range Status   Specimen Description BLOOD LEFT ARM  Final   Special Requests   Final    BOTTLES DRAWN AEROBIC AND ANAEROBIC Blood Culture results may not be optimal due to an excessive volume of blood received in culture bottles   Culture   Final    NO GROWTH 4 DAYS Performed at [facility] [address], [place], [place] [address]    Report Status PENDING  Incomplete  Culture, blood (Routine x 2)     Status: None (Preliminary result)   Collection Time: 01/23/19 11:48 PM  Result Value Ref Range Status   Specimen  Description BLOOD RIGHT ARM  Final   Special Requests   Final    BOTTLES DRAWN AEROBIC ONLY Blood Culture results may not be optimal due to an excessive volume of blood received in culture bottles   Culture   Final    NO GROWTH 4 DAYS Performed at [facility], [place] [address], [place], [place] [address]    Report Status PENDING  Incomplete  SARS Coronavirus 2 (CEPHEID- Performed in [facility] lab), Hosp Order     Status: None   Collection Time: 01/24/19  1:48 AM  Result Value Ref Range Status   SARS Coronavirus 2 NEGATIVE NEGATIVE Final    Comment: (NOTE) If result is NEGATIVE SARS-CoV-2 target nucleic acids are NOT DETECTED. The SARS-CoV-2 RNA is generally detectable in upper and lower  respiratory specimens during the acute phase of infection. The lowest  concentration of SARS-CoV-2 viral copies this assay can detect is 250  copies / mL. A negative result does not preclude SARS-CoV-2 infection  and should not be used as the sole basis for treatment or other  patient management decisions.  A negative result may occur with  improper specimen collection / handling, submission of specimen other  than nasopharyngeal swab, presence of viral mutation(s) within the  areas targeted by this assay, and inadequate number of viral copies  (<250 copies / mL). A negative result must be combined with clinical  observations, patient history, and epidemiological information. If result is POSITIVE SARS-CoV-2 target nucleic acids are DETECTED. The SARS-CoV-2 RNA is generally detectable in upper and lower  respiratory specimens dur ing the acute phase of infection.  Positive  results are indicative of active infection with SARS-CoV-2.  Clinical  correlation with patient history and other diagnostic information is  necessary to determine patient infection status.  Positive results do  not rule out bacterial infection or co-infection with other viruses. If result is PRESUMPTIVE  POSTIVE SARS-CoV-2 nucleic acids MAY BE PRESENT.   A presumptive positive result was obtained on the submitted specimen  and confirmed on repeat testing.  While 2019 novel coronavirus  (SARS-CoV-2) nucleic acids may be present in the submitted sample  additional confirmatory testing may be necessary for epidemiological  and / or clinical management purposes  to differentiate between  SARS-CoV-2 and  other Sarbecovirus currently known to infect humans.  If clinically indicated additional testing with an alternate test  methodology [phone]) is advised. The SARS-CoV-2 RNA is generally  detectable in upper and lower respiratory sp ecimens during the acute  phase of infection. The expected result is Negative. Fact Sheet for Patients:  StrictlyIdeas.no Fact Sheet for Healthcare Providers: BankingDealers.co.za This test is not yet approved or cleared by the [place] FDA and has been authorized for detection and/or diagnosis of SARS-CoV-2 by FDA under an Emergency Use Authorization (EUA).  This EUA will remain in effect (meaning this test can be used) for the duration of the COVID-19 declaration under Section 564(b)(1) of the Act, 21 U.S.C. section 360bbb-3(b)(1), unless the authorization is terminated or revoked sooner. Performed at [facility], [place] [address], [place], [place] [address]   Surgical pcr screen     Status: None   Collection Time: 01/24/19  6:04 PM  Result Value Ref Range Status   MRSA, PCR NEGATIVE NEGATIVE Final   Staphylococcus aureus NEGATIVE NEGATIVE Final    Comment: (NOTE) The Xpert SA Assay (FDA approved for NASAL specimens in patients 10 years of age and older), is one component of a comprehensive surveillance program. It is not intended to diagnose infection nor to guide or monitor treatment. Performed at [facility], [place] [address], [place], [place] [address]   Anaerobic culture     Status: None  (Preliminary result)   Collection Time: 01/26/19  9:37 AM  Result Value Ref Range Status   Specimen Description PLEURAL LEFT  Final   Special Requests   Final    PATIENT ON FOLLOWING ANCEF ZOSYN Performed at [facility], [place] [address] [place], [place] [address]    Culture   Final    NO ANAEROBES ISOLATED; CULTURE IN PROGRESS FOR 5 DAYS   Report Status PENDING  Incomplete  Body fluid culture     Status: None (Preliminary result)   Collection Time: 01/26/19  9:37 AM  Result Value Ref Range Status   Specimen Description PLEURAL LEFT  Final   Special Requests PATIENT ON FOLLOWING ANCEF ZOSYN  Final   Gram Stain   Final    ABUNDANT WBC PRESENT,BOTH PMN AND MONONUCLEAR NO ORGANISMS SEEN    Culture   Final    NO GROWTH 2 DAYS Performed at [facility], [place] [address] [place], [place] [address]    Report Status PENDING  Incomplete  Acid Fast Smear (AFB)     Status: None   Collection Time: 01/26/19  9:37 AM  Result Value Ref Range Status   AFB Specimen Processing Concentration  Final   Acid Fast Smear Negative  Final    Comment: (NOTE) Performed At: [facility] [place], [place] [id number] Rush Farmer MD IR:[id number]    Source (AFB) PLEURAL  Final    Comment: LEFT Performed at [facility], [place] [address], [place], [place] [address]   Anaerobic culture     Status: None (Preliminary result)   Collection Time: 01/26/19  9:41 AM  Result Value Ref Range Status   Specimen Description PLEURAL LEFT SPEC B ON SWABS  Final   Special Requests   Final    PATIENT ON FOLLOWING ANCEF ZOSYN Performed at [facility], [place] [address] [place], [place] [address]    Culture   Final    NO ANAEROBES ISOLATED; CULTURE IN PROGRESS FOR 5 DAYS   Report  Status PENDING  Incomplete  Body fluid culture     Status: None (Preliminary result)   Collection Time: 01/26/19  9:41 AM  Result Value Ref Range Status   Specimen Description PLEURAL LEFT SPEC  B ON SWABS  Final   Special Requests PATIENT ON FOLLOWING ANCEF ZOSYN  Final   Gram Stain NO WBC SEEN NO ORGANISMS SEEN   Final   Culture   Final    NO GROWTH 2 DAYS Performed at [facility], [place] [address] [place], [place] [address]    Report Status PENDING  Incomplete  Aerobic/Anaerobic Culture (surgical/deep wound)     Status: None (Preliminary result)   Collection Time: 01/26/19  9:50 AM  Result Value Ref Range Status   Specimen Description TISSUE LEFT PLEURAL  Final   Special Requests PATIENT ON FOLLOWING ANCEF ZOSYN  Final   Gram Stain   Final    RARE WBC PRESENT, PREDOMINANTLY PMN NO ORGANISMS SEEN    Culture   Final    NO GROWTH 2 DAYS Performed at [facility], [place] [address], [place], [place] [address]    Report Status PENDING  Incomplete  Acid Fast Smear (AFB)     Status: None   Collection Time: 01/26/19  9:50 AM  Result Value Ref Range Status   AFB Specimen Processing Comment  Final    Comment: Tissue Grinding and Digestion/Decontamination   Acid Fast Smear Negative  Final    Comment: (NOTE) Performed At: [facility] [place], [place] [id number] Rush Farmer MD OE:[id number]    Source (AFB) TISSUE  Final    Comment: LEFT LUNG Performed at [facility], [place] [address] [place], [place] [address]          Radiology Studies: Dg Chest Port 1 View  Result Date: 01/28/2019 CLINICAL DATA:  Patient with empyema. EXAM: PORTABLE CHEST 1 VIEW COMPARISON:  Chest radiograph 01/27/2019 FINDINGS: Monitoring leads overlie the patient. Stable position left chest tube. Increasing opacities within the left lower hemithorax. Mild increasing pleural fluid left lower hemithorax. No pneumothorax. IMPRESSION: Left chest tube remains in place. Mild increase in opacity within the left lung base. Suggestion of mild increased left pleural fluid. Electronically Signed   By: Lovey Newcomer M.D.   On: 01/28/2019 05:55   Dg Chest Port 1  View  Result Date: 01/27/2019 CLINICAL DATA:  Chest tube.  Empyema EXAM: PORTABLE CHEST 1 VIEW COMPARISON:  Yesterday FINDINGS: Left-sided chest tube in place. No visible pneumothorax or increasing pleural fluid. There is mild increased parenchymal opacity at the left base with obscured diaphragm. There is residual airspace and pleural opacity on the left with hazy appearance over the mid chest. IMPRESSION: Mild increased parenchymal opacity at the left base. No visible pneumothorax or increasing pleural fluid. Electronically Signed   By: Monte Fantasia M.D.   On: 01/27/2019 08:57        Scheduled Meds:  acetaminophen  1,000 mg Oral Q6H   Or   acetaminophen (TYLENOL) oral liquid 160 mg/5 mL  1,000 mg Oral Q6H   albuterol  2 puff Inhalation Q6H   atorvastatin  20 mg Oral q1800   bisacodyl  10 mg Oral Daily   chlorhexidine  15 mL Mouth/Throat QID   Chlorhexidine Gluconate Cloth  6 each Topical Daily   enoxaparin (LOVENOX) injection  40 mg Subcutaneous Q24H   feeding supplement (ENSURE ENLIVE)  237 mL Oral BID BM   insulin aspart  0-24  Units Subcutaneous Q4H   insulin glargine  16 Units Subcutaneous Daily   levothyroxine  25 mcg Oral Q0600   mouth rinse  15 mL Mouth Rinse BID   senna-docusate  1 tablet Oral QHS   sodium chloride flush  3 mL Intravenous Once   Continuous Infusions:  piperacillin-tazobactam (ZOSYN)  IV 3.375 g (01/28/19 0834)   potassium chloride     vancomycin 1,500 mg (01/28/19 0355)     LOS: 4 days   Time spent= 35 mins    Bonnell Public, MD [facility]  If 7PM-7AM, please contact night-coverage [email or web address] 01/28/2019, 11:58 AM

## 2019-01-29 ENCOUNTER — Inpatient Hospital Stay (HOSPITAL_COMMUNITY): Payer: 59

## 2019-01-29 LAB — GLUCOSE, CAPILLARY
Glucose-Capillary: 116 mg/dL — ABNORMAL HIGH (ref 70–99)
Glucose-Capillary: 126 mg/dL — ABNORMAL HIGH (ref 70–99)
Glucose-Capillary: 160 mg/dL — ABNORMAL HIGH (ref 70–99)
Glucose-Capillary: 162 mg/dL — ABNORMAL HIGH (ref 70–99)
Glucose-Capillary: 196 mg/dL — ABNORMAL HIGH (ref 70–99)

## 2019-01-29 LAB — BASIC METABOLIC PANEL
Anion gap: 8 (ref 5–15)
BUN: <5 mg/dL — ABNORMAL LOW (ref 6–20)
CO2: 30 mmol/L (ref 22–32)
Calcium: 8.5 mg/dL — ABNORMAL LOW (ref 8.9–10.3)
Chloride: 100 mmol/L (ref 98–111)
Creatinine, Ser: 0.59 mg/dL — ABNORMAL LOW (ref 0.61–1.24)
GFR calc Af Amer: >60 mL/min (ref >60)
GFR calc non Af Amer: >60 mL/min (ref >60)
Glucose, Bld: 132 mg/dL — ABNORMAL HIGH (ref 70–99)
Potassium: 3.6 mmol/L (ref 3.5–5.1)
Sodium: 138 mmol/L (ref 135–145)

## 2019-01-29 LAB — CULTURE, BLOOD (ROUTINE X 2)
Culture: NO GROWTH
Culture: NO GROWTH

## 2019-01-29 LAB — CBC
HCT: 29.9 % — ABNORMAL LOW (ref 39.0–52.0)
Hemoglobin: 9.5 g/dL — ABNORMAL LOW (ref 13.0–17.0)
MCH: 27.5 pg (ref 26.0–34.0)
MCHC: 31.8 g/dL (ref 30.0–36.0)
MCV: 86.4 fL (ref 80.0–100.0)
Platelets: 814 10*3/uL — ABNORMAL HIGH (ref 150–400)
RBC: 3.46 MIL/uL — ABNORMAL LOW (ref 4.22–5.81)
RDW: 14.3 % (ref 11.5–15.5)
WBC: 10.1 10*3/uL (ref 4.0–10.5)
nRBC: 0 % (ref 0.0–0.2)

## 2019-01-29 LAB — BODY FLUID CULTURE
Culture: NO GROWTH
Culture: NO GROWTH
Gram Stain: NONE SEEN

## 2019-01-29 LAB — MAGNESIUM: Magnesium: 1.8 mg/dL (ref 1.7–2.4)

## 2019-01-29 LAB — VANCOMYCIN, PEAK: Vancomycin Pk: 48 ug/mL — ABNORMAL HIGH (ref 30–40)

## 2019-01-29 MED ORDER — POTASSIUM CHLORIDE CRYS ER 20 MEQ PO TBCR
40.0000 meq | EXTENDED_RELEASE_TABLET | Freq: Once | ORAL | Status: AC
  Administered 2019-01-29: 40 meq via ORAL
  Filled 2019-01-29: qty 2

## 2019-01-29 MED ORDER — LOSARTAN POTASSIUM 25 MG PO TABS
25.0000 mg | ORAL_TABLET | Freq: Every day | ORAL | Status: DC
  Administered 2019-01-29 – 2019-01-31 (×3): 25 mg via ORAL
  Filled 2019-01-29 (×3): qty 1

## 2019-01-29 NOTE — Progress Notes (Signed)
PROGRESS NOTE    Gary Hudson  QIW:[id number] DOB: [birth year] DOA: 01/23/2019 PCP: Patient, No Pcp Per   Brief Narrative:  53 year old male with a past medical history of tobacco use, diabetes mellitus type 2, essential hypertension, hypothyroidism came to the hospital with exertional shortness of breath with productive cough.  COVID was negative.  Chest x-ray showed concerns for a loculated effusion on the left side.CT scan showed worsening of cavitary mass with left-sided pleural effusion.  Cardiothoracic and pulmonary team were consulted pulmonary team recommended to check for TB with QuantiFERON.  Antibiotics were changed to Zosyn.  Cardiothoracic scheduled for decortication and VATS procedure.  Initially was also in DKA transitioned to subcutaneous insulin and then transition to subcutaneous insulin.  He underwent VATS/bronchoscopy on 6/4 when significant amount of purulent drainage was removed and chest tube was placed.  01/28/2019: Cardiothoracic input is appreciated.  As per cardiothoracic surgery note, 1 of the chest tubes will be removed today.  We will continue antibiotics for now.  Cultures reviewed.  Patient seen.  No new complaints.  No fever or chills.  01/29/2019: Patient seen alongside patient's nurse.  Cardiothoracic team input is highly appreciated.  Posterior chest tube was removed yesterday.  Cardiothoracic team is considering removing left chest tube fairly soon.  Cultures have not grown any organisms.  Patient is still on antibiotics.  Leukocytosis has resolved.  No new complaints today.    Assessment & Plan:   Principal Problem:   Sepsis ([place]) Active Problems:   Loculated pleural effusion   Uncontrolled type 2 diabetes mellitus with hyperglycemia (HCC)   Essential hypertension   Status post thoracotomy  Sepsis secondary to left-sided loculated effusion with cavitary pneumonia, present prior to admission Status post VATS/thoracoscopy with drainage of empyema and  decortication -Continue IV vancomycin and Zosyn day 4.  Pulmonary input appreciated.follow-up culture data -CTA of the chest reviewed.  Chest tube management per CT surgery team. - CT surgery team following. -Initial goal QuantiFERON indeterminate.  All of AFB results are pending. Airborne precaution discontinued by CT surgery this morning .  Awaiting pulmonary input. -Foley catheter is to be discontinued.  Arterial line discontinued 6/5. 01/28/2019: Cardiothoracic input is appreciated.  As per cardiothoracic surgery note, 1 of the chest tubes will be removed today.  We will continue antibiotics for now (IV vancomycin and Zosyn).  Cultures reviewed.  Patient seen.  No new complaints.  No fever or chills.  Further management depend on hospital course. 01/29/2019: Posterior chest tube was removed yesterday.  The thoracic team is considering removing remaining chest tube.  Leukocytosis has resolved.  No fever or chills.  Patient is improving.  Cultures have not grown any organisms to date.  Will continue antibiotics for now.   Diabetic ketoacidosis, resolved Uncontrolled diabetes mellitus type 2 - Diabetic coordinator following.  Continue current regimen.  Patient does not want to be on metformin at the time of discharge.  Will have to make arrangements for a basic lar at the time of discharge, follow-up their note from 6/5. - Closely monitor. -Hemoglobin A1c 11.4 01/28/2019: Currently, patient is on subcutaneous Lantus insulin 16 units daily, as well as, sliding scale insulin coverage.  Continue to monitor blood sugar closely. 01/29/2019: Blood sugar control has improved significantly.  Continue current management.  Essential hypertension - IV as needed hydralazine.  Resume home p.o. meds when appropriate Blood pressure control is optimized.  Patient's blood pressure is 128/83 mmHg. 01/29/2019: Losartan 25 Mg p.o. once daily started.  Patient was on losartan/HCTZ 50/12.5 once daily prior to admission.   Continue to monitor blood pressure closely.  Chronic normocytic anemia -Hemoglobin stable.  Continue to monitor  Hypothyroidism -Continue Synthroid.  TSH within normal limits  Tobacco use - Claims quit several weeks ago  Hyperlipidemia - Continue statin   DVT prophylaxis: SCDs Code Status: Full code Family Communication: None at bedside Disposition Plan: To be determined  Consultants:   Critical care  Cardiothoracic surgery  Procedures:   VATS/decortication/drainage of empyema/thoracoscopy on 6/4  Antimicrobials:   Zosyn  IV vancomycin.     Subjective: No fever or chills. No productive cough.  Objective: Vitals:   01/29/19 0323 01/29/19 0325 01/29/19 0734 01/29/19 0913  BP:  124/77 134/86   Pulse: 82 79 86   Resp: (!) 21 (!) 26 16   Temp:  97.7 F (36.5 C) 97.6 F (36.4 C)   TempSrc:  Oral Oral   SpO2:  96% 97% 98%  Weight:      Height:        Intake/Output Summary (Last 24 hours) at 01/29/2019 1037 Last data filed at 01/29/2019 0734 Gross per 24 hour  Intake 2229.05 ml  Output 3525 ml  Net -1295.95 ml   Filed Weights   01/24/19 0842 01/25/19 0636 01/26/19 0555  Weight: 82.6 kg 82.7 kg 81.2 kg    Examination: Constitutional: NAD, calm, comfortable Eyes: PERRL, lids and conjunctivae normal ENMT: Mucous membranes are moist. Posterior pharynx clear of any exudate or lesions.Normal dentition.  Neck: normal, supple, no masses, no thyromegaly Respiratory: Left chest wall Chest tube.  Improved air entry.   Cardiovascular: S1-S2.   Abdomen: no tenderness, no masses palpated. No hepatosplenomegaly. Bowel sounds positive.  Musculoskeletal: No leg edema. Neurologic: Awake, alert.  Patient moves all extremities.  Data Reviewed:   CBC: Recent Labs  Lab 01/23/19 2351 01/24/19 0504 01/25/19 0403 01/26/19 0533 01/26/19 4270 01/26/19 1002 01/27/19 0348 01/28/19 0233 01/29/19 0244  WBC 23.8* 19.0* 14.3* 12.9*  --   --  17.3* 11.5* 10.1   NEUTROABS 19.0* 14.7*  --   --   --   --   --   --   --   HGB 10.7* 9.4* 9.4* 9.4* 9.2* 10.2* 10.1* 9.3* 9.5*  HCT 33.4* 29.4* 29.0* 29.1* 27.0* 30.0* 31.1* 29.3* 29.9*  MCV 85.4 86.0 85.3 84.8  --   --  85.4 85.9 86.4  PLT 734* 584* 595* 639*  --   --  759* 731* 623*   Basic Metabolic Panel: Recent Labs  Lab 01/25/19 0403 01/26/19 0533 01/26/19 0938 01/26/19 1002 01/27/19 0348 01/28/19 0233 01/29/19 0244  NA 130* 129* 133* 131* 130* 135 138  K 3.7 3.2* 3.3* 3.6 3.9 3.2* 3.6  CL 94* 92*  --   --  93* 98 100  CO2 22 24  --   --  25 28 30   GLUCOSE 214* 210*  --   --  237* 227* 132*  BUN 13 5*  --   --  11 10 <5*  CREATININE 0.80 0.73  --   --  0.62 0.55* 0.59*  CALCIUM 8.1* 8.1*  --   --  8.5* 8.2* 8.5*  MG 1.9 1.8  --   --  2.1 1.9 1.8   GFR: Estimated Creatinine Clearance: 112.4 mL/min (A) (by C-G formula based on SCr of 0.59 mg/dL (L)). Liver Function Tests: Recent Labs  Lab 01/23/19 2351 01/24/19 0504 01/28/19 0233  AST 21 19 25   ALT 21 18  24  ALKPHOS 231* 176* 156*  BILITOT 1.1 1.1 0.2*  PROT 7.6 6.2* 5.4*  ALBUMIN 2.3* 1.8* 1.7*   No results for input(s): LIPASE, AMYLASE in the last 168 hours. No results for input(s): AMMONIA in the last 168 hours. Coagulation Profile: Recent Labs  Lab 01/23/19 2351 01/24/19 2107  INR 1.2 1.3*   Cardiac Enzymes: Recent Labs  Lab 01/24/19 0857  TROPONINI <0.03   BNP (last 3 results) No results for input(s): PROBNP in the last 8760 hours. HbA1C: No results for input(s): HGBA1C in the last 72 hours. CBG: Recent Labs  Lab 01/28/19 1123 01/28/19 1631 01/28/19 2015 01/29/19 0000 01/29/19 0327  GLUCAP 265* 241* 206* 126* 116*   Lipid Profile: No results for input(s): CHOL, HDL, LDLCALC, TRIG, CHOLHDL, LDLDIRECT in the last 72 hours. Thyroid Function Tests: No results for input(s): TSH, T4TOTAL, FREET4, T3FREE, THYROIDAB in the last 72 hours. Anemia Panel: No results for input(s): VITAMINB12, FOLATE, FERRITIN,  TIBC, IRON, RETICCTPCT in the last 72 hours. Sepsis Labs: Recent Labs  Lab 01/23/19 2352 01/24/19 0146 01/24/19 0504 01/24/19 0857  PROCALCITON  --   --  3.78  --   LATICACIDVEN 2.5* 1.4 1.2 1.5    Recent Results (from the past 240 hour(s))  Culture, blood (Routine x 2)     Status: None   Collection Time: 01/23/19 11:30 PM  Result Value Ref Range Status   Specimen Description BLOOD LEFT ARM  Final   Special Requests   Final    BOTTLES DRAWN AEROBIC AND ANAEROBIC Blood Culture results may not be optimal due to an excessive volume of blood received in culture bottles   Culture   Final    NO GROWTH 5 DAYS Performed at [facility], [place] [address], [place], [place] [address]    Report Status 01/29/2019 FINAL  Final  Culture, blood (Routine x 2)     Status: None   Collection Time: 01/23/19 11:48 PM  Result Value Ref Range Status   Specimen Description BLOOD RIGHT ARM  Final   Special Requests   Final    BOTTLES DRAWN AEROBIC ONLY Blood Culture results may not be optimal due to an excessive volume of blood received in culture bottles   Culture   Final    NO GROWTH 5 DAYS Performed at [facility], [place] [address] [place],  [address]    Report Status 01/29/2019 FINAL  Final  SARS Coronavirus 2 (CEPHEID- Performed in [place] hospital lab), Hosp Order     Status: None   Collection Time: 01/24/19  1:48 AM  Result Value Ref Range Status   SARS Coronavirus 2 NEGATIVE NEGATIVE Final    Comment: (NOTE) If result is NEGATIVE SARS-CoV-2 target nucleic acids are NOT DETECTED. The SARS-CoV-2 RNA is generally detectable in upper and lower  respiratory specimens during the acute phase of infection. The lowest  concentration of SARS-CoV-2 viral copies this assay can detect is 250  copies / mL. A negative result does not preclude SARS-CoV-2 infection  and should not be used as the sole basis for treatment or other  patient management decisions.  A negative result may  occur with  improper specimen collection / handling, submission of specimen other  than nasopharyngeal swab, presence of viral mutation(s) within the  areas targeted by this assay, and inadequate number of viral copies  (<250 copies / mL). A negative result must be combined with clinical  observations, patient history, and epidemiological information. If  result is POSITIVE SARS-CoV-2 target nucleic acids are DETECTED. The SARS-CoV-2 RNA is generally detectable in upper and lower  respiratory specimens dur ing the acute phase of infection.  Positive  results are indicative of active infection with SARS-CoV-2.  Clinical  correlation with patient history and other diagnostic information is  necessary to determine patient infection status.  Positive results do  not rule out bacterial infection or co-infection with other viruses. If result is PRESUMPTIVE POSTIVE SARS-CoV-2 nucleic acids MAY BE PRESENT.   A presumptive positive result was obtained on the submitted specimen  and confirmed on repeat testing.  While 2019 novel coronavirus  (SARS-CoV-2) nucleic acids may be present in the submitted sample  additional confirmatory testing may be necessary for epidemiological  and / or clinical management purposes  to differentiate between  SARS-CoV-2 and other Sarbecovirus currently known to infect humans.  If clinically indicated additional testing with an alternate test  methodology [phone]) is advised. The SARS-CoV-2 RNA is generally  detectable in upper and lower respiratory sp ecimens during the acute  phase of infection. The expected result is Negative. Fact Sheet for Patients:  StrictlyIdeas.no Fact Sheet for Healthcare Providers: BankingDealers.co.za This test is not yet approved or cleared by the [place] FDA and has been authorized for detection and/or diagnosis of SARS-CoV-2 by FDA under an Emergency Use Authorization (EUA).  This  EUA will remain in effect (meaning this test can be used) for the duration of the COVID-19 declaration under Section 564(b)(1) of the Act, 21 U.S.C. section 360bbb-3(b)(1), unless the authorization is terminated or revoked sooner. Performed at [facility], [place] [address] [place], [place] [address]   Surgical pcr screen     Status: None   Collection Time: 01/24/19  6:04 PM  Result Value Ref Range Status   MRSA, PCR NEGATIVE NEGATIVE Final   Staphylococcus aureus NEGATIVE NEGATIVE Final    Comment: (NOTE) The Xpert SA Assay (FDA approved for NASAL specimens in patients 73 years of age and older), is one component of a comprehensive surveillance program. It is not intended to diagnose infection nor to guide or monitor treatment. Performed at [facility], [place] [address] [place], [place] [address]   Anaerobic culture     Status: None (Preliminary result)   Collection Time: 01/26/19  9:37 AM  Result Value Ref Range Status   Specimen Description PLEURAL LEFT  Final   Special Requests   Final    PATIENT ON FOLLOWING ANCEF ZOSYN Performed at [facility], [place] [address], [place], [place] [address]    Culture   Final    NO ANAEROBES ISOLATED; CULTURE IN PROGRESS FOR 5 DAYS   Report Status PENDING  Incomplete  Body fluid culture     Status: None   Collection Time: 01/26/19  9:37 AM  Result Value Ref Range Status   Specimen Description PLEURAL LEFT  Final   Special Requests PATIENT ON FOLLOWING ANCEF ZOSYN  Final   Gram Stain   Final    ABUNDANT WBC PRESENT,BOTH PMN AND MONONUCLEAR NO ORGANISMS SEEN    Culture   Final    NO GROWTH 3 DAYS Performed at [facility], [place] [address], [place], [place] [address]    Report Status 01/29/2019 FINAL  Final  Acid Fast Smear (AFB)     Status: None   Collection Time: 01/26/19  9:37 AM  Result Value Ref Range Status   AFB Specimen Processing Concentration  Final  Acid Fast Smear Negative  Final    Comment: (NOTE)  Performed At: [facility] [place], [place] [id number] Rush Farmer MD [id number]    Source (AFB) PLEURAL  Final    Comment: LEFT Performed at [facility], [place] [address], [place], [place] [address]   Anaerobic culture     Status: None (Preliminary result)   Collection Time: 01/26/19  9:41 AM  Result Value Ref Range Status   Specimen Description PLEURAL LEFT SPEC B ON SWABS  Final   Special Requests   Final    PATIENT ON FOLLOWING ANCEF ZOSYN Performed at [facility], [place] [address] [place], [place] [address]    Culture   Final    NO ANAEROBES ISOLATED; CULTURE IN PROGRESS FOR 5 DAYS   Report Status PENDING  Incomplete  Body fluid culture     Status: None   Collection Time: 01/26/19  9:41 AM  Result Value Ref Range Status   Specimen Description PLEURAL LEFT SPEC B ON SWABS  Final   Special Requests PATIENT ON FOLLOWING ANCEF ZOSYN  Final   Gram Stain NO WBC SEEN NO ORGANISMS SEEN   Final   Culture   Final    NO GROWTH 3 DAYS Performed at [facility], [place] [address] [place], [place] [address]    Report Status 01/29/2019 FINAL  Final  Aerobic/Anaerobic Culture (surgical/deep wound)     Status: None (Preliminary result)   Collection Time: 01/26/19  9:50 AM  Result Value Ref Range Status   Specimen Description TISSUE LEFT PLEURAL  Final   Special Requests PATIENT ON FOLLOWING ANCEF ZOSYN  Final   Gram Stain   Final    RARE WBC PRESENT, PREDOMINANTLY PMN NO ORGANISMS SEEN    Culture   Final    NO GROWTH 3 DAYS NO ANAEROBES ISOLATED; CULTURE IN PROGRESS FOR 5 DAYS Performed at [facility], [place] [address], [place], [place] [address]    Report Status PENDING  Incomplete  Acid Fast Smear (AFB)     Status: None   Collection Time: 01/26/19  9:50 AM  Result Value Ref Range Status   AFB Specimen Processing Comment  Final    Comment: Tissue Grinding and Digestion/Decontamination   Acid Fast Smear Negative  Final     Comment: (NOTE) Performed At: [facility] [place], [place] [id number] Rush Farmer MD [id number]    Source (AFB) TISSUE  Final    Comment: LEFT LUNG Performed at [facility], [place] [address], [place], [place] [address]          Radiology Studies: Dg Chest Port 1 View  Result Date: 01/29/2019 CLINICAL DATA:  History of pneumothorax. EXAM: PORTABLE CHEST 1 VIEW COMPARISON:  Chest radiograph 01/28/2019 FINDINGS: Monitoring leads overlie the patient. Stable cardiac and mediastinal contours. Left chest tube stable in position. Similar-appearing left mid and lower lung consolidation. Persistent small left pleural fluid. IMPRESSION: Stable position left chest tube with similar left lower lung opacity and small left effusion. Electronically Signed   By: Lovey Newcomer M.D.   On: 01/29/2019 08:20   Dg Chest Port 1 View  Result Date: 01/28/2019 CLINICAL DATA:  Patient with empyema. EXAM: PORTABLE CHEST 1 VIEW COMPARISON:  Chest radiograph 01/27/2019 FINDINGS: Monitoring leads overlie the patient. Stable position left chest tube. Increasing opacities within the left lower hemithorax. Mild increasing pleural fluid left lower hemithorax. No pneumothorax. IMPRESSION: Left chest tube remains  in place. Mild increase in opacity within the left lung base. Suggestion of mild increased left pleural fluid. Electronically Signed   By: Lovey Newcomer M.D.   On: 01/28/2019 05:55        Scheduled Meds: . acetaminophen  1,000 mg Oral Q6H   Or  . acetaminophen (TYLENOL) oral liquid 160 mg/5 mL  1,000 mg Oral Q6H  . albuterol  2 puff Inhalation Q6H  . atorvastatin  20 mg Oral q1800  . bisacodyl  10 mg Oral Daily  . Chlorhexidine Gluconate Cloth  6 each Topical Daily  . enoxaparin (LOVENOX) injection  40 mg Subcutaneous Q24H  . feeding supplement (ENSURE ENLIVE)  237 mL Oral BID BM  . insulin aspart  0-24 Units Subcutaneous Q4H  . insulin glargine  16 Units Subcutaneous Daily   . levothyroxine  25 mcg Oral Q0600  . losartan  25 mg Oral Daily  . mouth rinse  15 mL Mouth Rinse BID  . senna-docusate  1 tablet Oral QHS  . sodium chloride flush  3 mL Intravenous Once   Continuous Infusions: . piperacillin-tazobactam (ZOSYN)  IV 3.375 g (01/29/19 0921)  . potassium chloride    . vancomycin 250 mL/hr at 01/29/19 0400     LOS: 5 days   Time spent= 35 mins    Bonnell Public, MD [facility]  If 7PM-7AM, please contact night-coverage [email or web address] 01/29/2019, 10:37 AM

## 2019-01-29 NOTE — Discharge Instructions (Signed)
Thoracotomy, Care After This sheet gives you information about how to care for yourself after your procedure. Your doctor may also give you more specific instructions. If you have problems or questions, contact your doctor. Follow these instructions at home: Preventing lung infection (pneumonia)  Take deep breaths or do breathing exercises as told by your doctor.  Cough often. Coughing is important to clear thick spit (phlegm) and open your lungs. If coughing hurts, hold a pillow against your chest or place both hands flat on top of your cut (splinting) when you cough. This may help with discomfort.  Use an incentive spirometer as told. This is a tool that measures how well you fill your lungs with each breath.  Do lung therapy (pulmonary rehabilitation) as told. Medicines  Take over-the-counter or prescription medicines only as told by your doctor.  If you have pain, take pain-relieving medicine before your pain gets very bad. This will help you breathe and cough more comfortably.  If you were prescribed an antibiotic medicine, take it as told by your doctor. Do not stop taking the antibiotic even if you start to feel better. Activity   Ask your doctor what activities are safe for you.  Do not travel by airplane for 2 weeks after your chest tube is removed, or until your doctor says that this is safe.  Do not lift anything that is heavier than 10 lb (4.5 kg), or the limit that your doctor tells you, until he or she says that it is safe.  Do not drive until your doctor approves. ? Do not drive or use heavy machinery while taking prescription pain medicine. Incision care   Follow instructions from your doctor about how to take care of your cut from surgery (incision). Make sure you: ? Wash your hands with soap and water before you change your bandage (dressing). If you cannot use soap and water, use hand sanitizer. ? Change your bandage as told by your doctor. ? Leave stitches  (sutures), skin glue, or skin tape (adhesive) strips in place. They may need to stay in place for 2 weeks or longer. If tape strips get loose and curl up, you may trim the loose edges. Do not remove tape strips completely unless your doctor says it is okay.  Keep your bandage dry.  Check your cut from surgery every day for signs of infection. Check for: ? More redness, swelling, or pain. ? More fluid or blood. ? Warmth. ? Pus or a bad smell. Bathing  Do not take baths, swim, or use a hot tub until your doctor approves. You may take showers.  After your bandage has been removed, use soap and water to gently wash your cut from surgery. Do not use anything else to clean your cut unless your doctor tells you to. Eating and drinking  Eat a healthy diet as told by your doctor. A healthy diet includes: ? Fresh fruits and vegetables. ? Whole grains. ? Low-fat (lean) proteins.  Drink enough fluid to keep your pee (urine) clear or pale yellow. General instructions  To prevent or treat trouble pooping (constipation) while you are taking prescription pain medicine, your doctor may recommend that you: ? Take over-the-counter or prescription medicines. ? Eat foods that are high in fiber. These include fresh fruits and vegetables, whole grains, and beans. ? Limit foods that are high in fat and processed sugars, such as fried and sweet foods.  Do not use any products that contain nicotine or tobacco. These include  cigarettes and e-cigarettes. If you need help quitting, ask your doctor.  Avoid secondhand smoke.  Wear compression stockings as told. These help to prevent blood clots and reduce swelling in your legs.  If you have a chest tube, care for it as told.  Keep all follow-up visits as told by your doctor. This is important. Contact a doctor if:  You have more redness, swelling, or pain around your cut from surgery.  You have more fluid or blood coming from your cut from  surgery.  Your cut from surgery feels warm to the touch.  You have pus or a bad smell coming from your cut from surgery.  You have a fever or chills.  Your heartbeat seems uneven.  You feel sick to your stomach (nauseous).  You throw up (vomit).  You have muscle aches.  You have trouble pooping (having a bowel movement). This may mean that you: ? Poop fewer times in a week than normal. ? Have a hard time pooping. ? Have poop that is dry, hard, or bigger than normal. Get help right away if:  You get a rash.  You feel light-headed.  You feel like you might pass out (faint).  You are short of breath.  You have trouble breathing.  You are confused.  You have trouble talking.  You have problems with your seeing (vision).  You are not able to move.  You lose feeling (have numbness) in your: ? Face. ? Arms. ? Legs.  You pass out.  You have a sudden, bad headache.  You feel weak.  You have chest pain.  You have pain that: ? Is very bad. ? Gets worse, even with medicine. Summary  Take deep breaths, do breathing exercises, and cough often. This helps prevent lung infection (pneumonia).  Do not drive until your doctor approves. Do not travel by airplane for 2 weeks after your chest tube is removed, or until your doctor says that this is safe.  Check your cut from surgery every day for signs of infection.  Eat a healthy diet. This includes fresh fruits and vegetables, whole grains, and low-fat (lean) proteins. This information is not intended to replace advice given to you by your health care provider. Make sure you discuss any questions you have with your health care provider. Document Released: 02/09/2012 Document Revised: 05/04/2016 Document Reviewed: 05/04/2016 Elsevier Interactive Patient Education  [employer].

## 2019-01-29 NOTE — Progress Notes (Addendum)
      [address]       [place] [address]             [phone]       3 Days Post-Op Procedure(s) (LRB): VIDEO BRONCHOSCOPY (N/A) VIDEO ASSISTED THORACOSCOPY (VATS)/DRAINAGE OF EMPYEMA  AND DECORTICATION (Left) TRANSESOPHAGEAL ECHOCARDIOGRAM (TEE) (N/A)  Subjective: Patient states has back pain  Objective: Vital signs in last 24 hours: Temp:  [97.6 F (36.4 C)-98 F (36.7 C)] 97.6 F (36.4 C) (06/07 0734) Pulse Rate:  [79-87] 86 (06/07 0734) Cardiac Rhythm: Normal sinus rhythm (06/07 0734) Resp:  [12-26] 16 (06/07 0734) BP: (123-141)/(72-87) 134/86 (06/07 0734) SpO2:  [96 %-99 %] 97 % (06/07 0734)      Intake/Output from previous day: 06/06 0701 - 06/07 0700 In: 2569.1 [P.O.:1762; IV Piggyback:783.1] Out: 3725 [Urine:3725]   Physical Exam:  Cardiovascular: RRR Pulmonary: Clear to auscultation on right and slighlty diminished left basilar breath sounds Abdomen: Soft, non tender, bowel sounds present. Extremities: No lower extremity edema. Wounds: Clean and dry.  No erythema or signs of infection. Chest Tubes: to water seal, no air leak  Lab Results: CBC: Recent Labs    01/28/19 0233 01/29/19 0244  WBC 11.5* 10.1  HGB 9.3* 9.5*  HCT 29.3* 29.9*  PLT 731* 814*   BMET:  Recent Labs    01/28/19 0233 01/29/19 0244  NA 135 138  K 3.2* 3.6  CL 98 100  CO2 28 30  GLUCOSE 227* 132*  BUN 10 <5*  CREATININE 0.55* 0.59*  CALCIUM 8.2* 8.5*    PT/INR: No results for input(s): LABPROT, INR in the last 72 hours. ABG:  INR: Will add last result for INR, ABG once components are confirmed Will add last 4 CBG results once components are confirmed  Assessment/Plan:  1. CV - History of hypertension. SR in the 80's. Will restart low dose Losartan component of Hyzaar. 2.  Pulmonary - On room air. Posterior chest tube removed yesterday. Remaining chest tube with no recorded output last 24 hours? Chest tube is to water seal and there is no  air leak. CXR this am shows no pneumothorax,  left pleural consolidation.today. Hope to remove remaining chest tube soon. Check CXR in am.Encourage incentive spirometer. 3. ID-on Vancomycin and Zosyn. Culture showed no growth to date. 4. Anemia-H and H this am stable at 9.5 and 29.9 5. Supplement potassium 6. Hypothyroidism-on Levothyroxine 25 mcg daily 7. DM- CBGs 206/126/116. On Insulin. He was on Metformin and Glimepiride prior to surgery. Per primary  Donielle M ZimmermanPA-C 01/29/2019,9:10 AM [phone]  Patient seen and examined, agree with above CXR improved aeration left base Dc chest tube  Remo Lipps C. Roxan Hockey, MD [facility] [phone]

## 2019-01-29 NOTE — Progress Notes (Signed)
Pharmacy Antibiotic Note  Gary Hudson is a 53 y.o. male admitted on 01/23/2019 with empyema.  Pharmacy has been consulted for vancomycin dosing. Pt now s/p VATs continues on vancomycin and zosyn.   Patient continues to be afebrile, wbc 10, will order vancomycin levels. Cultures have been no growth.   Plan: Vancomycin 1500mg  IV q12h Vancomycin peak this afternoon, trough in am  Height: 5\' 11"  (180.3 cm) Weight: 179 lb 0.2 oz (81.2 kg) IBW/kg (Calculated) : 75.3  Temp (24hrs), Avg:97.8 F (36.6 C), Min:97.6 F (36.4 C), Max:98 F (36.7 C)  Recent Labs  Lab 01/23/19 2352 01/24/19 0146 01/24/19 0504 01/24/19 0857  01/25/19 0403 01/26/19 0533 01/27/19 0348 01/28/19 0233 01/29/19 0244  WBC  --   --  19.0*  --   --  14.3* 12.9* 17.3* 11.5* 10.1  CREATININE  --   --  0.85 0.71   < > 0.80 0.73 0.62 0.55* 0.59*  LATICACIDVEN 2.5* 1.4 1.2 1.5  --   --   --   --   --   --    < > = values in this interval not displayed.    Estimated Creatinine Clearance: 112.4 mL/min (A) (by C-G formula based on SCr of 0.59 mg/dL (L)).    No Known Allergies  Erin Hearing PharmD., BCPS Clinical Pharmacist 01/29/2019 10:54 AM

## 2019-01-30 ENCOUNTER — Inpatient Hospital Stay (HOSPITAL_COMMUNITY): Payer: 59

## 2019-01-30 DIAGNOSIS — E039 Hypothyroidism, unspecified: Secondary | ICD-10-CM

## 2019-01-30 DIAGNOSIS — K0889 Other specified disorders of teeth and supporting structures: Secondary | ICD-10-CM

## 2019-01-30 DIAGNOSIS — K089 Disorder of teeth and supporting structures, unspecified: Secondary | ICD-10-CM

## 2019-01-30 DIAGNOSIS — J869 Pyothorax without fistula: Secondary | ICD-10-CM

## 2019-01-30 DIAGNOSIS — E119 Type 2 diabetes mellitus without complications: Secondary | ICD-10-CM

## 2019-01-30 DIAGNOSIS — Z9889 Other specified postprocedural states: Secondary | ICD-10-CM

## 2019-01-30 DIAGNOSIS — Z720 Tobacco use: Secondary | ICD-10-CM

## 2019-01-30 DIAGNOSIS — I10 Essential (primary) hypertension: Secondary | ICD-10-CM

## 2019-01-30 LAB — CBC WITH DIFFERENTIAL/PLATELET
Abs Immature Granulocytes: 0.47 10*3/uL — ABNORMAL HIGH (ref 0.00–0.07)
Basophils Absolute: 0.1 10*3/uL (ref 0.0–0.1)
Basophils Relative: 1 %
Eosinophils Absolute: 0.1 10*3/uL (ref 0.0–0.5)
Eosinophils Relative: 1 %
HCT: 32.2 % — ABNORMAL LOW (ref 39.0–52.0)
Hemoglobin: 10.3 g/dL — ABNORMAL LOW (ref 13.0–17.0)
Immature Granulocytes: 5 %
Lymphocytes Relative: 27 %
Lymphs Abs: 2.7 10*3/uL (ref 0.7–4.0)
MCH: 27.5 pg (ref 26.0–34.0)
MCHC: 32 g/dL (ref 30.0–36.0)
MCV: 86.1 fL (ref 80.0–100.0)
Monocytes Absolute: 1 10*3/uL (ref 0.1–1.0)
Monocytes Relative: 10 %
Neutro Abs: 5.7 10*3/uL (ref 1.7–7.7)
Neutrophils Relative %: 56 %
Platelets: 860 10*3/uL — ABNORMAL HIGH (ref 150–400)
RBC: 3.74 MIL/uL — ABNORMAL LOW (ref 4.22–5.81)
RDW: 14.3 % (ref 11.5–15.5)
WBC: 10 10*3/uL (ref 4.0–10.5)
nRBC: 0.3 % — ABNORMAL HIGH (ref 0.0–0.2)

## 2019-01-30 LAB — RENAL FUNCTION PANEL
Albumin: 2.1 g/dL — ABNORMAL LOW (ref 3.5–5.0)
Anion gap: 7 (ref 5–15)
BUN: <5 mg/dL — ABNORMAL LOW (ref 6–20)
CO2: 27 mmol/L (ref 22–32)
Calcium: 8.9 mg/dL (ref 8.9–10.3)
Chloride: 100 mmol/L (ref 98–111)
Creatinine, Ser: 0.59 mg/dL — ABNORMAL LOW (ref 0.61–1.24)
GFR calc Af Amer: >60 mL/min (ref >60)
GFR calc non Af Amer: >60 mL/min (ref >60)
Glucose, Bld: 160 mg/dL — ABNORMAL HIGH (ref 70–99)
Phosphorus: 4.1 mg/dL (ref 2.5–4.6)
Potassium: 4.3 mmol/L (ref 3.5–5.1)
Sodium: 134 mmol/L — ABNORMAL LOW (ref 135–145)

## 2019-01-30 LAB — GLUCOSE, CAPILLARY
Glucose-Capillary: 192 mg/dL — ABNORMAL HIGH (ref 70–99)
Glucose-Capillary: 148 mg/dL — ABNORMAL HIGH (ref 70–99)
Glucose-Capillary: 292 mg/dL — ABNORMAL HIGH (ref 70–99)
Glucose-Capillary: 160 mg/dL — ABNORMAL HIGH (ref 70–99)
Glucose-Capillary: 247 mg/dL — ABNORMAL HIGH (ref 70–99)

## 2019-01-30 LAB — MAGNESIUM: Magnesium: 2 mg/dL (ref 1.7–2.4)

## 2019-01-30 LAB — ACID FAST SMEAR (AFB, MYCOBACTERIA): Acid Fast Smear: NEGATIVE

## 2019-01-30 LAB — VANCOMYCIN, TROUGH: Vancomycin Tr: 14 ug/mL — ABNORMAL LOW (ref 15–20)

## 2019-01-30 MED ORDER — VANCOMYCIN HCL 10 G IV SOLR
1250.0000 mg | Freq: Two times a day (BID) | INTRAVENOUS | Status: DC
  Administered 2019-01-30 – 2019-01-31 (×3): 1250 mg via INTRAVENOUS
  Filled 2019-01-30 (×4): qty 1250

## 2019-01-30 MED ORDER — OXYCODONE HCL 5 MG PO TABS: 5.0000 mg | ORAL_TABLET | ORAL | 0 refills | Status: DC | PRN

## 2019-01-30 MED ORDER — PIPERACILLIN-TAZOBACTAM 3.375 G IVPB
3.3750 g | Freq: Three times a day (TID) | INTRAVENOUS | Status: DC
  Administered 2019-01-30: 3.375 g via INTRAVENOUS
  Filled 2019-01-30 (×3): qty 50

## 2019-01-30 MED ORDER — INSULIN ASPART 100 UNIT/ML ~~LOC~~ SOLN
0.0000 [IU] | Freq: Three times a day (TID) | SUBCUTANEOUS | Status: DC
  Administered 2019-01-30: 2 [IU] via SUBCUTANEOUS
  Administered 2019-01-30: 21:00:00 8 [IU] via SUBCUTANEOUS
  Administered 2019-01-30: 12 [IU] via SUBCUTANEOUS
  Administered 2019-01-31: 09:00:00 4 [IU] via SUBCUTANEOUS

## 2019-01-30 MED ORDER — PHENYLEPHRINE HCL-NACL 10-0.9 MG/250ML-% IV SOLN
INTRAVENOUS | Status: AC
  Filled 2019-01-30: qty 500

## 2019-01-30 NOTE — Consult Note (Signed)
[facility] for Infectious Disease    Date of Admission:  01/23/2019     Total days of antibiotics 8  Day 5 vancomycin + zosyn                 Reason for Consult: Empyema    Referring Provider: [place]  Primary Care Provider: Patient, No Pcp Per   Assessment: Gary Hudson is a 53 y.o. male with a history of progressively worsening cough x 4 weeks with subsequent empyema formation noted on CT with cavitary effect. He is now POD4 following VATS and is feeling much better. He is anxious to go home. Would continue treatment with PO Augmentin BID x 14 days to to cover typical oral pathogens including anaerobes given his poor dentition as the likely source of infection here. His nasal PCR is negative however given negative intra op cultures will keep MRSA coverage with Doxycycline x 14 more days.   Plan: 1. Would treat 14 days doxycycline + augmentin at discharge.   2. Will ask pharmacy team to bring antibiotics to bedside prior to D/C   Principal Problem:   Empyema lung ([place]) Active Problems:   Sepsis ([place])   Status post thoracotomy   Uncontrolled type 2 diabetes mellitus with hyperglycemia ([place])   Essential hypertension   . acetaminophen  1,000 mg Oral Q6H   Or  . acetaminophen (TYLENOL) oral liquid 160 mg/5 mL  1,000 mg Oral Q6H  . albuterol  2 puff Inhalation Q6H  . atorvastatin  20 mg Oral q1800  . bisacodyl  10 mg Oral Daily  . Chlorhexidine Gluconate Cloth  6 each Topical Daily  . enoxaparin (LOVENOX) injection  40 mg Subcutaneous Q24H  . feeding supplement (ENSURE ENLIVE)  237 mL Oral BID BM  . insulin aspart  0-24 Units Subcutaneous TID PC & HS  . insulin glargine  16 Units Subcutaneous Daily  . levothyroxine  25 mcg Oral Q0600  . losartan  25 mg Oral Daily  . senna-docusate  1 tablet Oral QHS  . sodium chloride flush  3 mL Intravenous Once    HPI: Gary Hudson is a 53 y.o. male admitted to [facility] from home on 01/24/19 for evaluation for  shortness of breath and cough.  PMHx includes tobacco use, type 2 diabetes mellitus, HTN, hypothyroidism. Heavy smoker, ETOH use, poor dentition.   He has been feeling ill for about 1 month prior to admission with ongoing cough and development of chest pain. He was given 2 rounds of antibiotics by his primary care provider with the last one completed 2 weeks prior to admission. In the ER  His chest xray revealed a loculated effusion on the left. He was found to have fever and tachycardia as well as hyperglycemia to 432 with anion gap 16, leukocytosis 23.8K and lactate 2.5. COVID-19 was negative. CT scan with cavitary effect. TB ruled out with negative Quantiferon GOLD assay and AFB stains negative. Started on  Piperacillin-tazobactam  Prior to surgery for treatment of CAP. He underwent VATS with Dr. [facility] Gum recently to drain empyema and decorticate the left lower lobe with resection of necrotic lung tissue. He is now POD 4 and has had all chest tubes removed. He is on room air and ambulating in the hallway well. Feels stronger all the time. Still with some soreness around the incision and residual cough.   Intraoperative cultures from fluid and pleura are so far negative for conventional  cultures with negative AFB and fungal stains with final report pending. MRSA PCR negative.   Review of Systems: Review of Systems  All other systems reviewed and are negative.   Past Medical History:  Diagnosis Date  . Diabetes mellitus without complication ([place])     Social History   Tobacco Use  . Smoking status: Former Research scientist (life sciences)  . Smokeless tobacco: Never Used  Substance Use Topics  . Alcohol use: Never    Frequency: Never  . Drug use: Never    Family History  Problem Relation Age of Onset  . CAD Father   . Diabetes Mellitus II Neg Hx    No Known Allergies  OBJECTIVE: Blood pressure 96/69, pulse 82, temperature 97.6 F (36.4 C), temperature source Oral, resp. rate 20, height 5\' 11"  (1.803 m),  weight 81.2 kg, SpO2 97 %.  Physical Exam Constitutional:      Comments: Sitting up in the bed comfortable and in no distress.   HENT:     Mouth/Throat:     Mouth: Mucous membranes are moist.     Pharynx: Oropharynx is clear.     Comments: Poor dentition  Cardiovascular:     Rate and Rhythm: Normal rate and regular rhythm.     Heart sounds: No murmur.  Pulmonary:     Effort: Pulmonary effort is normal. No tachypnea.     Breath sounds: Examination of the left-lower field reveals decreased breath sounds. Decreased breath sounds present. No wheezing or rhonchi.  Abdominal:     Palpations: Abdomen is soft.  Skin:    General: Skin is warm and dry.     Capillary Refill: Capillary refill takes less than 2 seconds.     Comments: L lateral thoracotomy incision with clean and dry bandage.   Neurological:     General: No focal deficit present.     Mental Status: He is alert.     Lab Results Lab Results  Component Value Date   WBC 10.0 01/30/2019   HGB 10.3 (L) 01/30/2019   HCT 32.2 (L) 01/30/2019   MCV 86.1 01/30/2019   PLT 860 (H) 01/30/2019    Lab Results  Component Value Date   CREATININE 0.59 (L) 01/30/2019   BUN <5 (L) 01/30/2019   NA 134 (L) 01/30/2019   K 4.3 01/30/2019   CL 100 01/30/2019   CO2 27 01/30/2019    Lab Results  Component Value Date   ALT 24 01/28/2019   AST 25 01/28/2019   ALKPHOS 156 (H) 01/28/2019   BILITOT 0.2 (L) 01/28/2019     Microbiology: Recent Results (from the past 240 hour(s))  Culture, blood (Routine x 2)     Status: None   Collection Time: 01/23/19 11:30 PM  Result Value Ref Range Status   Specimen Description BLOOD LEFT ARM  Final   Special Requests   Final    BOTTLES DRAWN AEROBIC AND ANAEROBIC Blood Culture results may not be optimal due to an excessive volume of blood received in culture bottles   Culture   Final    NO GROWTH 5 DAYS Performed at [facility], [place] [address], [place], [place] [address]    Report Status  01/29/2019 FINAL  Final  Culture, blood (Routine x 2)     Status: None   Collection Time: 01/23/19 11:48 PM  Result Value Ref Range Status   Specimen Description BLOOD RIGHT ARM  Final   Special Requests   Final    BOTTLES DRAWN AEROBIC ONLY  Blood Culture results may not be optimal due to an excessive volume of blood received in culture bottles   Culture   Final    NO GROWTH 5 DAYS Performed at [place] [address], [place], [place] [address]    Report Status 01/29/2019 FINAL  Final  SARS Coronavirus 2 (CEPHEID- Performed in [place] hospital lab), Hosp Order     Status: None   Collection Time: 01/24/19  1:48 AM  Result Value Ref Range Status   SARS Coronavirus 2 NEGATIVE NEGATIVE Final    Comment: (NOTE) If result is NEGATIVE SARS-CoV-2 target nucleic acids are NOT DETECTED. The SARS-CoV-2 RNA is generally detectable in upper and lower  respiratory specimens during the acute phase of infection. The lowest  concentration of SARS-CoV-2 viral copies this assay can detect is 250  copies / mL. A negative result does not preclude SARS-CoV-2 infection  and should not be used as the sole basis for treatment or other  patient management decisions.  A negative result may occur with  improper specimen collection / handling, submission of specimen other  than nasopharyngeal swab, presence of viral mutation(s) within the  areas targeted by this assay, and inadequate number of viral copies  (<250 copies / mL). A negative result must be combined with clinical  observations, patient history, and epidemiological information. If result is POSITIVE SARS-CoV-2 target nucleic acids are DETECTED. The SARS-CoV-2 RNA is generally detectable in upper and lower  respiratory specimens dur ing the acute phase of infection.  Positive  results are indicative of active infection with SARS-CoV-2.  Clinical  correlation with patient history and other diagnostic information is  necessary to  determine patient infection status.  Positive results do  not rule out bacterial infection or co-infection with other viruses. If result is PRESUMPTIVE POSTIVE SARS-CoV-2 nucleic acids MAY BE PRESENT.   A presumptive positive result was obtained on the submitted specimen  and confirmed on repeat testing.  While 2019 novel coronavirus  (SARS-CoV-2) nucleic acids may be present in the submitted sample  additional confirmatory testing may be necessary for epidemiological  and / or clinical management purposes  to differentiate between  SARS-CoV-2 and other Sarbecovirus currently known to infect humans.  If clinically indicated additional testing with an alternate test  methodology [phone]) is advised. The SARS-CoV-2 RNA is generally  detectable in upper and lower respiratory sp ecimens during the acute  phase of infection. The expected result is Negative. Fact Sheet for Patients:  StrictlyIdeas.no Fact Sheet for Healthcare Providers: BankingDealers.co.za This test is not yet approved or cleared by the [place] FDA and has been authorized for detection and/or diagnosis of SARS-CoV-2 by FDA under an Emergency Use Authorization (EUA).  This EUA will remain in effect (meaning this test can be used) for the duration of the COVID-19 declaration under Section 564(b)(1) of the Act, 21 U.S.C. section 360bbb-3(b)(1), unless the authorization is terminated or revoked sooner. Performed at [facility], [place] [address], [place], [place] [address]   Surgical pcr screen     Status: None   Collection Time: 01/24/19  6:04 PM  Result Value Ref Range Status   MRSA, PCR NEGATIVE NEGATIVE Final   Staphylococcus aureus NEGATIVE NEGATIVE Final    Comment: (NOTE) The Xpert SA Assay (FDA approved for NASAL specimens in patients 63 years of age and older), is one component of a comprehensive surveillance program. It is not intended to diagnose  infection nor to guide or monitor  treatment. Performed at [facility], [place] [address] [place], [place] [address]   Anaerobic culture     Status: None (Preliminary result)   Collection Time: 01/26/19  9:37 AM  Result Value Ref Range Status   Specimen Description PLEURAL LEFT  Final   Special Requests   Final    PATIENT ON FOLLOWING ANCEF ZOSYN Performed at [facility], [place] [address] [place], [place] [address]    Culture   Final    NO ANAEROBES ISOLATED; CULTURE IN PROGRESS FOR 5 DAYS   Report Status PENDING  Incomplete  Body fluid culture     Status: None   Collection Time: 01/26/19  9:37 AM  Result Value Ref Range Status   Specimen Description PLEURAL LEFT  Final   Special Requests PATIENT ON FOLLOWING ANCEF ZOSYN  Final   Gram Stain   Final    ABUNDANT WBC PRESENT,BOTH PMN AND MONONUCLEAR NO ORGANISMS SEEN    Culture   Final    NO GROWTH 3 DAYS Performed at [facility], [place] [address], [place], [place] [address]    Report Status 01/29/2019 FINAL  Final  Fungus Culture With Stain     Status: None (Preliminary result)   Collection Time: 01/26/19  9:37 AM  Result Value Ref Range Status   Fungus Stain Final report  Final    Comment: (NOTE) Performed At: [facility] [place], [place] [id number] Rush Farmer MD [id number]    Fungus (Mycology) Culture PENDING  Incomplete   Fungal Source PLEURAL  Final    Comment: LEFT Performed at [facility], [place] [address] [place], [place] [address]   Acid Fast Smear (AFB)     Status: None   Collection Time: 01/26/19  9:37 AM  Result Value Ref Range Status   AFB Specimen Processing Concentration  Final   Acid Fast Smear Negative  Final    Comment: (NOTE) Performed At: [facility] [place], [place] [id number] Rush Farmer MD [id number]    Source (AFB) PLEURAL  Final    Comment: LEFT Performed at [facility], [place] [address],  [place],  [address]   Fungus Culture Result     Status: None   Collection Time: 01/26/19  9:37 AM  Result Value Ref Range Status   Result 1 Comment  Final    Comment: (NOTE) KOH/Calcofluor preparation:  no fungus observed. Performed At: [facility] [place], [place] [id number] Rush Farmer MD [id number]   Anaerobic culture     Status: None (Preliminary result)   Collection Time: 01/26/19  9:41 AM  Result Value Ref Range Status   Specimen Description PLEURAL LEFT SPEC B ON SWABS  Final   Special Requests   Final    PATIENT ON FOLLOWING ANCEF ZOSYN Performed at [facility], [address], [place],  [address]    Culture   Final    NO ANAEROBES ISOLATED; CULTURE IN PROGRESS FOR 5 DAYS   Report Status PENDING  Incomplete  Body fluid culture     Status: None   Collection Time: 01/26/19  9:41 AM  Result Value Ref Range Status   Specimen Description PLEURAL LEFT SPEC B ON SWABS  Final   Special Requests PATIENT ON FOLLOWING ANCEF ZOSYN  Final   Gram Stain NO WBC SEEN NO ORGANISMS SEEN   Final   Culture   Final    NO GROWTH 3  DAYS Performed at [facility], [place] [address], [place], [place] [address]    Report Status 01/29/2019 FINAL  Final  Fungus Culture With Stain     Status: None (Preliminary result)   Collection Time: 01/26/19  9:41 AM  Result Value Ref Range Status   Fungus Stain Final report  Final    Comment: (NOTE) Performed At: [facility] [place], [place] [id number] Rush Farmer MD [id number]    Fungus (Mycology) Culture PENDING  Incomplete   Fungal Source PLEURAL  Final    Comment: LEFT SPEC B ON SWABS Performed at [facility], [place] [address], [place], [place] [address]   Fungus Culture Result     Status: None   Collection Time: 01/26/19  9:41 AM  Result Value Ref Range Status   Result 1 Comment  Final    Comment: (NOTE) KOH/Calcofluor preparation:  no fungus observed.  Performed At: [facility] [place], [place] [id number] Rush Farmer MD [id number]   Fungus Culture With Stain     Status: None (Preliminary result)   Collection Time: 01/26/19  9:50 AM  Result Value Ref Range Status   Fungus Stain Final report  Final    Comment: (NOTE) Performed At: [facility] [place], [place] [id number] Rush Farmer MD [id number]    Fungus (Mycology) Culture PENDING  Incomplete   Fungal Source TISSUE  Final    Comment: LEFT LUNG Performed at [facility], [place] [address], [place], [place] [address]   Aerobic/Anaerobic Culture (surgical/deep wound)     Status: None (Preliminary result)   Collection Time: 01/26/19  9:50 AM  Result Value Ref Range Status   Specimen Description TISSUE LEFT PLEURAL  Final   Special Requests PATIENT ON FOLLOWING ANCEF ZOSYN  Final   Gram Stain   Final    RARE WBC PRESENT, PREDOMINANTLY PMN NO ORGANISMS SEEN    Culture   Final    NO GROWTH 4 DAYS NO ANAEROBES ISOLATED; CULTURE IN PROGRESS FOR 5 DAYS Performed at [facility], [place] [address] [place], [place] [address]    Report Status PENDING  Incomplete  Acid Fast Smear (AFB)     Status: None   Collection Time: 01/26/19  9:50 AM  Result Value Ref Range Status   AFB Specimen Processing Comment  Final    Comment: Tissue Grinding and Digestion/Decontamination   Acid Fast Smear Negative  Final    Comment: (NOTE) Performed At: [facility] [place], [place] [id number] Rush Farmer MD [id number]    Source (AFB) TISSUE  Final    Comment: LEFT LUNG Performed at [facility], [place] [address], [place], [place] [address]   Fungus Culture Result     Status: None   Collection Time: 01/26/19  9:50 AM  Result Value Ref Range Status   Result 1 Comment  Final    Comment: (NOTE) KOH/Calcofluor preparation:  no fungus observed. Performed At: [facility] [address]  [place], [place] [id number] Rush Farmer MD [id number]     Janene Madeira, MSN, NP-C [facility] [place] [facility] Pager: [phone]  01/30/2019 4:21 PM

## 2019-01-30 NOTE — Progress Notes (Signed)
PROGRESS NOTE    Gary Hudson  ASN:[id number] DOB: 10/26/1965 DOA: 01/23/2019 PCP: Patient, No Pcp Per   Brief Narrative:  53 year old male with a past medical history of tobacco use, diabetes mellitus type 2, essential hypertension, hypothyroidism came to the hospital with exertional shortness of breath with productive cough.  COVID was negative.  Chest x-ray showed concerns for a loculated effusion on the left side.CT scan showed worsening of cavitary mass with left-sided pleural effusion.  Cardiothoracic and pulmonary team were consulted pulmonary team recommended to check for TB with QuantiFERON.  Antibiotics were changed to Zosyn.  Cardiothoracic scheduled for decortication and VATS procedure.  Initially was also in DKA transitioned to subcutaneous insulin and then transition to subcutaneous insulin.  He underwent VATS/bronchoscopy on 6/4 when significant amount of purulent drainage was removed and chest tube was placed.  01/30/2019: Chest tubes have been discontinued.  Cultures have not grown any organisms to date.  Will consult infectious disease team to advise on antibiotics regimen on discharge (discussed with Dr. Leonor Liv Commer).  Cardiothoracic input is highly appreciated.    Assessment & Plan:   Principal Problem:   Sepsis ([place]) Active Problems:   Loculated pleural effusion   Uncontrolled type 2 diabetes mellitus with hyperglycemia (HCC)   Essential hypertension   Status post thoracotomy  Sepsis secondary to left-sided loculated effusion with cavitary pneumonia, present prior to admission Status post VATS/thoracoscopy with drainage of empyema and decortication -Continue IV vancomycin and Zosyn day 4.  Pulmonary input appreciated.follow-up culture data -CTA of the chest reviewed.  Chest tube management per CT surgery team. - CT surgery team following. -Initial goal QuantiFERON indeterminate.  All of AFB results are pending. Airborne precaution discontinued by CT surgery this morning  .  Awaiting pulmonary input. -Foley catheter is to be discontinued.  Arterial line discontinued 6/5. 01/30/2019: Cardiothoracic input is appreciated.  Chest tubes have been discontinued.  I have consulted infectious disease team to advise on discharge antibiotics.  Cultures have not grown any organisms to date.  Patient denies any constitutional symptoms.  Patient is currently on IV vancomycin and Zosyn.    Diabetic ketoacidosis, resolved Uncontrolled diabetes mellitus type 2 - Diabetic coordinator following.  Continue current regimen.  Patient does not want to be on metformin at the time of discharge.  Will have to make arrangements for a basic lar at the time of discharge, follow-up their note from 6/5. - Closely monitor. -Hemoglobin A1c 11.4 01/30/2019: Blood sugar control has improved significantly.  Currently, patient is on subcutaneous Lantus insulin 16 units daily, as well as, sliding scale insulin coverage.  Continue to monitor blood sugar closely.  Essential hypertension - IV as needed hydralazine.  Resume home p.o. meds when appropriate Blood pressure control is optimized.  Patient's blood pressure is 128/83 mmHg. 01/30/2019: Losartan 25 Mg p.o. once daily started on 01/29/2019.  Blood pressure is better controlled.  Patient was on losartan/HCTZ 50/12.5 once daily prior to admission.  Continue to monitor blood pressure closely.  Chronic normocytic anemia -Hemoglobin stable.  Continue to monitor  Hypothyroidism -Continue Synthroid.  TSH within normal limits  Tobacco use - Claims quit several weeks ago  Hyperlipidemia - Continue statin   DVT prophylaxis: SCDs Code Status: Full code Family Communication: None at bedside Disposition Plan: To be determined  Consultants:   Critical care  Cardiothoracic surgery  Infectious disease team consulted to advise on discharge antibiotics  Procedures:   VATS/decortication/drainage of empyema/thoracoscopy on 6/4  Antimicrobials:    Zosyn  IV vancomycin.     Subjective: No fever or chills. No productive cough.  Objective: Vitals:   01/30/19 0245 01/30/19 0428 01/30/19 0814 01/30/19 0834  BP:  (!) 135/94 114/76   Pulse: 76 75 88   Resp: (!) 22 13 (!) 21   Temp:  97.9 F (36.6 C) 97.7 F (36.5 C)   TempSrc:  Oral Oral   SpO2:  97% 99% 97%  Weight:      Height:        Intake/Output Summary (Last 24 hours) at 01/30/2019 1314 Last data filed at 01/30/2019 0815 Gross per 24 hour  Intake 462 ml  Output 5000 ml  Net -4538 ml   Filed Weights   01/24/19 0842 01/25/19 0636 01/26/19 0555  Weight: 82.6 kg 82.7 kg 81.2 kg    Examination: Constitutional: NAD, calm, comfortable Eyes: PERRL, lids and conjunctivae normal ENMT: Mucous membranes are moist. Posterior pharynx clear of any exudate or lesions.Normal dentition.  Neck: normal, supple, no masses, no thyromegaly Respiratory: Decreased air entry left lung field posteriorly Cardiovascular: S1-S2.   Abdomen: no tenderness, no masses palpated. No hepatosplenomegaly. Bowel sounds positive.  Musculoskeletal: No leg edema. Neurologic: Awake, alert.  Patient moves all extremities.  Data Reviewed:   CBC: Recent Labs  Lab 01/23/19 2351 01/24/19 0504  01/26/19 0533  01/26/19 1002 01/27/19 0348 01/28/19 0233 01/29/19 0244 01/30/19 0316  WBC 23.8* 19.0*   < > 12.9*  --   --  17.3* 11.5* 10.1 10.0  NEUTROABS 19.0* 14.7*  --   --   --   --   --   --   --  5.7  HGB 10.7* 9.4*   < > 9.4*   < > 10.2* 10.1* 9.3* 9.5* 10.3*  HCT 33.4* 29.4*   < > 29.1*   < > 30.0* 31.1* 29.3* 29.9* 32.2*  MCV 85.4 86.0   < > 84.8  --   --  85.4 85.9 86.4 86.1  PLT 734* 584*   < > 639*  --   --  759* 731* 814* 860*   < > = values in this interval not displayed.   Basic Metabolic Panel: Recent Labs  Lab 01/26/19 0533  01/26/19 1002 01/27/19 0348 01/28/19 0233 01/29/19 0244 01/30/19 0316  NA 129*   < > 131* 130* 135 138 134*  K 3.2*   < > 3.6 3.9 3.2* 3.6 4.3  CL 92*  --    --  93* 98 100 100  CO2 24  --   --  25 28 30 27   GLUCOSE 210*  --   --  237* 227* 132* 160*  BUN 5*  --   --  11 10 <5* <5*  CREATININE 0.73  --   --  0.62 0.55* 0.59* 0.59*  CALCIUM 8.1*  --   --  8.5* 8.2* 8.5* 8.9  MG 1.8  --   --  2.1 1.9 1.8 2.0  PHOS  --   --   --   --   --   --  4.1   < > = values in this interval not displayed.   GFR: Estimated Creatinine Clearance: 112.4 mL/min (A) (by C-G formula based on SCr of 0.59 mg/dL (L)). Liver Function Tests: Recent Labs  Lab 01/23/19 2351 01/24/19 0504 01/28/19 0233 01/30/19 0316  AST 21 19 25   --   ALT 21 18 24   --   ALKPHOS 231* 176* 156*  --   BILITOT 1.1 1.1 0.2*  --  PROT 7.6 6.2* 5.4*  --   ALBUMIN 2.3* 1.8* 1.7* 2.1*   No results for input(s): LIPASE, AMYLASE in the last 168 hours. No results for input(s): AMMONIA in the last 168 hours. Coagulation Profile: Recent Labs  Lab 01/23/19 2351 01/24/19 2107  INR 1.2 1.3*   Cardiac Enzymes: Recent Labs  Lab 01/24/19 0857  TROPONINI <0.03   BNP (last 3 results) No results for input(s): PROBNP in the last 8760 hours. HbA1C: No results for input(s): HGBA1C in the last 72 hours. CBG: Recent Labs  Lab 01/29/19 1558 01/29/19 2032 01/29/19 2342 01/30/19 0622 01/30/19 1141  GLUCAP 162* 160* 192* 148* 292*   Lipid Profile: No results for input(s): CHOL, HDL, LDLCALC, TRIG, CHOLHDL, LDLDIRECT in the last 72 hours. Thyroid Function Tests: No results for input(s): TSH, T4TOTAL, FREET4, T3FREE, THYROIDAB in the last 72 hours. Anemia Panel: No results for input(s): VITAMINB12, FOLATE, FERRITIN, TIBC, IRON, RETICCTPCT in the last 72 hours. Sepsis Labs: Recent Labs  Lab 01/23/19 2352 01/24/19 0146 01/24/19 0504 01/24/19 0857  PROCALCITON  --   --  3.78  --   LATICACIDVEN 2.5* 1.4 1.2 1.5    Recent Results (from the past 240 hour(s))  Culture, blood (Routine x 2)     Status: None   Collection Time: 01/23/19 11:30 PM  Result Value Ref Range Status    Specimen Description BLOOD LEFT ARM  Final   Special Requests   Final    BOTTLES DRAWN AEROBIC AND ANAEROBIC Blood Culture results may not be optimal due to an excessive volume of blood received in culture bottles   Culture   Final    NO GROWTH 5 DAYS Performed at [facility], [place] [address] [place], [place] [address]    Report Status 01/29/2019 FINAL  Final  Culture, blood (Routine x 2)     Status: None   Collection Time: 01/23/19 11:48 PM  Result Value Ref Range Status   Specimen Description BLOOD RIGHT ARM  Final   Special Requests   Final    BOTTLES DRAWN AEROBIC ONLY Blood Culture results may not be optimal due to an excessive volume of blood received in culture bottles   Culture   Final    NO GROWTH 5 DAYS Performed at [facility], [place] [address] [place],  [address]    Report Status 01/29/2019 FINAL  Final  SARS Coronavirus 2 (CEPHEID- Performed in [place] hospital lab), Hosp Order     Status: None   Collection Time: 01/24/19  1:48 AM  Result Value Ref Range Status   SARS Coronavirus 2 NEGATIVE NEGATIVE Final    Comment: (NOTE) If result is NEGATIVE SARS-CoV-2 target nucleic acids are NOT DETECTED. The SARS-CoV-2 RNA is generally detectable in upper and lower  respiratory specimens during the acute phase of infection. The lowest  concentration of SARS-CoV-2 viral copies this assay can detect is 250  copies / mL. A negative result does not preclude SARS-CoV-2 infection  and should not be used as the sole basis for treatment or other  patient management decisions.  A negative result may occur with  improper specimen collection / handling, submission of specimen other  than nasopharyngeal swab, presence of viral mutation(s) within the  areas targeted by this assay, and inadequate number of viral copies  (<250 copies / mL). A negative result must be combined with clinical  observations, patient history, and epidemiological information. If result is  POSITIVE SARS-CoV-2 target nucleic acids are  DETECTED. The SARS-CoV-2 RNA is generally detectable in upper and lower  respiratory specimens dur ing the acute phase of infection.  Positive  results are indicative of active infection with SARS-CoV-2.  Clinical  correlation with patient history and other diagnostic information is  necessary to determine patient infection status.  Positive results do  not rule out bacterial infection or co-infection with other viruses. If result is PRESUMPTIVE POSTIVE SARS-CoV-2 nucleic acids MAY BE PRESENT.   A presumptive positive result was obtained on the submitted specimen  and confirmed on repeat testing.  While 2019 novel coronavirus  (SARS-CoV-2) nucleic acids may be present in the submitted sample  additional confirmatory testing may be necessary for epidemiological  and / or clinical management purposes  to differentiate between  SARS-CoV-2 and other Sarbecovirus currently known to infect humans.  If clinically indicated additional testing with an alternate test  methodology [phone]) is advised. The SARS-CoV-2 RNA is generally  detectable in upper and lower respiratory sp ecimens during the acute  phase of infection. The expected result is Negative. Fact Sheet for Patients:  StrictlyIdeas.no Fact Sheet for Healthcare Providers: BankingDealers.co.za This test is not yet approved or cleared by the [place] FDA and has been authorized for detection and/or diagnosis of SARS-CoV-2 by FDA under an Emergency Use Authorization (EUA).  This EUA will remain in effect (meaning this test can be used) for the duration of the COVID-19 declaration under Section 564(b)(1) of the Act, 21 U.S.C. section 360bbb-3(b)(1), unless the authorization is terminated or revoked sooner. Performed at [facility], [place] [address] [place], [place] [address]   Surgical pcr screen     Status: None   Collection Time:  01/24/19  6:04 PM  Result Value Ref Range Status   MRSA, PCR NEGATIVE NEGATIVE Final   Staphylococcus aureus NEGATIVE NEGATIVE Final    Comment: (NOTE) The Xpert SA Assay (FDA approved for NASAL specimens in patients 94 years of age and older), is one component of a comprehensive surveillance program. It is not intended to diagnose infection nor to guide or monitor treatment. Performed at [facility], [place] [address], [place], [place] [address]   Anaerobic culture     Status: None (Preliminary result)   Collection Time: 01/26/19  9:37 AM  Result Value Ref Range Status   Specimen Description PLEURAL LEFT  Final   Special Requests   Final    PATIENT ON FOLLOWING ANCEF ZOSYN Performed at [facility], [place] [address] [place], [place] [address]    Culture   Final    NO ANAEROBES ISOLATED; CULTURE IN PROGRESS FOR 5 DAYS   Report Status PENDING  Incomplete  Body fluid culture     Status: None   Collection Time: 01/26/19  9:37 AM  Result Value Ref Range Status   Specimen Description PLEURAL LEFT  Final   Special Requests PATIENT ON FOLLOWING ANCEF ZOSYN  Final   Gram Stain   Final    ABUNDANT WBC PRESENT,BOTH PMN AND MONONUCLEAR NO ORGANISMS SEEN    Culture   Final    NO GROWTH 3 DAYS Performed at [facility], [place] [address], [place], [place] [address]    Report Status 01/29/2019 FINAL  Final  Fungus Culture With Stain     Status: None (Preliminary result)   Collection Time: 01/26/19  9:37 AM  Result Value Ref Range Status   Fungus Stain Final report  Final    Comment: (NOTE) Performed At:  [facility] [place], [place] [id number] Rush Farmer MD [id number]    Fungus (Mycology) Culture PENDING  Incomplete   Fungal Source PLEURAL  Final    Comment: LEFT Performed at [facility], [place] [address], [place], [place] [address]   Acid Fast Smear (AFB)     Status: None   Collection Time: 01/26/19  9:37 AM  Result Value Ref  Range Status   AFB Specimen Processing Concentration  Final   Acid Fast Smear Negative  Final    Comment: (NOTE) Performed At: [facility] [place], [place] [id number] Rush Farmer MD [id number]    Source (AFB) PLEURAL  Final    Comment: LEFT Performed at [facility], [place] [address] [place], [place] [address]   Fungus Culture Result     Status: None   Collection Time: 01/26/19  9:37 AM  Result Value Ref Range Status   Result 1 Comment  Final    Comment: (NOTE) KOH/Calcofluor preparation:  no fungus observed. Performed At: [facility] [place], [place] [id number] Rush Farmer MD [id number]   Anaerobic culture     Status: None (Preliminary result)   Collection Time: 01/26/19  9:41 AM  Result Value Ref Range Status   Specimen Description PLEURAL LEFT SPEC B ON SWABS  Final   Special Requests   Final    PATIENT ON FOLLOWING ANCEF ZOSYN Performed at [facility], [address] [place], [place] [address]    Culture   Final    NO ANAEROBES ISOLATED; CULTURE IN PROGRESS FOR 5 DAYS   Report Status PENDING  Incomplete  Body fluid culture     Status: None   Collection Time: 01/26/19  9:41 AM  Result Value Ref Range Status   Specimen Description PLEURAL LEFT SPEC B ON SWABS  Final   Special Requests PATIENT ON FOLLOWING ANCEF ZOSYN  Final   Gram Stain NO WBC SEEN NO ORGANISMS SEEN   Final   Culture   Final    NO GROWTH 3 DAYS Performed at [facility], [place] [address], [place], [place] [address]    Report Status 01/29/2019 FINAL  Final  Fungus Culture With Stain     Status: None (Preliminary result)   Collection Time: 01/26/19  9:41 AM  Result Value Ref Range Status   Fungus Stain Final report  Final    Comment: (NOTE) Performed At: [facility] [place], [place] [id number] Rush Farmer MD [id number]    Fungus (Mycology) Culture PENDING  Incomplete   Fungal Source  PLEURAL  Final    Comment: LEFT SPEC B ON SWABS Performed at [facility], [place] [address], [place],  [address]   Fungus Culture Result     Status: None   Collection Time: 01/26/19  9:41 AM  Result Value Ref Range Status   Result 1 Comment  Final    Comment: (NOTE) KOH/Calcofluor preparation:  no fungus observed. Performed At: [facility] [place], [place] [id number] Rush Farmer MD [id number]   Fungus Culture With Stain     Status: None (Preliminary result)   Collection Time: 01/26/19  9:50 AM  Result Value Ref Range Status   Fungus Stain Final report  Final    Comment: (NOTE) Performed At: [facility] [place], [place] [id number] Rush Farmer MD [id number]    Fungus (Mycology) Culture PENDING  Incomplete  Fungal Source TISSUE  Final    Comment: LEFT LUNG Performed at [facility], [place] [address] [place], [place] [address]   Aerobic/Anaerobic Culture (surgical/deep wound)     Status: None (Preliminary result)   Collection Time: 01/26/19  9:50 AM  Result Value Ref Range Status   Specimen Description TISSUE LEFT PLEURAL  Final   Special Requests PATIENT ON FOLLOWING ANCEF ZOSYN  Final   Gram Stain   Final    RARE WBC PRESENT, PREDOMINANTLY PMN NO ORGANISMS SEEN    Culture   Final    NO GROWTH 4 DAYS NO ANAEROBES ISOLATED; CULTURE IN PROGRESS FOR 5 DAYS Performed at [facility], [place] [address], [place], [place] [address]    Report Status PENDING  Incomplete  Acid Fast Smear (AFB)     Status: None   Collection Time: 01/26/19  9:50 AM  Result Value Ref Range Status   AFB Specimen Processing Comment  Final    Comment: Tissue Grinding and Digestion/Decontamination   Acid Fast Smear Negative  Final    Comment: (NOTE) Performed At: [facility] [place], [place] [id number] Rush Farmer MD YB:[id number]    Source (AFB) TISSUE  Final    Comment: LEFT LUNG  Performed at [facility], [place] [address] [place], [place] [address]   Fungus Culture Result     Status: None   Collection Time: 01/26/19  9:50 AM  Result Value Ref Range Status   Result 1 Comment  Final    Comment: (NOTE) KOH/Calcofluor preparation:  no fungus observed. Performed At: [facility] [place], [place] [id number] Rush Farmer MD HR:[id number]          Radiology Studies: Dg Chest 2 View  Result Date: 01/30/2019 CLINICAL DATA:  Follow-up pneumothorax EXAM: CHEST - 2 VIEW COMPARISON:  01/29/2019 FINDINGS: Cardiac shadow is within normal limits. Left-sided chest tube is been removed in the interval. There remains a mild amount of left pleural effusions stable from the prior exam. Some slight increased density is noted in the midportion of the left lung consistent with some posterior consolidation. Cavitary lesions in the left base are again seen and stable. The right lung remains clear. IMPRESSION: Interval chest tube removal without definitive pneumothorax. Stable cavitary lesions and posterior lower lobe consolidation. Electronically Signed   By: Inez Catalina M.D.   On: 01/30/2019 07:58   Dg Chest Port 1 View  Result Date: 01/29/2019 CLINICAL DATA:  History of pneumothorax. EXAM: PORTABLE CHEST 1 VIEW COMPARISON:  Chest radiograph 01/28/2019 FINDINGS: Monitoring leads overlie the patient. Stable cardiac and mediastinal contours. Left chest tube stable in position. Similar-appearing left mid and lower lung consolidation. Persistent small left pleural fluid. IMPRESSION: Stable position left chest tube with similar left lower lung opacity and small left effusion. Electronically Signed   By: Lovey Newcomer M.D.   On: 01/29/2019 08:20        Scheduled Meds: . acetaminophen  1,000 mg Oral Q6H   Or  . acetaminophen (TYLENOL) oral liquid 160 mg/5 mL  1,000 mg Oral Q6H  . albuterol  2 puff Inhalation Q6H  . atorvastatin  20 mg Oral q1800  . bisacodyl   10 mg Oral Daily  . Chlorhexidine Gluconate Cloth  6 each Topical Daily  . enoxaparin (LOVENOX) injection  40 mg Subcutaneous Q24H  . feeding supplement (ENSURE ENLIVE)  237 mL Oral BID BM  . insulin aspart  0-24 Units Subcutaneous TID  PC & HS  . insulin glargine  16 Units Subcutaneous Daily  . levothyroxine  25 mcg Oral Q0600  . losartan  25 mg Oral Daily  . senna-docusate  1 tablet Oral QHS  . sodium chloride flush  3 mL Intravenous Once   Continuous Infusions: . piperacillin-tazobactam (ZOSYN)  IV 3.375 g (01/30/19 1157)  . vancomycin 1,250 mg (01/30/19 0647)     LOS: 6 days   Time spent= 35 mins    Bonnell Public, MD [facility]  If 7PM-7AM, please contact night-coverage [email or web address] 01/30/2019, 1:14 PM

## 2019-01-30 NOTE — Plan of Care (Signed)
Progressing per plan of care.

## 2019-01-30 NOTE — Progress Notes (Addendum)
      [address]       [place] [address]             [phone]       4 Days Post-Op Procedure(s) (LRB): VIDEO BRONCHOSCOPY (N/A) VIDEO ASSISTED THORACOSCOPY (VATS)/DRAINAGE OF EMPYEMA  AND DECORTICATION (Left) TRANSESOPHAGEAL ECHOCARDIOGRAM (TEE) (N/A)  Subjective: Patient feeling better. He hopes to go home.  Objective: Vital signs in last 24 hours: Temp:  [97.7 F (36.5 C)-97.9 F (36.6 C)] 97.7 F (36.5 C) (06/08 0814) Pulse Rate:  [70-88] 88 (06/08 0814) Cardiac Rhythm: Normal sinus rhythm (06/08 0700) Resp:  [13-22] 21 (06/08 0814) BP: (114-135)/(76-94) 114/76 (06/08 0814) SpO2:  [96 %-99 %] 99 % (06/08 0814)      Intake/Output from previous day: 06/07 0701 - 06/08 0700 In: 684 [P.O.:684] Out: 6400 [Urine:6400]   Physical Exam:  Cardiovascular: RRR Pulmonary: Clear to auscultation on right and slighlty diminished left basilar breath sounds Abdomen: Soft, non tender, bowel sounds present. Extremities: No lower extremity edema. Wounds: Clean and dry.  No erythema or signs of infection.   Lab Results: CBC: Recent Labs    01/29/19 0244 01/30/19 0316  WBC 10.1 10.0  HGB 9.5* 10.3*  HCT 29.9* 32.2*  PLT 814* 860*   BMET:  Recent Labs    01/29/19 0244 01/30/19 0316  NA 138 134*  K 3.6 4.3  CL 100 100  CO2 30 27  GLUCOSE 132* 160*  BUN <5* <5*  CREATININE 0.59* 0.59*  CALCIUM 8.5* 8.9    PT/INR: No results for input(s): LABPROT, INR in the last 72 hours. ABG:  INR: Will add last result for INR, ABG once components are confirmed Will add last 4 CBG results once components are confirmed  Assessment/Plan:  1. CV - History of hypertension. SR in the 80's. He was restarted on low dose Losartan component of Hyzaar yesterday and now less hypertensive. 2.  Pulmonary - On room air.  CXR this am shows no pneumothorax,  left pleural consolidation. Check CXR in am.Encourage incentive spirometer. 3. ID-on Vancomycin and Zosyn.  Culture showed no growth to date. 4. Anemia-H and H this am stable at 10.3 and 32.2 6. Hypothyroidism-on Levothyroxine 25 mcg daily 7. DM- CBGs 160/192/148. On Insulin. He was on Metformin and Glimepiride prior to surgery. Per primary 8. I will arrange a follow up appointment and send Oxy prescription to pharmacy;management pre primary  Sharalyn Ink [facility] 01/30/2019,8:28 AM [phone]  CXR today shows air-fluid level right where the chest tube was positioned before removal Continue IV antibiotics and repeat CXR in am Will need 2 weeks of po doxycycline at DC  patient examined and medical record reviewed,agree with above note. Tharon Aquas Trigt III 01/30/2019

## 2019-01-30 NOTE — Progress Notes (Signed)
Pharmacy Antibiotic Note  Gary Hudson is a 53 y.o. male admitted on 01/23/2019 with empyema.  Pharmacy has been consulted for vancomycin dosing. Pt now s/p VATs.   6/8 AM update  AUC is supra-therapeutic at 654  Renal function appears stable  WBC normalized   Plan: Reduce vancomycin to 1250 mg IV q12h >>New estimated AUC: 543 Cont Zosyn 3.375G IV q8h to be infused over 4 hours Re-check vancomycin level as needed  Height: 5\' 11"  (180.3 cm) Weight: 179 lb 0.2 oz (81.2 kg) IBW/kg (Calculated) : 75.3  Temp (24hrs), Avg:97.8 F (36.6 C), Min:97.6 F (36.4 C), Max:97.9 F (36.6 C)  Recent Labs  Lab 01/23/19 2352 01/24/19 0146 01/24/19 0504 01/24/19 0857  01/26/19 0533 01/27/19 0348 01/28/19 0233 01/29/19 0244 01/29/19 1823 01/30/19 0316  WBC  --   --  19.0*  --    < > 12.9* 17.3* 11.5* 10.1  --  10.0  CREATININE  --   --  0.85 0.71   < > 0.73 0.62 0.55* 0.59*  --  0.59*  LATICACIDVEN 2.5* 1.4 1.2 1.5  --   --   --   --   --   --   --   VANCOTROUGH  --   --   --   --   --   --   --   --   --   --  14*  VANCOPEAK  --   --   --   --   --   --   --   --   --  48*  --    < > = values in this interval not displayed.    Estimated Creatinine Clearance: 112.4 mL/min (A) (by C-G formula based on SCr of 0.59 mg/dL (L)).    No Known Allergies   Narda Bonds, PharmD, BCPS Clinical Pharmacist Phone: [phone]

## 2019-01-31 ENCOUNTER — Inpatient Hospital Stay (HOSPITAL_COMMUNITY): Payer: 59

## 2019-01-31 LAB — ANAEROBIC CULTURE

## 2019-01-31 LAB — AEROBIC/ANAEROBIC CULTURE W GRAM STAIN (SURGICAL/DEEP WOUND): Culture: NO GROWTH

## 2019-01-31 LAB — GLUCOSE, CAPILLARY: Glucose-Capillary: 196 mg/dL — ABNORMAL HIGH (ref 70–99)

## 2019-01-31 MED ORDER — AMOXICILLIN-POT CLAVULANATE 875-125 MG PO TABS
1.0000 | ORAL_TABLET | Freq: Two times a day (BID) | ORAL | Status: DC
  Administered 2019-01-31: 1 via ORAL
  Filled 2019-01-31: qty 1

## 2019-01-31 MED ORDER — AMOXICILLIN-POT CLAVULANATE 875-125 MG PO TABS: 1.0000 | ORAL_TABLET | Freq: Two times a day (BID) | ORAL | 0 refills | Status: AC

## 2019-01-31 MED ORDER — DOXYCYCLINE HYCLATE 100 MG PO CAPS: 100.0000 mg | ORAL_CAPSULE | Freq: Two times a day (BID) | ORAL | 0 refills | Status: AC

## 2019-01-31 MED FILL — DOXYCYCLINE HYCLATE 100 MG: 100 | 14 days supply | Qty: 28 | Fill #0

## 2019-01-31 MED FILL — AMOX-CLAV 875-125 MG TABLET: 875-125 | 14 days supply | Qty: 28 | Fill #0

## 2019-01-31 NOTE — Progress Notes (Signed)
5 Days Post-Op Procedure(s) (LRB): VIDEO BRONCHOSCOPY (N/A) VIDEO ASSISTED THORACOSCOPY (VATS)/DRAINAGE OF EMPYEMA  AND DECORTICATION (Left) TRANSESOPHAGEAL ECHOCARDIOGRAM (TEE) (N/A) Subjective: Ready for DC  Objective: Vital signs in last 24 hours: Temp:  [97.6 F (36.4 C)-98.5 F (36.9 C)] 97.9 F (36.6 C) (06/09 0801) Pulse Rate:  [77-100] 100 (06/09 0801) Cardiac Rhythm: Normal sinus rhythm (06/09 0400) Resp:  [11-23] 11 (06/09 0801) BP: (92-114)/(54-76) 94/71 (06/09 0801) SpO2:  [95 %-100 %] 95 % (06/09 0801)  Hemodynamic parameters for last 24 hours:  afebrile  Intake/Output from previous day: 06/08 0701 - 06/09 0700 In: 790 [P.O.:240; IV Piggyback:550] Out: 1301 [Urine:1300; Stool:1] Intake/Output this shift: Total I/O In: 240 [P.O.:240] Out: 500 [Urine:500]  Incision clean, dry  Lab Results: Recent Labs    01/29/19 0244 01/30/19 0316  WBC 10.1 10.0  HGB 9.5* 10.3*  HCT 29.9* 32.2*  PLT 814* 860*   BMET:  Recent Labs    01/29/19 0244 01/30/19 0316  NA 138 134*  K 3.6 4.3  CL 100 100  CO2 30 27  GLUCOSE 132* 160*  BUN <5* <5*  CREATININE 0.59* 0.59*  CALCIUM 8.5* 8.9    PT/INR: No results for input(s): LABPROT, INR in the last 72 hours. ABG    Component Value Date/Time   PHART 7.434 01/27/2019 0350   HCO3 28.3 (H) 01/27/2019 0350   TCO2 31 01/26/2019 1002   O2SAT 95.7 01/27/2019 0350   CBG (last 3)  Recent Labs    01/30/19 1543 01/30/19 2102 01/31/19 0642  GLUCAP 160* 247* 196*    Assessment/Plan: S/P Procedure(s) (LRB): VIDEO BRONCHOSCOPY (N/A) VIDEO ASSISTED THORACOSCOPY (VATS)/DRAINAGE OF EMPYEMA  AND DECORTICATION (Left) TRANSESOPHAGEAL ECHOCARDIOGRAM (TEE) (N/A) Plan for discharge: see discharge orders po augmentin 2 weeks bid  Needs DM supplies as rec by DM coordinator Office appt for staple and suture removal in 7-190 days   LOS: 7 days    Gary Hudson 01/31/2019

## 2019-01-31 NOTE — Progress Notes (Signed)
Nsg Discharge Note  Admit Date:  01/23/2019 Discharge date: 01/31/2019   Gary Hudson to be D/C'd  per MD order.  AVS completed.  Patient/caregiver able to verbalize understanding.  Discharge Medication: Allergies as of 01/31/2019   No Known Allergies     Medication List    TAKE these medications   amoxicillin-clavulanate 875-125 MG tablet Commonly known as:  AUGMENTIN Take 1 tablet by mouth 2 (two) times daily for 14 days. Notes to patient:  01/31/19   atorvastatin 20 MG tablet Commonly known as:  LIPITOR Take 20 mg by mouth daily. Notes to patient:  01/31/19   doxycycline 100 MG capsule Commonly known as:  VIBRAMYCIN Take 1 capsule (100 mg total) by mouth 2 (two) times daily for 14 days. Notes to patient:  01/31/19   glimepiride 4 MG tablet Commonly known as:  AMARYL Take 4 mg by mouth daily with breakfast. Notes to patient:  02/01/19   levothyroxine 25 MCG tablet Commonly known as:  SYNTHROID Take 25 mcg by mouth daily before breakfast. Notes to patient:  02/01/19   losartan-hydrochlorothiazide 50-12.5 MG tablet Commonly known as:  HYZAAR Take 1 tablet by mouth daily. Notes to patient:  02/01/19   metFORMIN 500 MG tablet Commonly known as:  GLUCOPHAGE Take 500-1,000 mg by mouth See admin instructions. 1000mg  in the morning and 500mg  in the evening Notes to patient:  01/31/19   oxyCODONE 5 MG immediate release tablet Commonly known as:  Oxy IR/ROXICODONE Take 1 tablet (5 mg total) by mouth every 4 (four) hours as needed for severe pain.   Vitamin D (Ergocalciferol) 1.25 MG (50000 UT) Caps capsule Commonly known as:  DRISDOL Take 50,000 Units by mouth every 7 (seven) days. SUNDAYS Notes to patient:  Once a week       Discharge Assessment: Vitals:   01/31/19 0801 01/31/19 0833  BP: 94/71   Pulse: 100   Resp: 11   Temp: 97.9 F (36.6 C)   SpO2: 95% 96%   Skin clean, dry and intact without evidence of skin break down, no evidence of skin tears noted. IV  catheter discontinued intact. Site without signs and symptoms of complications - no redness or edema noted at insertion site, patient denies c/o pain - only slight tenderness at site.  Dressing with slight pressure applied.  D/c Instructions-Education: Discharge instructions given to patient/family with verbalized understanding. D/c education completed with patient/family including follow up instructions, medication list, d/c activities limitations if indicated, with other d/c instructions as indicated by MD - patient able to verbalize understanding, all questions fully answered. Patient instructed to return to ED, call 911, or call MD for any changes in condition.  Patient escorted via [place], and D/C home via private auto.  Merriam Brandner, Jolene Schimke, RN 01/31/2019 10:20 AM

## 2019-01-31 NOTE — Discharge Summary (Signed)
Physician Discharge Summary  Gary Hudson ZOX:[id number] DOB: 28-Jan-1966 DOA: 01/23/2019  PCP: Patient, No Pcp Per  Admit date: 01/23/2019 Discharge date: 01/31/2019  Admitted From: Home Disposition: Home  Recommendations for Outpatient Follow-up:  1. Follow up with PCP and cardiothoracic surgery in 1-2 weeks 2. Please obtain CBC/BMP/Mag at follow up 3. Please follow up on the following pending results: AFB culture  Home Health: None Equipment/Devices: None  Discharge Condition: Stable CODE STATUS: Full code   Hospital Course: 53 year old male with history of tobacco use, diabetes mellitus type 2, essential hypertension, hypothyroidism came to the hospital with exertional dyspnea and productive cough. COVID was negative. Chest x-ray showed concerns for a loculated effusion on the left side.CT scan showed worsening of cavitary mass with left-sided pleural effusion.  Cardiothoracic and pulmonary team were consulted pulmonary team recommended to check for TB with QuantiFERON gold which was indeterminate.  Started on broad-spectrum antibiotic (vancomycin and Zosyn) 01/23/2019 to 01/31/2019.  He underwent VATS and bronchoscopy on 01/26/2019 with removal of significant amount of purulent material and chest tube placement.  Chest tube was taken out on 6/80/20.  Pleural fluid cultures have not grown organisms.  AFB smear was negative.  AFB culture was pending at time of discharge.  Infectious disease was consulted for further course of antibiotic I recommended discharge on Augmentin and doxycycline for two weeks.  Patient was also in DKA that has resolved with IV insulin drips and he was transitioned to subcutaneous insulin and remained stable.  See individual problem list below for more. Discharge Diagnoses:  Sepsis secondary to left-sided loculated effusion with cavitary pneumonia: POA -Underwent VATS/thoracoscopy with chest tube placement on 01/26/2019 -Chest tube removed on 6/80/20 -Initial goal  QuantiFERON indeterminate -Pleural fluid culture and AFB smear negative -Pleural fluid AFB culture pending. -Vancomycin and Zosyn 6/1-6/9 -Augmentin and doxycycline per ID and CTS for 2 more weeks -Oxycodone for pain -Outpatient follow-up with CTS  Diabetic ketoacidosis: Resolved Poorly controlled DM-2: A1c 11.4% -Discharged on home glimepiride and metformin -Continue home statin -Recommend initiating insulin or newer agents with cardiovascular benefits at follow-up.  Essential hypertension -Discharged on losartan HCTZ  Anemia of chronic disease: Stable  Hypothyroidism: TSH within normal limits -Discharged on home Synthroid  Hyperlipidemia -Continue home statin  Discharge Instructions  Discharge Instructions    Call MD for:  difficulty breathing, headache or visual disturbances   Complete by:  As directed    Call MD for:  persistant nausea and vomiting   Complete by:  As directed    Call MD for:  severe uncontrolled pain   Complete by:  As directed    Call MD for:  temperature >100.4   Complete by:  As directed    Diet - low sodium heart healthy   Complete by:  As directed    Diet Carb Modified   Complete by:  As directed    Increase activity slowly   Complete by:  As directed      Allergies as of 01/31/2019   No Known Allergies     Medication List    TAKE these medications   amoxicillin-clavulanate 875-125 MG tablet Commonly known as:  AUGMENTIN Take 1 tablet by mouth 2 (two) times daily for 14 days.   atorvastatin 20 MG tablet Commonly known as:  LIPITOR Take 20 mg by mouth daily.   doxycycline 100 MG capsule Commonly known as:  VIBRAMYCIN Take 1 capsule (100 mg total) by mouth 2 (two) times daily for 14 days.  glimepiride 4 MG tablet Commonly known as:  AMARYL Take 4 mg by mouth daily with breakfast.   levothyroxine 25 MCG tablet Commonly known as:  SYNTHROID Take 25 mcg by mouth daily before breakfast.   losartan-hydrochlorothiazide 50-12.5 MG  tablet Commonly known as:  HYZAAR Take 1 tablet by mouth daily.   metFORMIN 500 MG tablet Commonly known as:  GLUCOPHAGE Take 500-1,000 mg by mouth See admin instructions. 1000mg  in the morning and 500mg  in the evening   oxyCODONE 5 MG immediate release tablet Commonly known as:  Oxy IR/ROXICODONE Take 1 tablet (5 mg total) by mouth every 4 (four) hours as needed for severe pain.   Vitamin D (Ergocalciferol) 1.25 MG (50000 UT) Caps capsule Commonly known as:  DRISDOL Take 50,000 Units by mouth every 7 (seven) days. SUNDAYS      Follow-up Information    Ivin Poot, MD. Go on 03/01/2019.   Specialty:  Cardiothoracic Surgery Why:  PA/LAT CXR to be taken (at [facility] which is in the same building as Dr. Lucianne Lei Trigt's office) on 07/08 at 9:00 [id number] time is at 9:30 am Contact information: [address] [place] [address] [phone]           Consultations:  Cardiothoracic surgery  PCCM  Infectious disease  Procedures/Studies:  2D Echo: Intraoperative TEE did not show arctic valve or mitral valve vegetation or significant regurgitation.  Dg Chest 2 View  Result Date: 01/30/2019 CLINICAL DATA:  Follow-up pneumothorax EXAM: CHEST - 2 VIEW COMPARISON:  01/29/2019 FINDINGS: Cardiac shadow is within normal limits. Left-sided chest tube is been removed in the interval. There remains a mild amount of left pleural effusions stable from the prior exam. Some slight increased density is noted in the midportion of the left lung consistent with some posterior consolidation. Cavitary lesions in the left base are again seen and stable. The right lung remains clear. IMPRESSION: Interval chest tube removal without definitive pneumothorax. Stable cavitary lesions and posterior lower lobe consolidation. Electronically Signed   By: Inez Catalina M.D.   On: 01/30/2019 07:58   Ct Chest W Contrast  Result Date: 01/24/2019 CLINICAL DATA:  Increasing shortness  of breath. EXAM: CT CHEST WITH CONTRAST TECHNIQUE: Multidetector CT imaging of the chest was performed during intravenous contrast administration. CONTRAST:  79mL OMNIPAQUE IOHEXOL 300 MG/ML  SOLN COMPARISON:  01/20/2019 FINDINGS: Cardiovascular: Normal heart size. No pericardial effusion. There is aortic and coronary atherosclerosis. No acute vascular finding Mediastinum/Nodes: Enlarged left hilar and bronchial nodes measuring up to 16 mm short axis. Lungs/Pleura: Re- demonstrated cavity with thick wall in the medial left lower lobe measuring 4.6 cm. There is also a cavity with air-fluid level in the more apical left lower lobe measuring 3.5 cm. The adjacent parenchyma is opacified by ground-glass opacity and consolidation. There is an adjacent thick walled collection within the pleural space which measures 11.4 cm in length by 4 cm in thickness. Appearance suggests empyema from necrotic pneumonia. Ill-defined increased soft tissue density in the left lower lobe that narrows the central left lower lobe airways, possible underlying obstructive mass. Background generalized airway thickening with mild emphysema. Upper Abdomen: No acute finding Musculoskeletal: None of the adjacent left ribs are eroded. No acute or aggressive finding. IMPRESSION: 1. 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 Prominent soft tissue density with airway narrowing in the central left lower lobe, possible underlying obstructive mass. No significant change compared to 01/20/2019. 2. COPD. Electronically Signed  By: Monte Fantasia M.D.   On: 01/24/2019 05:37   Dg Chest Port 1 View  Result Date: 01/29/2019 CLINICAL DATA:  History of pneumothorax. EXAM: PORTABLE CHEST 1 VIEW COMPARISON:  Chest radiograph 01/28/2019 FINDINGS: Monitoring leads overlie the patient. Stable cardiac and mediastinal contours. Left chest tube stable in position. Similar-appearing left mid and lower lung consolidation.  Persistent small left pleural fluid. IMPRESSION: Stable position left chest tube with similar left lower lung opacity and small left effusion. Electronically Signed   By: Lovey Newcomer M.D.   On: 01/29/2019 08:20   Dg Chest Port 1 View  Result Date: 01/28/2019 CLINICAL DATA:  Patient with empyema. EXAM: PORTABLE CHEST 1 VIEW COMPARISON:  Chest radiograph 01/27/2019 FINDINGS: Monitoring leads overlie the patient. Stable position left chest tube. Increasing opacities within the left lower hemithorax. Mild increasing pleural fluid left lower hemithorax. No pneumothorax. IMPRESSION: Left chest tube remains in place. Mild increase in opacity within the left lung base. Suggestion of mild increased left pleural fluid. Electronically Signed   By: Lovey Newcomer M.D.   On: 01/28/2019 05:55   Dg Chest Port 1 View  Result Date: 01/27/2019 CLINICAL DATA:  Chest tube.  Empyema EXAM: PORTABLE CHEST 1 VIEW COMPARISON:  Yesterday FINDINGS: Left-sided chest tube in place. No visible pneumothorax or increasing pleural fluid. There is mild increased parenchymal opacity at the left base with obscured diaphragm. There is residual airspace and pleural opacity on the left with hazy appearance over the mid chest. IMPRESSION: Mild increased parenchymal opacity at the left base. No visible pneumothorax or increasing pleural fluid. Electronically Signed   By: Monte Fantasia M.D.   On: 01/27/2019 08:57   Dg Chest Portable 1 View  Result Date: 01/26/2019 CLINICAL DATA:  Status post left VATS EXAM: PORTABLE CHEST 1 VIEW COMPARISON:  01/23/2019 FINDINGS: Cardiac shadow is within normal limits. Endotracheal tube extends into the left mainstem bronchus. Two chest tubes are noted on the left with evacuation of previously seen empyema. Focal atelectatic changes in the left upper lobe are noted. No other focal abnormality is seen. IMPRESSION: No pneumothorax is noted. Chest tubes on the left with evacuation of previously seen empyema. Increased  density in the left apex likely representing atelectasis. Electronically Signed   By: Inez Catalina M.D.   On: 01/26/2019 11:34   Dg Chest Portable 1 View  Result Date: 01/24/2019 CLINICAL DATA:  Cough and fever EXAM: PORTABLE CHEST 1 VIEW COMPARISON:  Chest CT 01/20/2019 FINDINGS: Loculated pleural collection within the left hemithorax. Right lung is clear. Cardiac contours are normal. Left lower lobe cavitary mass poorly characterized. IMPRESSION: Loculated fluid collection within the left hemithorax, as previously demonstrated by CT. Cavitary mass of the left lower lobe is not clearly demonstrated this study. Electronically Signed   By: Ulyses Jarred M.D.   On: 01/24/2019 00:42      Subjective: No major events overnight of this morning.  Patient is already dressed up and ready to go home after evaluated by cardiothoracic surgery.  No complaint this morning.  Denies chest pain, dyspnea, abdominal pain or nausea.  He says his right is on the way and asked to be discharged as soon as possible.   Discharge Exam: Vitals:   01/31/19 0801 01/31/19 0833  BP: 94/71   Pulse: 100   Resp: 11   Temp: 97.9 F (36.6 C)   SpO2: 95% 96%    GENERAL: No acute distress.  Appears well.  HEENT: MMM.  Vision and hearing  grossly intact.  NECK: Supple.  No JVD.  LUNGS:  No IWOB. Good air movement bilaterally. HEART:  RRR. Heart sounds normal.  ABD: Bowel sounds present. Soft. Non tender.  MSK/EXT:  Moves all extremities. No apparent deformity. No edema bilaterally. SKIN: no apparent skin lesion or wound NEURO: Awake, alert and oriented appropriately.  No gross deficit.  PSYCH: Calm. Normal affect.     The results of significant diagnostics from this hospitalization (including imaging, microbiology, ancillary and laboratory) are listed below for reference.     Microbiology: Recent Results (from the past 240 hour(s))  Culture, blood (Routine x 2)     Status: None   Collection Time: 01/23/19 11:30 PM   Result Value Ref Range Status   Specimen Description BLOOD LEFT ARM  Final   Special Requests   Final    BOTTLES DRAWN AEROBIC AND ANAEROBIC Blood Culture results may not be optimal due to an excessive volume of blood received in culture bottles   Culture   Final    NO GROWTH 5 DAYS Performed at [facility], [place] [address], [place], [place] [address]    Report Status 01/29/2019 FINAL  Final  Culture, blood (Routine x 2)     Status: None   Collection Time: 01/23/19 11:48 PM  Result Value Ref Range Status   Specimen Description BLOOD RIGHT ARM  Final   Special Requests   Final    BOTTLES DRAWN AEROBIC ONLY Blood Culture results may not be optimal due to an excessive volume of blood received in culture bottles   Culture   Final    NO GROWTH 5 DAYS Performed at [facility], [place] [address] [place], [place] [address]    Report Status 01/29/2019 FINAL  Final  SARS Coronavirus 2 (CEPHEID- Performed in [facility] hospital lab), Hosp Order     Status: None   Collection Time: 01/24/19  1:48 AM  Result Value Ref Range Status   SARS Coronavirus 2 NEGATIVE NEGATIVE Final    Comment: (NOTE) If result is NEGATIVE SARS-CoV-2 target nucleic acids are NOT DETECTED. The SARS-CoV-2 RNA is generally detectable in upper and lower  respiratory specimens during the acute phase of infection. The lowest  concentration of SARS-CoV-2 viral copies this assay can detect is 250  copies / mL. A negative result does not preclude SARS-CoV-2 infection  and should not be used as the sole basis for treatment or other  patient management decisions.  A negative result may occur with  improper specimen collection / handling, submission of specimen other  than nasopharyngeal swab, presence of viral mutation(s) within the  areas targeted by this assay, and inadequate number of viral copies  (<250 copies / mL). A negative result must be combined with clinical  observations, patient history, and  epidemiological information. If result is POSITIVE SARS-CoV-2 target nucleic acids are DETECTED. The SARS-CoV-2 RNA is generally detectable in upper and lower  respiratory specimens dur ing the acute phase of infection.  Positive  results are indicative of active infection with SARS-CoV-2.  Clinical  correlation with patient history and other diagnostic information is  necessary to determine patient infection status.  Positive results do  not rule out bacterial infection or co-infection with other viruses. If result is PRESUMPTIVE POSTIVE SARS-CoV-2 nucleic acids MAY BE PRESENT.   A presumptive positive result was obtained on the submitted specimen  and confirmed on repeat testing.  While 2019 novel coronavirus  (SARS-CoV-2) nucleic acids may be present  in the submitted sample  additional confirmatory testing may be necessary for epidemiological  and / or clinical management purposes  to differentiate between  SARS-CoV-2 and other Sarbecovirus currently known to infect humans.  If clinically indicated additional testing with an alternate test  methodology [phone]) is advised. The SARS-CoV-2 RNA is generally  detectable in upper and lower respiratory sp ecimens during the acute  phase of infection. The expected result is Negative. Fact Sheet for Patients:  StrictlyIdeas.no Fact Sheet for Healthcare Providers: BankingDealers.co.za This test is not yet approved or cleared by the [place] FDA and has been authorized for detection and/or diagnosis of SARS-CoV-2 by FDA under an Emergency Use Authorization (EUA).  This EUA will remain in effect (meaning this test can be used) for the duration of the COVID-19 declaration under Section 564(b)(1) of the Act, 21 U.S.C. section 360bbb-3(b)(1), unless the authorization is terminated or revoked sooner. Performed at [facility], [place] [address] [place], [place] [address]   Surgical pcr  screen     Status: None   Collection Time: 01/24/19  6:04 PM  Result Value Ref Range Status   MRSA, PCR NEGATIVE NEGATIVE Final   Staphylococcus aureus NEGATIVE NEGATIVE Final    Comment: (NOTE) The Xpert SA Assay (FDA approved for NASAL specimens in patients 36 years of age and older), is one component of a comprehensive surveillance program. It is not intended to diagnose infection nor to guide or monitor treatment. Performed at [facility], [place] [address] [place], [place] [address]   Anaerobic culture     Status: None (Preliminary result)   Collection Time: 01/26/19  9:37 AM  Result Value Ref Range Status   Specimen Description PLEURAL LEFT  Final   Special Requests   Final    PATIENT ON FOLLOWING ANCEF ZOSYN Performed at [facility], [place] [address], [place], [place] [address]    Culture   Final    NO ANAEROBES ISOLATED; CULTURE IN PROGRESS FOR 5 DAYS   Report Status PENDING  Incomplete  Body fluid culture     Status: None   Collection Time: 01/26/19  9:37 AM  Result Value Ref Range Status   Specimen Description PLEURAL LEFT  Final   Special Requests PATIENT ON FOLLOWING ANCEF ZOSYN  Final   Gram Stain   Final    ABUNDANT WBC PRESENT,BOTH PMN AND MONONUCLEAR NO ORGANISMS SEEN    Culture   Final    NO GROWTH 3 DAYS Performed at [facility], [place] [address] [place], [place] [address]    Report Status 01/29/2019 FINAL  Final  Fungus Culture With Stain     Status: None (Preliminary result)   Collection Time: 01/26/19  9:37 AM  Result Value Ref Range Status   Fungus Stain Final report  Final    Comment: (NOTE) Performed At: [facility] [place], [place] [id number] Rush Farmer MD [id number]    Fungus (Mycology) Culture PENDING  Incomplete   Fungal Source PLEURAL  Final    Comment: LEFT Performed at [facility], [place] [address], [place], [place] [address]   Acid Fast Smear (AFB)     Status: None   Collection  Time: 01/26/19  9:37 AM  Result Value Ref Range Status   AFB Specimen Processing Concentration  Final   Acid Fast Smear Negative  Final    Comment: (NOTE) Performed At: [facility] [address] [place], [place] [id number] Perlie Gold  Derinda Late MD [id number]    Source (AFB) PLEURAL  Final    Comment: LEFT Performed at [facility], [place] [address], [place], [place] [address]   Fungus Culture Result     Status: None   Collection Time: 01/26/19  9:37 AM  Result Value Ref Range Status   Result 1 Comment  Final    Comment: (NOTE) KOH/Calcofluor preparation:  no fungus observed. Performed At: [facility] [place], [place] [id number] Rush Farmer MD [id number]   Anaerobic culture     Status: None (Preliminary result)   Collection Time: 01/26/19  9:41 AM  Result Value Ref Range Status   Specimen Description PLEURAL LEFT SPEC B ON SWABS  Final   Special Requests   Final    PATIENT ON FOLLOWING ANCEF ZOSYN Performed at [facility], [address], [place], [place] [address]    Culture   Final    NO ANAEROBES ISOLATED; CULTURE IN PROGRESS FOR 5 DAYS   Report Status PENDING  Incomplete  Body fluid culture     Status: None   Collection Time: 01/26/19  9:41 AM  Result Value Ref Range Status   Specimen Description PLEURAL LEFT SPEC B ON SWABS  Final   Special Requests PATIENT ON FOLLOWING ANCEF ZOSYN  Final   Gram Stain NO WBC SEEN NO ORGANISMS SEEN   Final   Culture   Final    NO GROWTH 3 DAYS Performed at [facility], [place] [address], [place], [place] [address]    Report Status 01/29/2019 FINAL  Final  Fungus Culture With Stain     Status: None (Preliminary result)   Collection Time: 01/26/19  9:41 AM  Result Value Ref Range Status   Fungus Stain Final report  Final    Comment: (NOTE) Performed At: [facility] [place], [place] [id number] Rush Farmer MD [id number]    Fungus (Mycology) Culture  PENDING  Incomplete   Fungal Source PLEURAL  Final    Comment: LEFT SPEC B ON SWABS Performed at [facility], [place] [address] [place], [place] [address]   Acid Fast Smear (AFB)     Status: None   Collection Time: 01/26/19  9:41 AM  Result Value Ref Range Status   AFB Specimen Processing Concentration  Final   Acid Fast Smear Negative  Final    Comment: (NOTE) Performed At: [facility] [place], [place] [id number] Rush Farmer MD TD:[id number]    Source (AFB) PLEURAL  Final    Comment: LEFT SPEC B ON SWABS Performed at [facility], [place] [address] [place], [place] [address]   Fungus Culture Result     Status: None   Collection Time: 01/26/19  9:41 AM  Result Value Ref Range Status   Result 1 Comment  Final    Comment: (NOTE) KOH/Calcofluor preparation:  no fungus observed. Performed At: [facility] [place], [place] [id number] Rush Farmer MD [id number]   Fungus Culture With Stain     Status: None (Preliminary result)   Collection Time: 01/26/19  9:50 AM  Result Value Ref Range Status   Fungus Stain Final report  Final    Comment: (NOTE) Performed At: [facility] [place], [place] [id number] Rush Farmer MD [id number]    Fungus (Mycology) Culture PENDING  Incomplete   Fungal Source TISSUE  Final    Comment: LEFT LUNG Performed at [facility], [place] [address] [place], [place] [address]   Aerobic/Anaerobic Culture (surgical/deep wound)     Status: None (Preliminary result)   Collection Time: 01/26/19  9:50 AM  Result Value Ref Range Status   Specimen Description TISSUE LEFT PLEURAL  Final   Special Requests PATIENT ON FOLLOWING ANCEF ZOSYN  Final   Gram Stain   Final    RARE WBC PRESENT, PREDOMINANTLY PMN NO ORGANISMS SEEN    Culture   Final    NO GROWTH 4 DAYS NO ANAEROBES ISOLATED; CULTURE IN PROGRESS FOR 5 DAYS Performed at [facility], [place] [address], [place], [place] [address]    Report Status PENDING  Incomplete  Acid Fast Smear (AFB)     Status: None   Collection Time: 01/26/19  9:50 AM  Result Value Ref Range Status   AFB Specimen Processing Comment  Final    Comment: Tissue Grinding and Digestion/Decontamination   Acid Fast Smear Negative  Final    Comment: (NOTE) Performed At: [facility] [place], [place] [id number] Rush Farmer MD [id number]    Source (AFB) TISSUE  Final    Comment: LEFT LUNG Performed at [facility], [place] [address] [place], [place] [address]   Fungus Culture Result     Status: None   Collection Time: 01/26/19  9:50 AM  Result Value Ref Range Status   Result 1 Comment  Final    Comment: (NOTE) KOH/Calcofluor preparation:  no fungus observed. Performed At: [facility] [place], [place] [id number] Rush Farmer MD [id number]      Labs: BNP (last 3 results) Recent Labs    01/26/19 0533  BNP 416.6*   Basic Metabolic Panel: Recent Labs  Lab 01/26/19 0533  01/26/19 1002 01/27/19 0348 01/28/19 0233 01/29/19 0244 01/30/19 0316  NA 129*   < > 131* 130* 135 138 134*  K 3.2*   < > 3.6 3.9 3.2* 3.6 4.3  CL 92*  --   --  93* 98 100 100  CO2 24  --   --  25 28 30 27   GLUCOSE 210*  --   --  237* 227* 132* 160*  BUN 5*  --   --  11 10 <5* <5*  CREATININE 0.73  --   --  0.62 0.55* 0.59* 0.59*  CALCIUM 8.1*  --   --  8.5* 8.2* 8.5* 8.9  MG 1.8  --   --  2.1 1.9 1.8 2.0  PHOS  --   --   --   --   --   --  4.1   < > = values in this interval not displayed.   Liver Function Tests: Recent Labs  Lab 01/28/19 0233 01/30/19 0316  AST 25  --   ALT 24  --   ALKPHOS 156*  --   BILITOT 0.2*  --   PROT 5.4*  --   ALBUMIN 1.7* 2.1*   No results for input(s): LIPASE, AMYLASE in the last 168 hours. No results for input(s): AMMONIA in the last 168 hours. CBC: Recent Labs  Lab 01/26/19 0533  01/26/19 1002 01/27/19 0348 01/28/19 0233  01/29/19 0244 01/30/19 0316  WBC 12.9*  --   --  17.3* 11.5* 10.1 10.0  NEUTROABS  --   --   --   --   --   --  5.7  HGB 9.4*   < > 10.2* 10.1* 9.3* 9.5* 10.3*  HCT  29.1*   < > 30.0* 31.1* 29.3* 29.9* 32.2*  MCV 84.8  --   --  85.4 85.9 86.4 86.1  PLT 639*  --   --  759* 731* 814* 860*   < > = values in this interval not displayed.   Cardiac Enzymes: Recent Labs  Lab 01/24/19 0857  TROPONINI <0.03   BNP: Invalid input(s): POCBNP CBG: Recent Labs  Lab 01/30/19 0622 01/30/19 1141 01/30/19 1543 01/30/19 2102 01/31/19 0642  GLUCAP 148* 292* 160* 247* 196*   D-Dimer No results for input(s): DDIMER in the last 72 hours. Hgb A1c No results for input(s): HGBA1C in the last 72 hours. Lipid Profile No results for input(s): CHOL, HDL, LDLCALC, TRIG, CHOLHDL, LDLDIRECT in the last 72 hours. Thyroid function studies No results for input(s): TSH, T4TOTAL, T3FREE, THYROIDAB in the last 72 hours.  Invalid input(s): FREET3 Anemia work up No results for input(s): VITAMINB12, FOLATE, FERRITIN, TIBC, IRON, RETICCTPCT in the last 72 hours. Urinalysis    Component Value Date/Time   COLORURINE YELLOW 01/24/2019 1845   APPEARANCEUR CLEAR 01/24/2019 1845   LABSPEC 1.021 01/24/2019 1845   PHURINE 7.0 01/24/2019 1845   GLUCOSEU 50 (A) 01/24/2019 1845   HGBUR NEGATIVE 01/24/2019 1845   BILIRUBINUR NEGATIVE 01/24/2019 1845   KETONESUR NEGATIVE 01/24/2019 1845   PROTEINUR 30 (A) 01/24/2019 1845   NITRITE NEGATIVE 01/24/2019 1845   LEUKOCYTESUR NEGATIVE 01/24/2019 1845   Sepsis Labs Invalid input(s): PROCALCITONIN,  WBC,  LACTICIDVEN   Time coordinating discharge: 35 minutes  SIGNED:  Mercy Riding, MD  [facility] 01/31/2019, 8:47 AM Pager [phone]  If 7PM-7AM, please contact night-coverage [email or web address] Password [email or web address]

## 2019-02-06 ENCOUNTER — Other Ambulatory Visit: Payer: Self-pay | Admitting: *Deleted

## 2019-02-06 DIAGNOSIS — G8918 Other acute postprocedural pain: Secondary | ICD-10-CM

## 2019-02-06 MED ORDER — TRAMADOL HCL 50 MG PO TABS: 50.0000 mg | ORAL_TABLET | ORAL | 0 refills | Status: DC | PRN

## 2019-02-09 ENCOUNTER — Institutional Professional Consult (permissible substitution): Payer: 59 | Admitting: Pulmonary Disease

## 2019-02-09 NOTE — Progress Notes (Deleted)
    Subjective:   PATIENT ID: Gary Hudson GENDER: male DOB: January 28, 1966, MRN: [id number]   HPI  No chief complaint on file.   Reason for Visit: ***Follow-up ***New consult for ***  *** Social History:  Environmental exposures: ***  I have personally reviewed patient's past medical/family/social history, allergies, current medications.***  Past Medical History:  Diagnosis Date  . Diabetes mellitus without complication ([place])      Family History  Problem Relation Age of Onset  . CAD Father   . Diabetes Mellitus II Neg Hx      Social History   Occupational History  . Not on file  Tobacco Use  . Smoking status: Former Research scientist (life sciences)  . Smokeless tobacco: Never Used  Substance and Sexual Activity  . Alcohol use: Never    Frequency: Never  . Drug use: Never  . Sexual activity: Not on file    No Known Allergies   Outpatient Medications Prior to Visit  Medication Sig Dispense Refill  . amoxicillin-clavulanate (AUGMENTIN) 875-125 MG tablet Take 1 tablet by mouth 2 (two) times daily for 14 days. 28 tablet 0  . atorvastatin (LIPITOR) 20 MG tablet Take 20 mg by mouth daily.   1  . doxycycline (VIBRAMYCIN) 100 MG capsule Take 1 capsule (100 mg total) by mouth 2 (two) times daily for 14 days. 28 capsule 0  . glimepiride (AMARYL) 4 MG tablet Take 4 mg by mouth daily with breakfast.   0  . levothyroxine (SYNTHROID, LEVOTHROID) 25 MCG tablet Take 25 mcg by mouth daily before breakfast.   1  . losartan-hydrochlorothiazide (HYZAAR) 50-12.5 MG tablet Take 1 tablet by mouth daily.   1  . metFORMIN (GLUCOPHAGE) 500 MG tablet Take 500-1,000 mg by mouth See admin instructions. 1000mg  in the morning and 500mg  in the evening  1  . oxyCODONE (OXY IR/ROXICODONE) 5 MG immediate release tablet Take 1 tablet (5 mg total) by mouth every 4 (four) hours as needed for severe pain. 30 tablet 0  . traMADol (ULTRAM) 50 MG tablet Take 1 tablet (50 mg total) by mouth every 4 (four) hours as needed for  severe pain. 28 tablet 0  . Vitamin D, Ergocalciferol, (DRISDOL) 50000 units CAPS capsule Take 50,000 Units by mouth every 7 (seven) days. SUNDAYS  0   No facility-administered medications prior to visit.     ROS   Objective:  There were no vitals filed for this visit.    Physical Exam: General: Well-appearing, no acute distress HENT: [place], AT, OP clear, MMM Eyes: EOMI, no scleral icterus Lymph: no cervical lymphadenopathy Respiratory: Clear to auscultation bilaterally.  No crackles, wheezing or rales Cardiovascular: RRR, -M/R/G, no JVD GI: BS+, soft, nontender Extremities:-Edema,-tenderness Neuro: AAO x4, CNII-XII grossly intact Skin: Intact, no rashes or bruising Psych: Normal mood, normal affect  Data Reviewed:  Imaging:  PFT:  Labs:  Imaging, labs and tests noted above have been reviewed independently by me.    Assessment & Plan:   Discussion: ***  Health Maintenance Pneumonia*** Influenza*** CT Lung Screen***  No orders of the defined types were placed in this encounter. No orders of the defined types were placed in this encounter.   No follow-ups on file.  Blue Ball, MD [facility] 02/09/2019 12:43 PM  Office Number [phone]

## 2019-02-10 ENCOUNTER — Encounter (INDEPENDENT_AMBULATORY_CARE_PROVIDER_SITE_OTHER): Payer: Self-pay

## 2019-02-10 ENCOUNTER — Other Ambulatory Visit: Payer: Self-pay

## 2019-02-10 DIAGNOSIS — Z4802 Encounter for removal of sutures: Secondary | ICD-10-CM

## 2019-02-27 LAB — FUNGAL ORGANISM REFLEX

## 2019-02-27 LAB — FUNGUS CULTURE RESULT

## 2019-02-27 LAB — FUNGUS CULTURE WITH STAIN

## 2019-02-28 LAB — FUNGUS CULTURE WITH STAIN

## 2019-02-28 LAB — FUNGAL ORGANISM REFLEX

## 2019-02-28 LAB — FUNGUS CULTURE RESULT

## 2019-03-01 ENCOUNTER — Other Ambulatory Visit: Payer: Self-pay

## 2019-03-01 ENCOUNTER — Other Ambulatory Visit: Payer: Self-pay | Admitting: Cardiothoracic Surgery

## 2019-03-01 ENCOUNTER — Ambulatory Visit
Admission: RE | Admit: 2019-03-01 | Discharge: 2019-03-01 | Disposition: A | Discharge status: Home / Self Care | Payer: 59 | Source: Ambulatory Visit | Attending: Cardiothoracic Surgery | Admitting: Cardiothoracic Surgery

## 2019-03-01 ENCOUNTER — Ambulatory Visit: Payer: 59 | Admitting: Cardiothoracic Surgery

## 2019-03-01 DIAGNOSIS — J869 Pyothorax without fistula: Secondary | ICD-10-CM

## 2019-03-02 ENCOUNTER — Encounter: Payer: Self-pay | Admitting: Cardiothoracic Surgery

## 2019-03-02 ENCOUNTER — Ambulatory Visit (INDEPENDENT_AMBULATORY_CARE_PROVIDER_SITE_OTHER): Payer: Self-pay | Admitting: Cardiothoracic Surgery

## 2019-03-02 VITALS — BP 132/81 | HR 95 | Temp 97.7°F | Resp 16 | Ht 71.0 in | Wt 184.2 lb

## 2019-03-02 DIAGNOSIS — J869 Pyothorax without fistula: Secondary | ICD-10-CM

## 2019-03-02 DIAGNOSIS — Z09 Encounter for follow-up examination after completed treatment for conditions other than malignant neoplasm: Secondary | ICD-10-CM

## 2019-03-02 MED ORDER — GABAPENTIN 300 MG PO CAPS: 300.0000 mg | ORAL_CAPSULE | Freq: Three times a day (TID) | ORAL | 1 refills | Status: AC

## 2019-03-02 NOTE — Addendum Note (Signed)
Addended by: Floyde Parkins MD, Storm Sovine on: 03/02/2019 10:09 AM   Modules accepted: Orders

## 2019-03-02 NOTE — Progress Notes (Signed)
PCP is Patient, No Pcp Per Referring Provider is Damita Lack, MD  Chief Complaint  Patient presents with  . Routine Post Op    f/u from surgery with CXR s/p  LT VATS, drainage of empyema cavity, decortication of left lower lobe    HPI: Patient returns for 1 month follow-up after left VATS, drainage of empyema, decortication of left lower lobe and debridement of left lower lobe lung abscess.  Pathology was negative for malignancy.  Patient has finished his course of oral antibiotics.  VATS incision has healed well.  Patient still has expected post thoracotomy pain and will be given a prescription for Neurontin. Patient is return to work and complains of soreness raising his left arm.  He was reassured this is normal. Chest x-ray image taken today personally reviewed showing significant improvement in left lower lobe opacification, no significant pleural effusion, some scarring present in the area of the previous abscess.  Past Medical History:  Diagnosis Date  . Diabetes mellitus without complication [facility])     Past Surgical History:  Procedure Laterality Date  . TEE WITHOUT CARDIOVERSION N/A 01/26/2019   Procedure: TRANSESOPHAGEAL ECHOCARDIOGRAM (TEE);  Surgeon: Prescott Gum, Collier Salina, MD;  Location: [facility] OR;  Service: Thoracic;  Laterality: N/A;  . VIDEO ASSISTED THORACOSCOPY (VATS)/EMPYEMA Left 01/26/2019   Procedure: VIDEO ASSISTED THORACOSCOPY (VATS)/DRAINAGE OF EMPYEMA  AND DECORTICATION;  Surgeon: Ivin Poot, MD;  Location: [facility];  Service: Thoracic;  Laterality: Left;  Marland Kitchen VIDEO BRONCHOSCOPY N/A 01/26/2019   Procedure: VIDEO BRONCHOSCOPY;  Surgeon: Prescott Gum, Collier Salina, MD;  Location: [facility] OR;  Service: Thoracic;  Laterality: N/A;    Family History  Problem Relation Age of Onset  . CAD Father   . Diabetes Mellitus II Neg Hx     Social History Social History   Tobacco Use  . Smoking status: Former Research scientist (life sciences)  . Smokeless tobacco: Never Used  Substance Use Topics  . Alcohol use:  Never    Frequency: Never  . Drug use: Never    Current Outpatient Medications  Medication Sig Dispense Refill  . atorvastatin (LIPITOR) 20 MG tablet Take 20 mg by mouth daily.   1  . glimepiride (AMARYL) 4 MG tablet Take 4 mg by mouth daily with breakfast.   0  . levothyroxine (SYNTHROID, LEVOTHROID) 25 MCG tablet Take 25 mcg by mouth daily before breakfast.   1  . losartan-hydrochlorothiazide (HYZAAR) 50-12.5 MG tablet Take 1 tablet by mouth daily.   1  . metFORMIN (GLUCOPHAGE) 500 MG tablet Take 500-1,000 mg by mouth See admin instructions. 1000mg  in the morning and 500mg  in the evening  1  . Vitamin D, Ergocalciferol, (DRISDOL) 50000 units CAPS capsule Take 50,000 Units by mouth every 7 (seven) days. SUNDAYS  0   No current facility-administered medications for this visit.     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  Left VATS incision well-healed   Cor- regular rate and rhythm, no murmur , gallop   Abdomen- soft, non-tender   Extremities - warm, non-tender, minimal edema   Neuro- oriented, appropriate, no focal weakness   Diagnostic Tests: Chest x-ray with significant improvement, minimal postoperative changes and some pleural thickening  Impression: Excellent early recovery after drainage of empyema and left lung abscess and decortication of left lower lobe.  He complains of post  thoracotomy pain and will be given a course of oral Neurontin.  He states the tramadol is not effective.  Plan: Return in 6 months with CT scan of chest.   Len Childs, MD [facility] [phone]

## 2019-03-11 LAB — ACID FAST CULTURE WITH REFLEXED SENSITIVITIES (MYCOBACTERIA)
Acid Fast Culture: NEGATIVE
Acid Fast Culture: NEGATIVE

## 2019-03-14 LAB — ACID FAST CULTURE WITH REFLEXED SENSITIVITIES (MYCOBACTERIA): Acid Fast Culture: NEGATIVE

## 2019-08-08 ENCOUNTER — Other Ambulatory Visit: Payer: Self-pay | Admitting: Cardiothoracic Surgery

## 2019-08-10 ENCOUNTER — Other Ambulatory Visit: Payer: Self-pay | Admitting: Cardiothoracic Surgery

## 2019-08-10 DIAGNOSIS — J869 Pyothorax without fistula: Secondary | ICD-10-CM

## 2019-09-13 ENCOUNTER — Ambulatory Visit: Payer: 59 | Admitting: Cardiothoracic Surgery

## 2019-09-13 ENCOUNTER — Encounter: Payer: Self-pay | Admitting: Cardiothoracic Surgery

## 2019-09-13 ENCOUNTER — Inpatient Hospital Stay: Admission: RE | Admit: 2019-09-13 | Payer: 59 | Source: Ambulatory Visit

## 2019-11-27 ENCOUNTER — Inpatient Hospital Stay: Admission: AD | Admit: 2019-11-27 | Payer: 59 | Source: Other Acute Inpatient Hospital | Admitting: Internal Medicine

## 2019-11-28 DIAGNOSIS — E785 Hyperlipidemia, unspecified: Secondary | ICD-10-CM | POA: Insufficient documentation

## 2019-11-28 DIAGNOSIS — E871 Hypo-osmolality and hyponatremia: Secondary | ICD-10-CM | POA: Insufficient documentation

## 2019-11-28 DIAGNOSIS — R490 Dysphonia: Secondary | ICD-10-CM | POA: Insufficient documentation

## 2019-11-28 DIAGNOSIS — Z720 Tobacco use: Secondary | ICD-10-CM | POA: Insufficient documentation

## 2019-11-28 DIAGNOSIS — R634 Abnormal weight loss: Secondary | ICD-10-CM | POA: Insufficient documentation

## 2019-11-29 DIAGNOSIS — I6502 Occlusion and stenosis of left vertebral artery: Secondary | ICD-10-CM | POA: Insufficient documentation

## 2019-11-30 DIAGNOSIS — T884XXA Failed or difficult intubation, initial encounter: Secondary | ICD-10-CM | POA: Insufficient documentation

## 2019-12-24 ENCOUNTER — Inpatient Hospital Stay (HOSPITAL_COMMUNITY)
Admission: EM | Admit: 2019-12-24 | Discharge: 2020-01-12 | DRG: 025 | Disposition: A | Discharge status: Hospice | Payer: 59 | Attending: Internal Medicine | Admitting: Internal Medicine

## 2019-12-24 ENCOUNTER — Other Ambulatory Visit: Payer: Self-pay

## 2019-12-24 ENCOUNTER — Emergency Department (HOSPITAL_COMMUNITY): Payer: 59

## 2019-12-24 ENCOUNTER — Encounter (HOSPITAL_COMMUNITY): Payer: Self-pay | Admitting: Emergency Medicine

## 2019-12-24 DIAGNOSIS — Z8709 Personal history of other diseases of the respiratory system: Secondary | ICD-10-CM | POA: Diagnosis not present

## 2019-12-24 DIAGNOSIS — G930 Cerebral cysts: Secondary | ICD-10-CM | POA: Diagnosis present

## 2019-12-24 DIAGNOSIS — E119 Type 2 diabetes mellitus without complications: Secondary | ICD-10-CM | POA: Diagnosis not present

## 2019-12-24 DIAGNOSIS — J690 Pneumonitis due to inhalation of food and vomit: Secondary | ICD-10-CM | POA: Diagnosis not present

## 2019-12-24 DIAGNOSIS — I10 Essential (primary) hypertension: Secondary | ICD-10-CM | POA: Diagnosis present

## 2019-12-24 DIAGNOSIS — J984 Other disorders of lung: Secondary | ICD-10-CM

## 2019-12-24 DIAGNOSIS — C3490 Malignant neoplasm of unspecified part of unspecified bronchus or lung: Secondary | ICD-10-CM

## 2019-12-24 DIAGNOSIS — R42 Dizziness and giddiness: Secondary | ICD-10-CM | POA: Diagnosis not present

## 2019-12-24 DIAGNOSIS — R531 Weakness: Secondary | ICD-10-CM | POA: Diagnosis not present

## 2019-12-24 DIAGNOSIS — Z66 Do not resuscitate: Secondary | ICD-10-CM | POA: Diagnosis not present

## 2019-12-24 DIAGNOSIS — R042 Hemoptysis: Secondary | ICD-10-CM | POA: Diagnosis present

## 2019-12-24 DIAGNOSIS — Z79899 Other long term (current) drug therapy: Secondary | ICD-10-CM

## 2019-12-24 DIAGNOSIS — R112 Nausea with vomiting, unspecified: Secondary | ICD-10-CM | POA: Diagnosis not present

## 2019-12-24 DIAGNOSIS — Z79890 Hormone replacement therapy (postmenopausal): Secondary | ICD-10-CM

## 2019-12-24 DIAGNOSIS — D638 Anemia in other chronic diseases classified elsewhere: Secondary | ICD-10-CM | POA: Diagnosis present

## 2019-12-24 DIAGNOSIS — C7931 Secondary malignant neoplasm of brain: Secondary | ICD-10-CM | POA: Diagnosis present

## 2019-12-24 DIAGNOSIS — J9601 Acute respiratory failure with hypoxia: Secondary | ICD-10-CM | POA: Diagnosis not present

## 2019-12-24 DIAGNOSIS — J189 Pneumonia, unspecified organism: Secondary | ICD-10-CM

## 2019-12-24 DIAGNOSIS — R627 Adult failure to thrive: Secondary | ICD-10-CM | POA: Diagnosis present

## 2019-12-24 DIAGNOSIS — D649 Anemia, unspecified: Secondary | ICD-10-CM | POA: Diagnosis present

## 2019-12-24 DIAGNOSIS — G9389 Other specified disorders of brain: Secondary | ICD-10-CM | POA: Diagnosis present

## 2019-12-24 DIAGNOSIS — R590 Localized enlarged lymph nodes: Secondary | ICD-10-CM | POA: Diagnosis present

## 2019-12-24 DIAGNOSIS — G936 Cerebral edema: Secondary | ICD-10-CM | POA: Diagnosis present

## 2019-12-24 DIAGNOSIS — R0602 Shortness of breath: Secondary | ICD-10-CM | POA: Diagnosis not present

## 2019-12-24 DIAGNOSIS — Z5320 Procedure and treatment not carried out because of patient's decision for unspecified reasons: Secondary | ICD-10-CM | POA: Diagnosis not present

## 2019-12-24 DIAGNOSIS — R918 Other nonspecific abnormal finding of lung field: Secondary | ICD-10-CM | POA: Diagnosis not present

## 2019-12-24 DIAGNOSIS — J869 Pyothorax without fistula: Secondary | ICD-10-CM | POA: Diagnosis not present

## 2019-12-24 DIAGNOSIS — H9190 Unspecified hearing loss, unspecified ear: Secondary | ICD-10-CM | POA: Diagnosis present

## 2019-12-24 DIAGNOSIS — H532 Diplopia: Secondary | ICD-10-CM | POA: Diagnosis present

## 2019-12-24 DIAGNOSIS — C3432 Malignant neoplasm of lower lobe, left bronchus or lung: Secondary | ICD-10-CM | POA: Diagnosis present

## 2019-12-24 DIAGNOSIS — R1312 Dysphagia, oropharyngeal phase: Secondary | ICD-10-CM | POA: Diagnosis not present

## 2019-12-24 DIAGNOSIS — E1165 Type 2 diabetes mellitus with hyperglycemia: Secondary | ICD-10-CM | POA: Diagnosis not present

## 2019-12-24 DIAGNOSIS — C799 Secondary malignant neoplasm of unspecified site: Secondary | ICD-10-CM | POA: Diagnosis present

## 2019-12-24 DIAGNOSIS — E039 Hypothyroidism, unspecified: Secondary | ICD-10-CM | POA: Diagnosis present

## 2019-12-24 DIAGNOSIS — Z20822 Contact with and (suspected) exposure to covid-19: Secondary | ICD-10-CM | POA: Diagnosis present

## 2019-12-24 DIAGNOSIS — Z515 Encounter for palliative care: Secondary | ICD-10-CM | POA: Diagnosis not present

## 2019-12-24 DIAGNOSIS — J930 Spontaneous tension pneumothorax: Secondary | ICD-10-CM | POA: Diagnosis not present

## 2019-12-24 DIAGNOSIS — R680 Hypothermia, not associated with low environmental temperature: Secondary | ICD-10-CM | POA: Diagnosis present

## 2019-12-24 DIAGNOSIS — J449 Chronic obstructive pulmonary disease, unspecified: Secondary | ICD-10-CM | POA: Diagnosis present

## 2019-12-24 DIAGNOSIS — E46 Unspecified protein-calorie malnutrition: Secondary | ICD-10-CM | POA: Diagnosis not present

## 2019-12-24 DIAGNOSIS — Z9889 Other specified postprocedural states: Secondary | ICD-10-CM

## 2019-12-24 DIAGNOSIS — F419 Anxiety disorder, unspecified: Secondary | ICD-10-CM | POA: Diagnosis present

## 2019-12-24 DIAGNOSIS — Y95 Nosocomial condition: Secondary | ICD-10-CM | POA: Diagnosis not present

## 2019-12-24 DIAGNOSIS — D72829 Elevated white blood cell count, unspecified: Secondary | ICD-10-CM | POA: Diagnosis not present

## 2019-12-24 DIAGNOSIS — R519 Headache, unspecified: Secondary | ICD-10-CM

## 2019-12-24 DIAGNOSIS — R634 Abnormal weight loss: Secondary | ICD-10-CM | POA: Diagnosis not present

## 2019-12-24 DIAGNOSIS — E43 Unspecified severe protein-calorie malnutrition: Secondary | ICD-10-CM | POA: Diagnosis present

## 2019-12-24 DIAGNOSIS — F17210 Nicotine dependence, cigarettes, uncomplicated: Secondary | ICD-10-CM | POA: Diagnosis present

## 2019-12-24 DIAGNOSIS — Z6821 Body mass index (BMI) 21.0-21.9, adult: Secondary | ICD-10-CM

## 2019-12-24 DIAGNOSIS — F17200 Nicotine dependence, unspecified, uncomplicated: Secondary | ICD-10-CM | POA: Diagnosis not present

## 2019-12-24 DIAGNOSIS — G939 Disorder of brain, unspecified: Secondary | ICD-10-CM | POA: Diagnosis not present

## 2019-12-24 DIAGNOSIS — G8929 Other chronic pain: Secondary | ICD-10-CM

## 2019-12-24 DIAGNOSIS — K089 Disorder of teeth and supporting structures, unspecified: Secondary | ICD-10-CM

## 2019-12-24 DIAGNOSIS — E785 Hyperlipidemia, unspecified: Secondary | ICD-10-CM | POA: Diagnosis present

## 2019-12-24 DIAGNOSIS — R1310 Dysphagia, unspecified: Secondary | ICD-10-CM | POA: Diagnosis present

## 2019-12-24 DIAGNOSIS — T380X5A Adverse effect of glucocorticoids and synthetic analogues, initial encounter: Secondary | ICD-10-CM | POA: Diagnosis present

## 2019-12-24 DIAGNOSIS — R54 Age-related physical debility: Secondary | ICD-10-CM | POA: Diagnosis present

## 2019-12-24 DIAGNOSIS — Z7951 Long term (current) use of inhaled steroids: Secondary | ICD-10-CM

## 2019-12-24 DIAGNOSIS — Z794 Long term (current) use of insulin: Secondary | ICD-10-CM

## 2019-12-24 LAB — CBC WITH DIFFERENTIAL/PLATELET
Abs Immature Granulocytes: 0.09 10*3/uL — ABNORMAL HIGH (ref 0.00–0.07)
Basophils Absolute: 0.1 10*3/uL (ref 0.0–0.1)
Basophils Relative: 0 %
Eosinophils Absolute: 0.1 10*3/uL (ref 0.0–0.5)
Eosinophils Relative: 1 %
HCT: 38.9 % — ABNORMAL LOW (ref 39.0–52.0)
Hemoglobin: 12.3 g/dL — ABNORMAL LOW (ref 13.0–17.0)
Immature Granulocytes: 1 %
Lymphocytes Relative: 18 %
Lymphs Abs: 2.3 10*3/uL (ref 0.7–4.0)
MCH: 26.9 pg (ref 26.0–34.0)
MCHC: 31.6 g/dL (ref 30.0–36.0)
MCV: 84.9 fL (ref 80.0–100.0)
Monocytes Absolute: 1.4 10*3/uL — ABNORMAL HIGH (ref 0.1–1.0)
Monocytes Relative: 11 %
Neutro Abs: 8.7 10*3/uL — ABNORMAL HIGH (ref 1.7–7.7)
Neutrophils Relative %: 69 %
Platelets: 645 10*3/uL — ABNORMAL HIGH (ref 150–400)
RBC: 4.58 MIL/uL (ref 4.22–5.81)
RDW: 14.3 % (ref 11.5–15.5)
WBC: 12.7 10*3/uL — ABNORMAL HIGH (ref 4.0–10.5)
nRBC: 0 % (ref 0.0–0.2)

## 2019-12-24 LAB — RESPIRATORY PANEL BY RT PCR (FLU A&B, COVID)
Influenza A by PCR: NEGATIVE
Influenza B by PCR: NEGATIVE
SARS Coronavirus 2 by RT PCR: NEGATIVE

## 2019-12-24 LAB — BASIC METABOLIC PANEL
Anion gap: 14 (ref 5–15)
BUN: 14 mg/dL (ref 6–20)
CO2: 27 mmol/L (ref 22–32)
Calcium: 9.7 mg/dL (ref 8.9–10.3)
Chloride: 91 mmol/L — ABNORMAL LOW (ref 98–111)
Creatinine, Ser: 0.68 mg/dL (ref 0.61–1.24)
GFR calc Af Amer: >60 mL/min (ref >60)
GFR calc non Af Amer: >60 mL/min (ref >60)
Glucose, Bld: 204 mg/dL — ABNORMAL HIGH (ref 70–99)
Potassium: 4.5 mmol/L (ref 3.5–5.1)
Sodium: 132 mmol/L — ABNORMAL LOW (ref 135–145)

## 2019-12-24 LAB — HEPATIC FUNCTION PANEL
ALT: 21 U/L (ref 0–44)
AST: 15 U/L (ref 15–41)
Albumin: 3 g/dL — ABNORMAL LOW (ref 3.5–5.0)
Alkaline Phosphatase: 471 U/L — ABNORMAL HIGH (ref 38–126)
Bilirubin, Direct: <0.1 mg/dL (ref 0.0–0.2)
Total Bilirubin: 0.7 mg/dL (ref 0.3–1.2)
Total Protein: 8.2 g/dL — ABNORMAL HIGH (ref 6.5–8.1)

## 2019-12-24 MED ORDER — IOHEXOL 350 MG/ML SOLN
100.0000 mL | Freq: Once | INTRAVENOUS | Status: AC | PRN
  Administered 2019-12-24: 19:00:00 100 mL via INTRAVENOUS

## 2019-12-24 MED ORDER — INSULIN ASPART 100 UNIT/ML ~~LOC~~ SOLN
0.0000 [IU] | SUBCUTANEOUS | Status: DC
  Administered 2019-12-25: 3 [IU] via SUBCUTANEOUS
  Administered 2019-12-25: 5 [IU] via SUBCUTANEOUS
  Administered 2019-12-25: 3 [IU] via SUBCUTANEOUS
  Administered 2019-12-25: 2 [IU] via SUBCUTANEOUS
  Administered 2019-12-26 (×2): 3 [IU] via SUBCUTANEOUS

## 2019-12-24 MED ORDER — ONDANSETRON HCL 4 MG/2ML IJ SOLN
4.0000 mg | Freq: Four times a day (QID) | INTRAMUSCULAR | Status: DC | PRN
  Administered 2019-12-25 – 2020-01-08 (×6): 4 mg via INTRAVENOUS
  Filled 2019-12-24 (×6): qty 2

## 2019-12-24 MED ORDER — ACETAMINOPHEN 650 MG RE SUPP
650.0000 mg | Freq: Four times a day (QID) | RECTAL | Status: DC | PRN
  Filled 2019-12-24: qty 1

## 2019-12-24 MED ORDER — SENNOSIDES-DOCUSATE SODIUM 8.6-50 MG PO TABS: 1.0000 | ORAL_TABLET | Freq: Every evening | ORAL | Status: DC | PRN

## 2019-12-24 MED ORDER — ACETAMINOPHEN 325 MG PO TABS
650.0000 mg | ORAL_TABLET | Freq: Four times a day (QID) | ORAL | Status: DC | PRN
  Administered 2020-01-04 – 2020-01-09 (×6): 650 mg via ORAL
  Filled 2019-12-24 (×7): qty 2

## 2019-12-24 MED ORDER — PROCHLORPERAZINE EDISYLATE 10 MG/2ML IJ SOLN
10.0000 mg | Freq: Once | INTRAMUSCULAR | Status: AC
  Administered 2019-12-24: 17:00:00 10 mg via INTRAVENOUS
  Filled 2019-12-24: qty 2

## 2019-12-24 MED ORDER — DIPHENHYDRAMINE HCL 25 MG PO CAPS
25.0000 mg | ORAL_CAPSULE | Freq: Once | ORAL | Status: AC
  Administered 2019-12-24: 25 mg via ORAL
  Filled 2019-12-24: qty 1

## 2019-12-24 MED ORDER — ATORVASTATIN CALCIUM 10 MG PO TABS
20.0000 mg | ORAL_TABLET | Freq: Every day | ORAL | Status: DC
  Administered 2019-12-26 – 2020-01-12 (×17): 20 mg via ORAL
  Filled 2019-12-24 (×18): qty 2

## 2019-12-24 MED ORDER — NICOTINE 14 MG/24HR TD PT24
14.0000 mg | MEDICATED_PATCH | Freq: Every day | TRANSDERMAL | Status: DC
  Filled 2019-12-24 (×15): qty 1

## 2019-12-24 MED ORDER — LOSARTAN POTASSIUM-HCTZ 50-12.5 MG PO TABS: 1.0000 | ORAL_TABLET | Freq: Every day | ORAL | Status: DC

## 2019-12-24 MED ORDER — SODIUM CHLORIDE 0.9 % IV SOLN: INTRAVENOUS | Status: AC

## 2019-12-24 MED ORDER — SODIUM CHLORIDE 0.9% FLUSH
3.0000 mL | Freq: Two times a day (BID) | INTRAVENOUS | Status: DC
  Administered 2019-12-25 – 2020-01-10 (×30): 3 mL via INTRAVENOUS
  Administered 2020-01-10: 10 mL via INTRAVENOUS
  Administered 2020-01-11 – 2020-01-12 (×3): 3 mL via INTRAVENOUS

## 2019-12-24 MED ORDER — ONDANSETRON HCL 4 MG PO TABS: 4.0000 mg | ORAL_TABLET | Freq: Four times a day (QID) | ORAL | Status: DC | PRN

## 2019-12-24 MED ORDER — LEVOTHYROXINE SODIUM 25 MCG PO TABS
25.0000 ug | ORAL_TABLET | Freq: Every day | ORAL | Status: DC
  Administered 2019-12-26 – 2020-01-12 (×17): 25 ug via ORAL
  Filled 2019-12-24 (×17): qty 1

## 2019-12-24 MED ORDER — ENOXAPARIN SODIUM 40 MG/0.4ML ~~LOC~~ SOLN: 40.0000 mg | SUBCUTANEOUS | Status: DC

## 2019-12-24 NOTE — H&P (Signed)
Date: 12/25/2019               Patient Name:  Gary Hudson MRN: [id number]  DOB: Dec 10, 1965 Age / Sex: 54 y.o., male   PCP: Patient, No Pcp Per         Medical Service: Internal Medicine Teaching Service         Attending Physician: Dr. Velna Ochs, MD    First Contact: Dr. Maudie Mercury Pager: [phone]  Second Contact: Dr. Modena Nunnery Pager: [phone]       After Hours (After 5p/  First Contact Pager: [phone]  weekends / holidays): Second Contact Pager: [phone]   Chief Complaint: headache  History of Present Illness:  Gary Hudson is a 54 year old M with significant PMH of loculated left empyema with cavitary lung lesion in the left lower lobe s/p VATS in 01/2019, significant tobacco use, type II diabetes, hypertension, and hypothyroidism. He presents to the ED today for intractable headaches. Of note, he had a recent hospitalization 1 month ago after presenting for hemoptysis and weight loss and was found to have enlargement of his prior cavitary lung lesions with numerous bilateral smaller lesions and right cerebellar cystic mass. Extensive work-up during that admission coordinated between infectious disease, pulmonology, neurology, and neurosurgery was unremarkable. Pt left AMA before additional biopsies could be performed and planned to continue outpatient follow-up. He was seen by his PCP approximately two weeks ago who referred him to outpatient neurology. Still awaiting call from neurosurgery to schedule outpatient follow-up. Was seen by pulmonology on 4/30, who ordered for CT guided FNA and scheduled 4 week follow-up.  Today, pt endorsing continued headaches and dizziness at home. He is lethargic and intermittently sleeping on examination. Overall disengaged from the interview. He arouses to voice and will follow commands. Pt's sister, Gary Hudson, in the room and helps provide the history. She states pt has been sleeping at home since leaving the hospital. Say he has not  been taking PO for last few days. Endorses that this was his baseline when leaving the hospital before.   Pt himself denies fevers, chills, chest pain, palpitations, shortness of breath, nausea/vomiting, abdominal pain, myalgias, or vision changes. Endorses weight loss, fatigue, continued hemoptysis and cough. He is unable to quantify the amount of hemoptysis. States he is able to drink liquids and eat soft foods. Has been experiencing dysphagia to solid foods. Feels as though the food gets stuck in his throat right below the chin. Overall, pt is most concerned about treating his headache.  In the ED, pt was afebrile, HR 92, BP 112/78, RR 22, and O2 saturation 100% on room air. Repeat CT head showed hypodense R cerebellar mass with mass effect and without hydrocephalus or hemorrhage. CTA of the chest without PE and showing known cavitary process similar to prior with new/progressive left upper lobe cavitary lesion and surrounding consolidation. Lab work with mild leukocytosis of 12.7, thrombocytosis to 645, Na 132, K 4.5, bicarb 27, Glc 204, BUN 14, Cr 0.68, alk phos 471, and AST 15/ALT 21. Pt given benadryl and compazine for headache. IMTS called for admission for symptoms management.   Meds: No current facility-administered medications on file prior to encounter.   Current Outpatient Medications on File Prior to Encounter  Medication Sig Dispense Refill  . atorvastatin (LIPITOR) 20 MG tablet Take 20 mg by mouth daily.   1  . gabapentin (NEURONTIN) 300 MG capsule Take 1 capsule (300 mg total) by mouth 3 (three) times daily.  As needed for incisional pain 90 capsule 1  . glimepiride (AMARYL) 4 MG tablet Take 4 mg by mouth daily with breakfast.   0  . levothyroxine (SYNTHROID, LEVOTHROID) 25 MCG tablet Take 25 mcg by mouth daily before breakfast.   1  . losartan-hydrochlorothiazide (HYZAAR) 50-12.5 MG tablet Take 1 tablet by mouth daily.   1  . metFORMIN (GLUCOPHAGE) 500 MG tablet Take 500-1,000 mg by  mouth See admin instructions. 1087m in the morning and 5078min the evening  1  . Vitamin D, Ergocalciferol, (DRISDOL) 50000 units CAPS capsule Take 50,000 Units by mouth every 7 (seven) days. SUNDAYS  0   Allergies: Allergies as of 12/24/2019  . (No Known Allergies)   Past Medical History:  Diagnosis Date  . Diabetes mellitus without complication ([facility])    Family History:  Maternal aunt (non-smoker) and uncle (smoker) with esophageal cancer. Father with heart disease. No family history of lung cancer, two relatives with emphysema.   Social History:  Current every day smoker, 1-2 packs per day for 40 years. Denies EtOH use over last 6 months. Denies marijuana or other substance use. Lives at home with a nephew, who has helped take care of him since his last hospitalization. Performs some ADLs and requires help with medications and IADLs. Worked at a [employer] last 20 years.  Review of Systems: Review of Systems  Constitutional: Positive for malaise/fatigue and weight loss. Negative for chills and fever.  HENT: Negative for congestion, sinus pain and sore throat.   Eyes: Negative for blurred vision and double vision.  Respiratory: Positive for cough and hemoptysis. Negative for sputum production and shortness of breath.   Cardiovascular: Negative for chest pain, palpitations and leg swelling.  Gastrointestinal: Negative for abdominal pain, constipation, diarrhea, nausea and vomiting.  Genitourinary: Negative for dysuria.       Decreased urination  Musculoskeletal: Negative for myalgias.  Skin: Negative.   Neurological: Positive for dizziness and headaches. Negative for sensory change, focal weakness, seizures, loss of consciousness and weakness.   Physical Exam: Blood pressure 123/76, pulse 90, temperature (!) 97.5 F (36.4 C), temperature source Oral, resp. rate 19, height 5' 11"  (1.803 m), weight 74.8 kg, SpO2 95 %. Physical Exam Vitals and nursing note reviewed.   Constitutional:      General: He is sleeping. He is not in acute distress.    Appearance: He is normal weight. He is ill-appearing. He is not toxic-appearing or diaphoretic.     Comments: Pt's sister, PeKieth Brightlypresent.  HENT:     Head: Normocephalic and atraumatic.     Mouth/Throat:     Mouth: Mucous membranes are moist.     Comments: Poor dentition  Eyes:     Extraocular Movements: Extraocular movements intact.     Conjunctiva/sclera: Conjunctivae normal.     Pupils: Pupils are equal, round, and reactive to light.  Cardiovascular:     Rate and Rhythm: Normal rate and regular rhythm.     Heart sounds: Normal heart sounds. No murmur. No friction rub. No gallop.   Pulmonary:     Effort: Pulmonary effort is normal. No respiratory distress.     Comments: On room air. Decreased breath sounds in the left base. Diffuse scattered rhonchi throughout.  Abdominal:     General: Abdomen is flat. Bowel sounds are normal. There is no distension.     Palpations: Abdomen is soft.     Tenderness: There is no abdominal tenderness. There is no guarding or rebound.  Musculoskeletal:        General: No tenderness or deformity.     Right lower leg: No edema.     Left lower leg: No edema.  Skin:    General: Skin is warm and dry.  Neurological:     Mental Status: He is easily aroused. He is lethargic.     Comments: Lethargic, though easily arouses to voice. Follows commands. Oriented to person and place.  Cranial Nerves: II: Pupils equal, round, and reactive to light.  III,IV, VI: EOMI without ptosis or diploplia.  V: Facial sensation is symmetric tolight touch. VII: Facial movement is symmetric.  VIII: Hearing is intact to voice Motor: Good effort thorughout, at least 5/5 bilateral UE, 4/5 bilateral lower extremitiy Cerebellar: Finger-Nose and Heel-Shin intact bilaterally  Psychiatric:        Mood and Affect: Affect is flat.     Comments: Behavior disengaged from interview. Intermittently  cooperates with exam.    EKG: personally reviewed my interpretation is sinus rhythm with premature atrial complexes  CXR: personally reviewed my interpretation is heterogenous opacities of the left lower lobe. No pleural effusion, pneumothorax, or bony abnormality.   Assessment & Plan by Problem: Active Problems:   Cavitary lung disease  Mr. Armijo is a 54 year old M with significant PMH of loculated left empyema with cavitary lung lesion in the left lower lobe s/p VATS in 01/2019, significant tobacco use, type II diabetes, hypertension, hypothyroidism, and a recent hospitalization for cavitary lung lesions and a cerebellar mass of unknown etiology, who presents for treatment of intractable headaches.   Persistent headaches Right cerebellar cystic mass Suspect pt's persistent headaches related to cerebellar mass and associated mass effect. Currently without hydrocephalus or signs of increased intracranial pressure (nausea/vomiting). During last hospitalization, neurosurgery planned for outpatient follow-up however pt had not yet been contacted for appointment scheduling. Today, the ED provider spoke to Dr. Arnoldo Morale of neurosurgery who recommended outpatient follow-up for further pursuit of biopsy vs resection of mass.  - will try decadron 13m q6h PRN for headache, given that worked for pt previously - lethargic on exam but otherwise neurologically intact - difficult to assess pt's mental status given intermittent cooperation with exam  Cavitary lung disease Pt with known pulmonary cavitary lesions of the left lower lobe and numerous cavitary lesions in the bilateral lung. Continues to experience hemoptysis, weight loss, and intermittent dyspnea. Had admission in 01/2019 for empyema and VATS, AFB smear at that time and HIV was negative. During recent admission last month, pt underwent bronchoscopy on 11/30/2019 with endobronchial lesion noted in the LLL with FNA, cytology and pathology inconclusive.  Work-up during that admission including, negative urine strep pneumo antigen, negative quantiferon gold assay, and negative ANA, ACE level, RF, Legionella antigen, and ANCA testing. Overall picture at that time concerning for malignancy (lung/brain lesions, unintentional weight loss, and SIADH) vs disseminated fungal/bacterial infection. Antibiotics were held at that time per ID given pt was afebrile and stable. Pt had outpatient pulm follow-up on 4/30, where CT-guided FNA was ordered but has not yet been completed. - pt with multiple pathologic processes but no clear or unifying etiology (malignancy vs inflammatory vs infectious) - on room air and saturating well with normal respiratory effort - lung ascultation remarkable for decreased breath sounds at the left base - can consider inpatient pulm consult in the AM for potential inpatient biopsy - nicotine patch - continuous pulse ox - PT/OT eval given deconditioning since hospitalization  Dysphagia Pt endorsing dysphagia to  solids and continued weight loss. Per chart review pt is down 19lbs in the last year. CT neck from 11/28/2019 remarkable for "no significant abnormalities in the nasopharynx, oropharynx, and hypopharynx. There is asymmetric prominence of the right laryngeal ventricle and right piriform sinus. The right true vocal cord is slightly more paramedian position within the left. No definite focal nodularity demonstrated." - SLP eval - NPO except sips with meds while pt is lethargic  Type II diabetes - uncontrolled A1c 13.5 on admission. Pt on glimepiride 39m daily and metformin 10077mBID. Pt's sister believed he may have been started on insulin at his last PCP visit two weeks ago because he started doing a daily injection (thinks the dosing is 3030m Will bring in home pen for med rec tomorrow. - SSI  Hypertension BP well controlled on admission 100-140s/60-90s. - continue home losartan 49m62md HCZ 12.5mg 34mly  Hypothyroidism -  continue home levothyroxine 25mcg22mly  Diet - NPO Fluids - NS 100mL/h41mT ppx - SCDs CODE STATUS - FULL CODE  Dispo: Admit patient to Inpatient with expected length of stay greater than 2 midnights.  Signed: Kenyetta Fife, Ladona Horns3/2021, 2:53 AM  Pager: [phone]

## 2019-12-24 NOTE — ED Provider Notes (Signed)
[facility] Provider Note   CSN: [id number] Arrival date & time: 12/24/19  1505     History Chief Complaint  Patient presents with  . Shortness of Breath    Gary Hudson is a 54 y.o. male.  Patient here with headache and shortness of breath.  Recent hospital admission last month at [facility] for the same.  Was noted to have cavitary lesions in the lung and had unremarkable work-up per patient.  They do not understand what happened during her hospital stay.  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  They have not been able to follow-up with any specialist.  There is a plan to biopsy another lung lesion.  Primary care doctor has not been able to arrange for follow-up yet.  Was not happy with care at [facility].  The history is provided by the patient.  Illness Location:  General Severity:  Mild Onset quality:  Gradual Timing:  Constant Progression:  Unchanged Chronicity:  Recurrent Associated symptoms: cough, headaches and shortness of breath   Associated symptoms: no abdominal pain, no chest pain, no ear pain, no fever, no rash, no sore throat and no vomiting        Past Medical History:  Diagnosis Date  . Diabetes mellitus without complication [facility])     Patient Active Problem List   Diagnosis Date Noted  . Poor dentition 01/30/2019  . Status post thoracotomy 01/26/2019  . Sepsis ([place]) 01/24/2019  . Empyema lung ([place]) 01/24/2019  . Uncontrolled type 2 diabetes mellitus with hyperglycemia ([place]) 01/24/2019  . Essential hypertension 01/24/2019    Past Surgical History:  Procedure Laterality Date  . TEE WITHOUT CARDIOVERSION N/A 01/26/2019   Procedure: TRANSESOPHAGEAL ECHOCARDIOGRAM (TEE);  Surgeon: Prescott Gum, Collier Salina, MD;  Location: [facility] OR;  Service: Thoracic;  Laterality: N/A;  . VIDEO ASSISTED THORACOSCOPY (VATS)/EMPYEMA Left 01/26/2019   Procedure: VIDEO ASSISTED THORACOSCOPY (VATS)/DRAINAGE OF  EMPYEMA  AND DECORTICATION;  Surgeon: Ivin Poot, MD;  Location: [place];  Service: Thoracic;  Laterality: Left;  Marland Kitchen VIDEO BRONCHOSCOPY N/A 01/26/2019   Procedure: VIDEO BRONCHOSCOPY;  Surgeon: Prescott Gum, Collier Salina, MD;  Location: [facility] OR;  Service: Thoracic;  Laterality: N/A;       Family History  Problem Relation Age of Onset  . CAD Father   . Diabetes Mellitus II Neg Hx     Social History   Tobacco Use  . Smoking status: Former Research scientist (life sciences)  . Smokeless tobacco: Never Used  Substance Use Topics  . Alcohol use: Never  . Drug use: Never    Home Medications Prior to Admission medications   Medication Sig Start Date End Date Taking? Authorizing Provider  atorvastatin (LIPITOR) 20 MG tablet Take 20 mg by mouth daily.  04/13/17   [provider]  gabapentin (NEURONTIN) 300 MG capsule Take 1 capsule (300 mg total) by mouth 3 (three) times daily. As needed for incisional pain 03/02/19   Prescott Gum, Collier Salina, MD  glimepiride (AMARYL) 4 MG tablet Take 4 mg by mouth daily with breakfast.  04/13/17   [provider]  levothyroxine (SYNTHROID, LEVOTHROID) 25 MCG tablet Take 25 mcg by mouth daily before breakfast.  04/13/17   [provider]  losartan-hydrochlorothiazide (HYZAAR) 50-12.5 MG tablet Take 1 tablet by mouth daily.  04/13/17   [provider]  metFORMIN (GLUCOPHAGE) 500 MG tablet Take 500-1,000 mg by mouth See admin instructions. 1000mg  in the morning  and 500mg  in the evening 04/13/17   [provider]  Vitamin D, Ergocalciferol, (DRISDOL) 50000 units CAPS capsule Take 50,000 Units by mouth every 7 (seven) days. SUNDAYS 03/02/17   [provider]    Allergies    Patient has no known allergies.  Review of Systems   Review of Systems  Constitutional: Negative for chills and fever.  HENT: Negative for ear pain and sore throat.   Eyes: Negative for pain and visual disturbance.  Respiratory: Positive for cough and shortness of breath.    Cardiovascular: Negative for chest pain and palpitations.  Gastrointestinal: Negative for abdominal pain and vomiting.  Genitourinary: Negative for dysuria and hematuria.  Musculoskeletal: Negative for arthralgias and back pain.  Skin: Negative for color change and rash.  Neurological: Positive for headaches. Negative for seizures and syncope.  All other systems reviewed and are negative.   Physical Exam Updated Vital Signs  ED Triage Vitals  Enc Vitals Group     BP 12/24/19 1510 112/78     Pulse Rate 12/24/19 1510 92     Resp 12/24/19 1510 (!) 22     Temp 12/24/19 1510 (!) 97.5 F (36.4 C)     Temp Source 12/24/19 1510 Oral     SpO2 12/24/19 1510 100 %     Weight 12/24/19 1510 165 lb (74.8 kg)     Height 12/24/19 1510 5\' 11"  (1.803 m)     Head Circumference --      Peak Flow --      Pain Score 12/24/19 1522 0     Pain Loc --      Pain Edu? --      Excl. in [place]? --     Physical Exam Vitals and nursing note reviewed.  Constitutional:      General: He is not in acute distress.    Appearance: He is well-developed. He is ill-appearing.  HENT:     Head: Normocephalic and atraumatic.     Mouth/Throat:     Mouth: Mucous membranes are moist.  Eyes:     Conjunctiva/sclera: Conjunctivae normal.     Pupils: Pupils are equal, round, and reactive to light.  Cardiovascular:     Rate and Rhythm: Normal rate and regular rhythm.     Pulses: Normal pulses.     Heart sounds: Normal heart sounds. No murmur.  Pulmonary:     Effort: Pulmonary effort is normal. No tachypnea or respiratory distress.     Breath sounds: Wheezing present. No decreased breath sounds.  Abdominal:     Palpations: Abdomen is soft.     Tenderness: There is no abdominal tenderness.  Musculoskeletal:        General: Normal range of motion.     Cervical back: Normal range of motion and neck supple.     Right lower leg: No edema.     Left lower leg: No edema.  Skin:    General: Skin is warm and dry.      Capillary Refill: Capillary refill takes less than 2 seconds.  Neurological:     General: No focal deficit present.     Mental Status: He is alert and oriented to person, place, and time.     Cranial Nerves: No cranial nerve deficit.     Motor: No weakness.     Comments: 5+ out of 5 strength throughout, normal sensation, no drift  Psychiatric:        Mood and Affect: Mood normal.  ED Results / Procedures / Treatments   Labs (all labs ordered are listed, but only abnormal results are displayed) Labs Reviewed  CBC WITH DIFFERENTIAL/PLATELET - Abnormal; Notable for the following components:      Result Value   WBC 12.7 (*)    Hemoglobin 12.3 (*)    HCT 38.9 (*)    Platelets 645 (*)    Neutro Abs 8.7 (*)    Monocytes Absolute 1.4 (*)    Abs Immature Granulocytes 0.09 (*)    All other components within normal limits  BASIC METABOLIC PANEL - Abnormal; Notable for the following components:   Sodium 132 (*)    Chloride 91 (*)    Glucose, Bld 204 (*)    All other components within normal limits  HEPATIC FUNCTION PANEL - Abnormal; Notable for the following components:   Total Protein 8.2 (*)    Albumin 3.0 (*)    Alkaline Phosphatase 471 (*)    All other components within normal limits  RESPIRATORY PANEL BY RT PCR (FLU A&B, COVID)    EKG None  Radiology DG Chest 2 View  Result Date: 12/24/2019 CLINICAL DATA:  Shortness of breath EXAM: CHEST - 2 VIEW COMPARISON:  Chest radiographs and CT, 11/27/2019 FINDINGS: The heart size and mediastinal contours are within normal limits. Redemonstrated heterogeneous opacity and multiple cavitary lesions of the left lower lobe. The visualized skeletal structures are unremarkable. IMPRESSION: Redemonstrated heterogeneous opacity and multiple cavitary lesions of the left lower lobe, better assessed by prior CT. No new airspace opacity. Electronically Signed   By: Eddie Candle M.D.   On: 12/24/2019 16:03   CT Head Wo Contrast  Result Date:  12/24/2019 CLINICAL DATA:  Headache. Cerebellar mass EXAM: CT HEAD WITHOUT CONTRAST TECHNIQUE: Contiguous axial images were obtained from the base of the skull through the vertex without intravenous contrast. COMPARISON:  None. FINDINGS: Brain: Hypodense mass of the right cerebellar hemisphere measures 4.2 x 3.3 cm. There is mass effect on the fourth ventricle and distal cerebral aqueduct. No hydrocephalus. No hemorrhage or extra-axial collection. Vascular: No hyperdense vessel or unexpected calcification. Skull: Normal Sinuses/Orbits: Clear sinuses and mastoids. Normal orbits. Other: None. IMPRESSION: Hypodense mass of the right cerebellar hemisphere with mass effect on the fourth ventricle and distal cerebral aqueduct. No hydrocephalus or hemorrhage. Electronically Signed   By: Ulyses Jarred M.D.   On: 12/24/2019 20:03   CT Angio Chest PE W and/or Wo Contrast  Result Date: 12/24/2019 CLINICAL DATA:  Shortness of breath. EXAM: CT ANGIOGRAPHY CHEST WITH CONTRAST TECHNIQUE: Multidetector CT imaging of the chest was performed using the standard protocol during bolus administration of intravenous contrast. Multiplanar CT image reconstructions and MIPs were obtained to evaluate the vascular anatomy. CONTRAST:  163mL OMNIPAQUE IOHEXOL 350 MG/ML SOLN COMPARISON:  Radiograph earlier this day. Chest CTA 1 month ago 11/27/2019 at [place]: Cardiovascular: There are no filling defects within the pulmonary arteries to suggest pulmonary embolus. Aortic atherosclerosis. No aneurysm. Heart is normal in size. No pericardial effusion. There are coronary artery calcifications. Mediastinum/Nodes: Shotty mediastinal and hilar adenopathy, with slight progression from last month. For example right hilar node measures 14 mm, previously 12 mm. No esophageal wall thickening. No thyroid nodule. Lungs/Pleura: Multifocal cavitary lesions throughout both lungs, again seen. Dominant left lower lobe cavitary process in the  infrahilar region measures 6 x 3.6 cm, previously 7.6 x 4.8 cm. This abuts the left lower lobe bronchus which is appears occluded. Adjacent peripheral cavitary nodules and consolidation in  the left lower lobe. There is a new dominant cavitary process in the left upper lobe measuring 2.9 x 3.5 cm, new from last month. Surrounding consolidation and ground-glass. Small cavitary nodules are present throughout all lobes of both lungs, otherwise grossly stable from prior. Small left pleural effusion. There is central bronchial thickening diffusely. Upper Abdomen: Ingested material distends the stomach. No acute findings. Musculoskeletal: Sclerosis centered at T7-T8 endplate consistent with Modic endplate changes, also seen on thoracic MRI performed 12/01/2019 at an outside institution. No discrete focal bone lesion. Review of the MIP images confirms the above findings. IMPRESSION: 1. No pulmonary embolus. 2. Known cavitary process throughout the lungs, with dominant left lower lobe cavitary lesion, similar to CT last month. Patient recently underwent bronchoscopy 11/30/2019 at an outside institution. Recommend correlation with prior bronchoscopy results. Imaging findings may be infectious or neoplastic. 3. Additional cavitary lesions throughout both lungs, also seen on prior, majority of which are stable. There is however a new/progressive left upper lobe cavitary lesion that measures 3.5 x 2.9 cm with surrounding consolidation. 4. Slight progression in mediastinal and hilar adenopathy. Electronically Signed   By: Keith Rake M.D.   On: 12/24/2019 19:46    Procedures Procedures (including critical care time)  Medications Ordered in ED Medications  prochlorperazine (COMPAZINE) injection 10 mg (10 mg Intravenous Given 12/24/19 1659)  diphenhydrAMINE (BENADRYL) capsule 25 mg (25 mg Oral Given 12/24/19 1658)  iohexol (OMNIPAQUE) 350 MG/ML injection 100 mL (100 mLs Intravenous Contrast Given 12/24/19 1901)    ED  Course  I have reviewed the triage vital signs and the nursing notes.  Pertinent labs & imaging results that were available during my care of the patient were reviewed by me and considered in my medical decision making (see chart for details).    MDM Rules/Calculators/A&P                      Gary Hudson is a 54 year old male with history of COPD who presents to the ED with shortness of breath and headache.  Patient with normal vitals.  No fever.  Symptoms have been ongoing for weeks.  Upon chart review as patient is poor historian, he had prolonged hospital stay at [facility] last month after being found to have cavitary lesions in his lung.  He tested negative for tuberculosis.  He had a lung biopsy of 1 of these lesions that showed no malignancies and overall inflammatory cells.  Also was found to have cystic lesion in the right cerebellar space and overall was referred to possibly have a biopsy of this at some point.  He has had headaches, shortness of breath intermittently during this time.  He has not been able to follow-up with anybody outpatient.  He has not been able to follow-up with his PCP.  Patient was not happy with his care at [facility] and overall it appears that visit today is to try to figure out what is going on and give patient symptomatic relief as he is having a bad headache today.  Neurologically he is intact.  He has some wheezing on exam.  But otherwise he has normal vitals.  No fever.  Will obtain basic lab work and get new CT scan of head and chest.  CT scan showed no PE.  But showed progressive cavitary lesions.  No significant leukocytosis.  No fever.  No significant anemia, electrolyte abnormality.  Overall differential is wide.  He has had extensive work-up.  It has been  fairly inconclusive.  He no longer wants to see his primary pulmonology team.  I think the plan was to reevaluate a pulmonary nodule with a fine-needle aspiration and check for malignancy.  Could be a chronic  infectious process.  He is a heavy smoker.  Per [facility]'s note they felt pretty confident that this was not TB.  CT scan shows continued mass of the right cerebellar area.  Talk with Dr. Arnoldo Morale with neurosurgery about this.  Patient can follow-up outpatient with him and he will talk about resection and doing pathology on that.  Could be cancerous.  Could be a part of pathology syndrome or disease syndrome related to the cavitary lesions in his chest as well.  Overall patient has multiple pathological things going on but no definitive diagnosis.  Mostly I am concerned about patient's symptomatic issues.  Ongoing headache despite medications.  Still feeling weak.  Therefore, will admit to the internal medicine service for symptomatic care and to see if they can possibly get pulmonology to evaluate the patient to help take over diagnosis of the patient's pulmonary findings.  Patient admitted in stable condition.  This chart was dictated using voice recognition software.  Despite best efforts to proofread,  errors can occur which can change the documentation meaning.    Final Clinical Impression(s) / ED Diagnoses Final diagnoses:  Nonintractable headache, unspecified chronicity pattern, unspecified headache type  SOB (shortness of breath)  Cavitary lesion of lung    Rx / DC Orders ED Discharge Orders    None       Lennice Sites, DO 12/24/19 2210

## 2019-12-24 NOTE — Hospital Course (Addendum)
Admitted 12/24/2019  Allergies: Patient has no known allergies. Pertinent Hx: COPD, cavitary lesions of lung, tobacco use.  54 y.o. male p/w headache & SOB.   * Cavitary lung lesions: SOB and hemoptysis: x weeks, also eight loss. Recent hospitalization at [facility] for same where found to have cavitary lesions in his lung & also brain cystic mass. TB, rheum markers, urine strep Ag negative in [facility]. Status post bronc 11/30/2019 lung bx at [facility]> no malignancies. Left AMA before. Saw pulm 4/30 and planned to do CT-guided FNA o/p. Afebrile. Mild leukocytosis at 12.7.  Afebrile.  Repeated chest CT in ED: new/progressive left upper lobe cavity 3.5 x 2.9 cm. Plus multiple stable lung cavities. VS stable. On room air. Pulm consulted.  *Headache: Likely 2/2 R cerebellar mass found on recent hospitalization. Vocal cord paralysis. Pending o/p neurosurg appointmen. Alert and oriented x3. No neuro deficit. Repeated head Ct today: with mass-effect on fourth ventricle and distal cerebral aqueduct without hydrocephalus or hemorrhage on CT scan.  Will likely need biopsy.   EDP called neurosurgery. Recommended o/p eval.   Meds: Lipitor, gabapentin, Synthroid, losartan-HCTZ, Metformin, vitamin D, glimepiride  Consults: pulm, neurosurgery (curbside) Meds: lipitor, synthroid, losartan-HCTZ, zofran, SSI VTE ppx: SCDs IVF: NS 144m/hx10h Diet: npo  WBC 12.7 Hemoglobin 12.3 Platelets 645<860 ALK P: 471 Sodium 132 Potassium 4.5 Creatinine 0.6

## 2019-12-24 NOTE — ED Triage Notes (Signed)
Pt states his "lungs are congested" and that he has a headache. Symptoms have been present for "a while". Also c/o mild shortness of breath.

## 2019-12-25 ENCOUNTER — Inpatient Hospital Stay (HOSPITAL_COMMUNITY): Payer: 59

## 2019-12-25 DIAGNOSIS — G9389 Other specified disorders of brain: Secondary | ICD-10-CM | POA: Diagnosis not present

## 2019-12-25 DIAGNOSIS — J984 Other disorders of lung: Secondary | ICD-10-CM

## 2019-12-25 DIAGNOSIS — R634 Abnormal weight loss: Secondary | ICD-10-CM | POA: Diagnosis present

## 2019-12-25 DIAGNOSIS — R042 Hemoptysis: Secondary | ICD-10-CM | POA: Diagnosis not present

## 2019-12-25 DIAGNOSIS — F17200 Nicotine dependence, unspecified, uncomplicated: Secondary | ICD-10-CM | POA: Diagnosis not present

## 2019-12-25 DIAGNOSIS — Z8709 Personal history of other diseases of the respiratory system: Secondary | ICD-10-CM

## 2019-12-25 DIAGNOSIS — R1310 Dysphagia, unspecified: Secondary | ICD-10-CM

## 2019-12-25 DIAGNOSIS — E785 Hyperlipidemia, unspecified: Secondary | ICD-10-CM | POA: Diagnosis present

## 2019-12-25 DIAGNOSIS — E039 Hypothyroidism, unspecified: Secondary | ICD-10-CM | POA: Diagnosis present

## 2019-12-25 DIAGNOSIS — D649 Anemia, unspecified: Secondary | ICD-10-CM | POA: Diagnosis present

## 2019-12-25 LAB — COMPREHENSIVE METABOLIC PANEL
ALT: 19 U/L (ref 0–44)
AST: 15 U/L (ref 15–41)
Albumin: 2.7 g/dL — ABNORMAL LOW (ref 3.5–5.0)
Alkaline Phosphatase: 429 U/L — ABNORMAL HIGH (ref 38–126)
Anion gap: 12 (ref 5–15)
BUN: 10 mg/dL (ref 6–20)
CO2: 26 mmol/L (ref 22–32)
Calcium: 9.1 mg/dL (ref 8.9–10.3)
Chloride: 96 mmol/L — ABNORMAL LOW (ref 98–111)
Creatinine, Ser: 0.66 mg/dL (ref 0.61–1.24)
GFR calc Af Amer: >60 mL/min (ref >60)
GFR calc non Af Amer: >60 mL/min (ref >60)
Glucose, Bld: 160 mg/dL — ABNORMAL HIGH (ref 70–99)
Potassium: 4.2 mmol/L (ref 3.5–5.1)
Sodium: 134 mmol/L — ABNORMAL LOW (ref 135–145)
Total Bilirubin: 0.4 mg/dL (ref 0.3–1.2)
Total Protein: 7.1 g/dL (ref 6.5–8.1)

## 2019-12-25 LAB — CBG MONITORING, ED
Glucose-Capillary: 127 mg/dL — ABNORMAL HIGH (ref 70–99)
Glucose-Capillary: 191 mg/dL — ABNORMAL HIGH (ref 70–99)
Glucose-Capillary: 209 mg/dL — ABNORMAL HIGH (ref 70–99)
Glucose-Capillary: 219 mg/dL — ABNORMAL HIGH (ref 70–99)

## 2019-12-25 LAB — CBC
HCT: 35.7 % — ABNORMAL LOW (ref 39.0–52.0)
Hemoglobin: 11.2 g/dL — ABNORMAL LOW (ref 13.0–17.0)
MCH: 26.9 pg (ref 26.0–34.0)
MCHC: 31.4 g/dL (ref 30.0–36.0)
MCV: 85.8 fL (ref 80.0–100.0)
Platelets: 643 10*3/uL — ABNORMAL HIGH (ref 150–400)
RBC: 4.16 MIL/uL — ABNORMAL LOW (ref 4.22–5.81)
RDW: 14.3 % (ref 11.5–15.5)
WBC: 11.1 10*3/uL — ABNORMAL HIGH (ref 4.0–10.5)
nRBC: 0 % (ref 0.0–0.2)

## 2019-12-25 LAB — GLUCOSE, CAPILLARY: Glucose-Capillary: 280 mg/dL — ABNORMAL HIGH (ref 70–99)

## 2019-12-25 LAB — HEMOGLOBIN A1C
Hgb A1c MFr Bld: 13.5 % — ABNORMAL HIGH (ref 4.8–5.6)
Mean Plasma Glucose: 340.75 mg/dL

## 2019-12-25 MED ORDER — HYDROCHLOROTHIAZIDE 12.5 MG PO CAPS
12.5000 mg | ORAL_CAPSULE | Freq: Every day | ORAL | Status: DC
  Administered 2019-12-26 – 2020-01-12 (×16): 12.5 mg via ORAL
  Filled 2019-12-25 (×19): qty 1

## 2019-12-25 MED ORDER — LOSARTAN POTASSIUM 50 MG PO TABS
50.0000 mg | ORAL_TABLET | Freq: Every day | ORAL | Status: DC
  Administered 2019-12-26 – 2020-01-12 (×17): 50 mg via ORAL
  Filled 2019-12-25 (×19): qty 1

## 2019-12-25 MED ORDER — DEXAMETHASONE 4 MG PO TABS
4.0000 mg | ORAL_TABLET | Freq: Four times a day (QID) | ORAL | Status: DC | PRN
  Administered 2019-12-25: 4 mg via ORAL
  Filled 2019-12-25: qty 1

## 2019-12-25 MED ORDER — HYDROMORPHONE HCL 1 MG/ML IJ SOLN
0.5000 mg | INTRAMUSCULAR | Status: DC | PRN
  Administered 2019-12-25 – 2019-12-26 (×4): 0.5 mg via INTRAVENOUS
  Filled 2019-12-25 (×4): qty 1

## 2019-12-25 MED ORDER — DEXAMETHASONE 4 MG PO TABS
4.0000 mg | ORAL_TABLET | Freq: Four times a day (QID) | ORAL | Status: DC
  Administered 2019-12-25 – 2020-01-05 (×39): 4 mg via ORAL
  Filled 2019-12-25 (×42): qty 1

## 2019-12-25 NOTE — Progress Notes (Signed)
Subjective:  [name] was seen and evaluated at bedside on morning rounds. He is still having a headache located in the frontal region. Decadron did not help last night. He has not been able to get around at home for several weeks.   Objective:  Vital signs in last 24 hours: Vitals:   12/25/19 0445 12/25/19 0500 12/25/19 0515 12/25/19 0530  BP: 127/75 132/67 114/64 127/75  Pulse: 84 87  93  Resp:      Temp:      TempSrc:      SpO2: 95% 97%  98%  Weight:      Height:       Physical Exam Vitals and nursing note reviewed.  Constitutional:      General: He is not in acute distress.    Appearance: Normal appearance. He is not ill-appearing or toxic-appearing.  HENT:     Head: Normocephalic and atraumatic.  Eyes:     General:        Right eye: No discharge.        Left eye: No discharge.     Conjunctiva/sclera: Conjunctivae normal.  Cardiovascular:     Rate and Rhythm: Normal rate and regular rhythm.     Pulses: Normal pulses.     Heart sounds: Normal heart sounds. No friction rub. No gallop.      Comments: Systolic murmur  Pulmonary:     Effort: Pulmonary effort is normal.     Breath sounds: No wheezing, rhonchi or rales.     Comments: Decreased breath sounds in the LLL Abdominal:     General: Bowel sounds are normal.     Palpations: Abdomen is soft.     Tenderness: There is no abdominal tenderness. There is no guarding.  Musculoskeletal:        General: No swelling. Normal range of motion.     Right lower leg: No edema.     Left lower leg: No edema.     Comments: Strength 5/5 in the upper and lower extremities bilaterally.   Skin:    General: Skin is warm.  Neurological:     General: No focal deficit present.     Mental Status: He is alert and oriented to person, place, and time.     Comments: CN II-XII intact bilaterally Finger to nose intact  Psychiatric:        Mood and Affect: Mood normal.        Behavior: Behavior normal.     CBC Latest Ref Rng &  Units 12/25/2019 12/24/2019 01/30/2019  WBC 4.0 - 10.5 K/uL 11.1(H) 12.7(H) 10.0  Hemoglobin 13.0 - 17.0 g/dL 11.2(L) 12.3(L) 10.3(L)  Hematocrit 39.0 - 52.0 % 35.7(L) 38.9(L) 32.2(L)  Platelets 150 - 400 K/uL 643(H) 645(H) 860(H)   CMP Latest Ref Rng & Units 12/25/2019 12/24/2019 01/30/2019  Glucose 70 - 99 mg/dL 160(H) 204(H) 160(H)  BUN 6 - 20 mg/dL 10 14 <5(L)  Creatinine 0.61 - 1.24 mg/dL 0.66 0.68 0.59(L)  Sodium 135 - 145 mmol/L 134(L) 132(L) 134(L)  Potassium 3.5 - 5.1 mmol/L 4.2 4.5 4.3  Chloride 98 - 111 mmol/L 96(L) 91(L) 100  CO2 22 - 32 mmol/L 26 27 27   Calcium 8.9 - 10.3 mg/dL 9.1 9.7 8.9  Total Protein 6.5 - 8.1 g/dL 7.1 8.2(H) -  Total Bilirubin 0.3 - 1.2 mg/dL 0.4 0.7 -  Alkaline Phos 38 - 126 U/L 429(H) 471(H) -  AST 15 - 41 U/L 15 15 -  ALT 0 -  44 U/L 19 21 -    Assessment/Plan:  Active Problems:   Cavitary lung disease  Persistent Headaches Right cerebellar Cystic Mass: Likely 2/2 to persistent headaches related to cerebellar mass with mass effect.   - Neurosurgery recommends outpatient follow up.   - will try decadron 4mg  q6h PRN  - Dilaudid 0.5mg  Q4H in addition to decadron for headache.  Cavitary lung disease Pt with known pulmonary cavitary lesions of the left lower lobe and numerous cavitary lesions in the bilateral lung. Current every day smoker. Continues to experience hemoptysis, weight loss, and intermittent dyspnea. Overall picture at that time concerning for malignancy vs disseminated fungal/bacterial infection. Pt had outpatient pulm follow-up on 4/30, where CT-guided FNA was ordered but has not yet been completed. - pt with multiple pathologic processes but no clear or unifying etiology (malignancy vs infectious)  - Pulmonary consulted, we appreciate their recommendations.  - Infectious Disease consulted, we appreciate their recommendations.  - Continue Nicotine patch. - PT/OT eval given deconditioning since hospitalization - TTE complete ordered given  murmur with possible concern for infectious process and new murmur.  Dysphagia:  Patient still endorses intermittent dysphagia to solids. Handles liquids well.   - SLP evaluation  - NPO except sips with meds while pt is lethargic. Will place diet after SLP.   Type II diabetes - uncontrolled A1c 13.5 on admission. Pt on glimepiride 4mg  daily and metformin 1000mg  BID at home.  - SSI  Hypertension - Continue Losartan 50mg  QD - Continue HCZ 12.5mg  QD  Hypothyroidism - continue home levothyroxine 91mcg daily  Prior to Admission Living Arrangement: Home Anticipated Discharge Location: Home Barriers to Discharge: Continued Medical workup Dispo: Anticipated discharge in approximately 2-3 days   Maudie Mercury, MD 12/25/2019, 6:45 AM Pager: [phone]

## 2019-12-25 NOTE — ED Notes (Signed)
Pt c/o HA, refused rectal Tylenol and pt remains NPO. Will notify MD

## 2019-12-25 NOTE — Consult Note (Signed)
[facility]    Date of Admission:  12/24/2019          Reason for Consult: Cavitary lung disease, cerebellar mass and failure to thrive    Referring Provider: Dr. Ladona Horns  Assessment: He has a year-long illness with failure to thrive, unintentional weight loss cavitary lung lesion and a right cerebellar mass of unknown etiology.  He tells me that he left [facility] because he did not feel that there knew what they were doing.  He says that he wants to stay here and find out what is causing him to not feel well.  I see no indication to start empiric antibiotics therapy.  Do not believe that he needs inpatient neurosurgical evaluation of his right cerebellar mass and further evaluation of ptosis and cavitary lung disease.  We will follow with you.  Plan: 1. Recommend continued observation of antibiotics 2. Agree with repeat bronchoscopy 3. Recommend inpatient neurosurgical evaluation  Active Problems:   Cavitary lung disease   Cerebellar mass   Unintentional weight loss   Hemoptysis   Dysphagia   Uncontrolled type 2 diabetes mellitus with hyperglycemia (HCC)   Essential hypertension   Status post thoracotomy   Poor dentition   History of pleural effusion   Dyslipidemia   Hypothyroid   Normocytic anemia   Scheduled Meds: . atorvastatin  20 mg Oral Daily  . losartan  50 mg Oral Daily   And  . hydrochlorothiazide  12.5 mg Oral Daily  . insulin aspart  0-9 Units Subcutaneous Q4H  . levothyroxine  25 mcg Oral Q0600  . nicotine  14 mg Transdermal Daily  . sodium chloride flush  3 mL Intravenous Q12H   Continuous Infusions: PRN Meds:.acetaminophen **OR** acetaminophen, dexamethasone, HYDROmorphone (DILAUDID) injection, ondansetron **OR** ondansetron (ZOFRAN) IV, senna-docusate  HPI: Gary Hudson is a 53 y.o. male admitted here in June of last year with a 1 month history progressive, shortness of breath chest pain.  Left pleural effusion and  cavitary lung lesions.  Bronchoscopy and VATS drainage of his effusion which was purulent.  All stains and routine, AFB and fungal cultures were negative.  TEE did not reveal any vegetations.  He was discharged on 2 weeks of oral amoxicillin clavulanate and doxycycline.  He never fully recovered.  He was hospitalized last month at [facility] with unintentional weight loss, hemoptysis and headaches.  His left lower lobe cavity had increased and there were now numerous bilateral small cavities.  He was also found to have a right cerebellar mass.  He underwent bronchoscopy.  Routine cultures grew normal flora.  Biopsy was negative for AFB and fungus.  There was no evidence of malignancy.  He is HIV negative.  He was seen by neurosurgery who did not want to perform lumbar puncture risk of herniation.  Notes indicate there was concern for tuberculosis.  A PPD skin test and QuantiFERON TB Gold assay were both negative.  He left AGAINST MEDICAL ADVICE after 1 week.  He presented here yesterday because of persistent headache, mild hemoptysis and weight loss.  He says that he recently developed dysphagia with solid food.  He has been afebrile.  He has no history of fever, chills or sweats at home.  Repeat CT of chest and head the previously known cavitary lung lesions and right cerebellar mass.  He is a cigarette smoker.  He was born in [place] and moved to [place] 40 years ago.  He has never traveled abroad.  He has diabetes, hypertension and dyslipidemia.  He used to drive a forklift.   Review of Systems: Review of Systems  Constitutional: Positive for malaise/fatigue and weight loss. Negative for chills, diaphoresis and fever.       He says that he has lost about 100 pounds.  Eyes: Negative for blurred vision.  Respiratory: Positive for cough, hemoptysis and shortness of breath.   Cardiovascular: Negative for chest pain.  Gastrointestinal: Negative for abdominal pain.  Neurological: Positive for  headaches.    Past Medical History:  Diagnosis Date  . Diabetes mellitus without complication ([place])     Social History   Tobacco Use  . Smoking status: Former Research scientist (life sciences)  . Smokeless tobacco: Never Used  Substance Use Topics  . Alcohol use: Never  . Drug use: Never    Family History  Problem Relation Age of Onset  . CAD Father   . Diabetes Mellitus II Neg Hx    No Known Allergies  OBJECTIVE: Blood pressure 127/75, pulse 93, temperature (!) 97.5 F (36.4 C), temperature source Oral, resp. rate 19, height 5\' 11"  (1.803 m), weight 74.8 kg, SpO2 98 %.  Physical Exam Constitutional:      Comments: He is resting quietly on a stretcher in the emergency department.  He appears uncomfortable due to headache.  Cardiovascular:     Rate and Rhythm: Normal rate and regular rhythm.     Heart sounds: No murmur.  Pulmonary:     Effort: Pulmonary effort is normal.     Breath sounds: Wheezing present. No rhonchi or rales.  Abdominal:     Palpations: Abdomen is soft. There is no mass.     Tenderness: There is no abdominal tenderness.  Musculoskeletal:        General: No swelling or tenderness.     Cervical back: Neck supple.     Right lower leg: No edema.     Left lower leg: No edema.  Lymphadenopathy:     Head:     Right side of head: No submandibular adenopathy.     Left side of head: No submandibular adenopathy.     Cervical:     Right cervical: No superficial cervical adenopathy.    Left cervical: No superficial cervical adenopathy.     Upper Body:     Right upper body: No supraclavicular, axillary or epitrochlear adenopathy.     Left upper body: No supraclavicular, axillary or epitrochlear adenopathy.  Skin:    Findings: No rash.  Neurological:     General: No focal deficit present.  Psychiatric:        Mood and Affect: Mood normal.     Lab Results Lab Results  Component Value Date   WBC 11.1 (H) 12/25/2019   HGB 11.2 (L) 12/25/2019   HCT 35.7 (L) 12/25/2019   MCV  85.8 12/25/2019   PLT 643 (H) 12/25/2019    Lab Results  Component Value Date   CREATININE 0.66 12/25/2019   BUN 10 12/25/2019   NA 134 (L) 12/25/2019   K 4.2 12/25/2019   CL 96 (L) 12/25/2019   CO2 26 12/25/2019    Lab Results  Component Value Date   ALT 19 12/25/2019   AST 15 12/25/2019   ALKPHOS 429 (H) 12/25/2019   BILITOT 0.4 12/25/2019     Microbiology: Recent Results (from the past 240 hour(s))  Respiratory Panel by RT PCR (Flu A&B, Covid) - Nasopharyngeal Swab     Status: None  Collection Time: 12/24/19  4:57 PM   Specimen: Nasopharyngeal Swab  Result Value Ref Range Status   SARS Coronavirus 2 by RT PCR NEGATIVE NEGATIVE Final    Comment: (NOTE) SARS-CoV-2 target nucleic acids are NOT DETECTED. The SARS-CoV-2 RNA is generally detectable in upper respiratoy specimens during the acute phase of infection. The lowest concentration of SARS-CoV-2 viral copies this assay can detect is 131 copies/mL. A negative result does not preclude SARS-Cov-2 infection and should not be used as the sole basis for treatment or other patient management decisions. A negative result may occur with  improper specimen collection/handling, submission of specimen other than nasopharyngeal swab, presence of viral mutation(s) within the areas targeted by this assay, and inadequate number of viral copies (<131 copies/mL). A negative result must be combined with clinical observations, patient history, and epidemiological information. The expected result is Negative. Fact Sheet for Patients:  PinkCheek.be Fact Sheet for Healthcare Providers:  GravelBags.it This test is not yet ap proved or cleared by the [place] FDA and  has been authorized for detection and/or diagnosis of SARS-CoV-2 by FDA under an Emergency Use Authorization (EUA). This EUA will remain  in effect (meaning this test can be used) for the duration of the COVID-19  declaration under Section 564(b)(1) of the Act, 21 U.S.C. section 360bbb-3(b)(1), unless the authorization is terminated or revoked sooner.    Influenza A by PCR NEGATIVE NEGATIVE Final   Influenza B by PCR NEGATIVE NEGATIVE Final    Comment: (NOTE) The Xpert Xpress SARS-CoV-2/FLU/RSV assay is intended as an aid in  the diagnosis of influenza from Nasopharyngeal swab specimens and  should not be used as a sole basis for treatment. Nasal washings and  aspirates are unacceptable for Xpert Xpress SARS-CoV-2/FLU/RSV  testing. Fact Sheet for Patients: PinkCheek.be Fact Sheet for Healthcare Providers: GravelBags.it This test is not yet approved or cleared by the [place] FDA and  has been authorized for detection and/or diagnosis of SARS-CoV-2 by  FDA under an Emergency Use Authorization (EUA). This EUA will remain  in effect (meaning this test can be used) for the duration of the  Covid-19 declaration under Section 564(b)(1) of the Act, 21  U.S.C. section 360bbb-3(b)(1), unless the authorization is  terminated or revoked. Performed at [facility], [place] [address], [place], [place] [address]     [name], [facility] [phone] pager   [phone] cell 12/25/2019, 10:53 AM

## 2019-12-25 NOTE — Consult Note (Signed)
NAME:  Gary Hudson, MRN:  [id number], DOB:  10-08-1965, LOS: 1 ADMISSION DATE:  12/24/2019, CONSULTATION DATE:  5/3 REFERRING MD:  Dr. Neena Rhymes, CHIEF COMPLAINT:  Cavitary lesions.   Brief History   54 year old male with recent DX cavitary pulmonary lesions, now presenting with headache and found to have cerebellar mass.   History of present illness   54 year old male with PMH as below, which is significant for DM and empyema s/p VATs in 2020. He has at least a 60 pack year smoking history. More recently he was admitted to [facility] 4/6 with complaints of hemoptysis, headache, and unintentional weight loss and was found to have bilateral pulmonary cavitations There was also concern for vocal cord paralysis on CT neck, which rased concern for TB. TB was ruled out. Pulmonary was consulted for lesions. He underwent EBUS, from which pathology was negative for malignancy. He underwent additional bronchoscopy of LLL endobronchial lesion, FNA was also negative on pathology.  CT of the head demonstrated R cerebellar mass with vasogenic edema. He was started on decadron and his headaches improved nicely. Neurosurgery was consulted and were suspicious of CNS TB infection. ID was following and felt as though TB was ruled out. ACE, ANA, RF, legionella antigen, and ANCA testing all negative. He was scheduled for CT guided lung biopsy planned for 4/13, but he left AMA before this could be done.  He was set up for outpatient follow up with neurosurgery and pulmonary.   He presented to [facility] ED 5/2 with complaints of ongoing headache and shortness of breath. CT of the chest was repeated and cavitary disease was found to be progressive. CT of the head unchanged. Neurosurgery contacted in ED and recommended outpatient follow up.    Past Medical History   has a past medical history of Diabetes mellitus without complication ([place]).   Significant Hospital Events   4/6 admit to [facility] for hemoptysis/headache.  Found toh have innumerable pulmonary cavitations and a R cerebellar mass.  5/3 admit to [facility] for same.   Consults:  Pulm  Procedures:    Significant Diagnostic Tests:  CT head 5/2 > Hypodense mass of the right cerebellar hemisphere with mass effect on the fourth ventricle and distal cerebral aqueduct. No hydrocephalus or hemorrhage CTA chest 5/2 >No PE, Known cavitary process throughout the lungs, with dominant left lower lobe cavitary lesion, similar to CT last month. Additional cavitary lesions throughout both lungs, also seen on prior, majority of which are stable. There is however a new/progressive left upper lobe cavitary lesion that measures 3.5 x 2.9 cm with surrounding consolidation. Slight progression in mediastinal and hilar adenopathy.  Micro Data:  COVID-19 5/2 neg  Antimicrobials:    Interim history/subjective:    Objective   Blood pressure 127/75, pulse 93, temperature (!) 97.5 F (36.4 C), temperature source Oral, resp. rate 19, height 5\' 11"  (1.803 m), weight 74.8 kg, SpO2 98 %.        Intake/Output Summary (Last 24 hours) at 12/25/2019 0947 Last data filed at 12/25/2019 0419 Gross per 24 hour  Intake -  Output 750 ml  Net -750 ml   Filed Weights   12/24/19 1510  Weight: 74.8 kg    Examination: General: Frail middle aged male in NAD HENT: Marissa/AT, PERRL, no JVD. Poor dentition.  Lungs: Clear bilateral breath sounds Cardiovascular: RRR, no MRG Abdomen: Soft, non-tender, non-distended Extremities: No acute deformity or ROM limitation.  Neuro: Somnolent, but will arouse and answer  questions for the most part.   Resolved Hospital Problem list     Assessment & Plan:   Pulmonary cavitary lesions: bilateral innumerable lesions. Most notable in the LLL. Underwent EBUS and bronchoscopy at [facility] last month, both of which were negative. TB ruled out by ID during that admission and there was not felt to be an infectious etiology. ANA, ACE, RF, and ANCA all  negative. Etiology unclear, but most concerning culprit is malignancy.   Plan:  Dr Shearon Stalls has discussed his case with Dr. Kipp Brood of thoracic surgery and he is not felt to be a great surgical candidate due to location of cavitations and overall level of pulmonary debility. We will plan for bronchoscopy on 5/4.  Not a good candidate for CT guided biopsy. Please arrange for him to be NPO after midnight.   Labs   CBC: Recent Labs  Lab 12/24/19 1657 12/25/19 0433  WBC 12.7* 11.1*  NEUTROABS 8.7*  --   HGB 12.3* 11.2*  HCT 38.9* 35.7*  MCV 84.9 85.8  PLT 645* 643*    Basic Metabolic Panel: Recent Labs  Lab 12/24/19 1657 12/25/19 0433  NA 132* 134*  K 4.5 4.2  CL 91* 96*  CO2 27 26  GLUCOSE 204* 160*  BUN 14 10  CREATININE 0.68 0.66  CALCIUM 9.7 9.1   GFR: Estimated Creatinine Clearance: 110.4 mL/min (by C-G formula based on SCr of 0.66 mg/dL). Recent Labs  Lab 12/24/19 1657 12/25/19 0433  WBC 12.7* 11.1*    Liver Function Tests: Recent Labs  Lab 12/24/19 1657 12/25/19 0433  AST 15 15  ALT 21 19  ALKPHOS 471* 429*  BILITOT 0.7 0.4  PROT 8.2* 7.1  ALBUMIN 3.0* 2.7*   No results for input(s): LIPASE, AMYLASE in the last 168 hours. No results for input(s): AMMONIA in the last 168 hours.  ABG    Component Value Date/Time   PHART 7.434 01/27/2019 0350   PCO2ART 43.0 01/27/2019 0350   PO2ART 78.7 (L) 01/27/2019 0350   HCO3 28.3 (H) 01/27/2019 0350   TCO2 31 01/26/2019 1002   O2SAT 95.7 01/27/2019 0350     Coagulation Profile: No results for input(s): INR, PROTIME in the last 168 hours.  Cardiac Enzymes: No results for input(s): CKTOTAL, CKMB, CKMBINDEX, TROPONINI in the last 168 hours.  HbA1C: Hgb A1c MFr Bld  Date/Time Value Ref Range Status  12/24/2019 11:55 PM 13.5 (H) 4.8 - 5.6 % Final    Comment:    (NOTE) Pre diabetes:          5.7%-6.4% Diabetes:              >6.4% Glycemic control for   <7.0% adults with diabetes   01/25/2019 04:03 AM  11.4 (H) 4.8 - 5.6 % Final    Comment:    (NOTE) Pre diabetes:          5.7%-6.4% Diabetes:              >6.4% Glycemic control for   <7.0% adults with diabetes     CBG: Recent Labs  Lab 12/25/19 0012 12/25/19 0417 12/25/19 0831  GLUCAP 219* 127* 209*    Review of Systems:   Bolds are positive  Constitutional: weight loss, gain, night sweats, Fevers, chills, fatigue .  HEENT: headaches, Sore throat, sneezing, nasal congestion, post nasal drip, Difficulty swallowing, Tooth/dental problems, visual complaints visual changes, ear ache CV:  chest pain, radiates:,Orthopnea, PND, swelling in lower extremities, dizziness, palpitations, syncope.  GI  heartburn,  indigestion, abdominal pain, nausea, vomiting, diarrhea, change in bowel habits, loss of appetite, bloody stools.  Resp: cough, productive:, hemoptysis, dyspnea, chest pain, pleuritic.  Skin: rash or itching or icterus GU: dysuria, change in color of urine, urgency or frequency. flank pain, hematuria  MS: joint pain or swelling. decreased range of motion  Psych: change in mood or affect. depression or anxiety.  Neuro: difficulty with speech, weakness, numbness, ataxia    Past Medical History  He,  has a past medical history of Diabetes mellitus without complication ([place]).   Surgical History    Past Surgical History:  Procedure Laterality Date  . TEE WITHOUT CARDIOVERSION N/A 01/26/2019   Procedure: TRANSESOPHAGEAL ECHOCARDIOGRAM (TEE);  Surgeon: Prescott Gum, Collier Salina, MD;  Location: [facility] OR;  Service: Thoracic;  Laterality: N/A;  . VIDEO ASSISTED THORACOSCOPY (VATS)/EMPYEMA Left 01/26/2019   Procedure: VIDEO ASSISTED THORACOSCOPY (VATS)/DRAINAGE OF EMPYEMA  AND DECORTICATION;  Surgeon: Ivin Poot, MD;  Location: [facility];  Service: Thoracic;  Laterality: Left;  Marland Kitchen VIDEO BRONCHOSCOPY N/A 01/26/2019   Procedure: VIDEO BRONCHOSCOPY;  Surgeon: Prescott Gum, Collier Salina, MD;  Location: [facility];  Service: Thoracic;  Laterality: N/A;     Social  History   reports that he has quit smoking. He has never used smokeless tobacco. He reports that he does not drink alcohol or use drugs.   Family History   His family history includes CAD in his father. There is no history of Diabetes Mellitus II.   Allergies No Known Allergies   Home Medications  Prior to Admission medications   Medication Sig Start Date End Date Taking? Authorizing Provider  Aspirin-Acetaminophen-Caffeine (GOODY HEADACHE PO) Take 1 Package by mouth every 4 (four) hours as needed (headache).   Yes [provider]  atorvastatin (LIPITOR) 20 MG tablet Take 20 mg by mouth daily.  04/13/17  Yes [provider]  gabapentin (NEURONTIN) 300 MG capsule Take 1 capsule (300 mg total) by mouth 3 (three) times daily. As needed for incisional pain 03/02/19  Yes Prescott Gum, Collier Salina, MD  glimepiride (AMARYL) 4 MG tablet Take 4 mg by mouth daily with breakfast.  04/13/17  Yes [provider]  hydrochlorothiazide (MICROZIDE) 12.5 MG capsule Take 12.5 mg by mouth daily. 11/02/19  Yes [provider]  LANTUS SOLOSTAR 100 UNIT/ML Solostar Pen Inject 30 Units into the skin daily.  12/11/19  Yes [provider]  levothyroxine (SYNTHROID, LEVOTHROID) 25 MCG tablet Take 25 mcg by mouth daily before breakfast.  04/13/17  Yes [provider]  metFORMIN (GLUCOPHAGE) 500 MG tablet Take 500-1,000 mg by mouth See admin instructions. 1000mg  in the morning and 500mg  in the evening 04/13/17  Yes [provider]  traZODone (DESYREL) 50 MG tablet Take 50 mg by mouth at bedtime. 09/22/19  Yes [provider]  VENTOLIN HFA 108 (90 Base) MCG/ACT inhaler Inhale 2 puffs into the lungs every 6 (six) hours as needed for wheezing or shortness of breath. 12/11/19  Yes [provider]  losartan-hydrochlorothiazide (HYZAAR) 50-12.5 MG tablet Take 1 tablet by mouth daily.  04/13/17   [provider]     Georgann Housekeeper, AGACNP-BC [place]  See Amion for personal pager PCCM on call pager [phone]  12/25/2019 10:33 AM

## 2019-12-25 NOTE — ED Notes (Signed)
Pulmonology at bedside for consult.

## 2019-12-25 NOTE — Progress Notes (Addendum)
Inpatient Diabetes Program Recommendations  AACE/ADA: New Consensus Statement on Inpatient Glycemic Control (2015)  Target Ranges:  Prepandial:   less than 140 mg/dL      Peak postprandial:   less than 180 mg/dL (1-2 hours)      Critically ill patients:  140 - 180 mg/dL   Lab Results  Component Value Date   GLUCAP 209 (H) 12/25/2019   HGBA1C 13.5 (H) 12/24/2019    Review of Glycemic Control  Diabetes history: DM 2 Outpatient Diabetes medications: Glimepiride 4 mg Daily, Metformin 1000 mg qam, 500 mg qpm, possibly basal insulin Current orders for Inpatient glycemic control:  Novolog 0-9 units Q4 hours  Decadron 4 mg given at 0046 am.  A1c 14.4% on 4/6 received decadron during the [facility] admission as well. Left AMA A1c 13.5% this admission  Inpatient Diabetes Program Recommendations:    Consider Levemir 10 units.  Will see pt this admission.  Thanks,  Tama Headings RN, MSN, BC-ADM Inpatient Diabetes Coordinator Team Pager [phone] (8a-5p)

## 2019-12-25 NOTE — ED Notes (Signed)
This RN requested On-Call Provider for different pain medication for pts 10/10 Headache.

## 2019-12-25 NOTE — ED Notes (Signed)
Communication with Dr. Koleen Distance about patients diet. Pt completed swallow screen with this RN without complications. Diet order to be placed by attending.   Diet Sprite given to the patient.

## 2019-12-25 NOTE — Anesthesia Preprocedure Evaluation (Addendum)
Anesthesia Evaluation  Patient identified by MRN, date of birth, ID band Patient awake    Reviewed: Allergy & Precautions, Patient's Chart, lab work & pertinent test results  History of Anesthesia Complications Negative for: history of anesthetic complications  Airway Mallampati: II  TM Distance: >3 FB Neck ROM: Full    Dental  (+) Dental Advisory Given, Loose, Poor Dentition,    Pulmonary pneumonia, unresolved, COPD, Patient abstained from smoking., former smoker,     + decreased breath sounds      Cardiovascular hypertension, Pt. on medications  Rhythm:Regular Rate:Normal     Neuro/Psych negative neurological ROS  negative psych ROS   GI/Hepatic negative GI ROS, Neg liver ROS,   Endo/Other  diabetes, Poorly Controlled, Type 2, Oral Hypoglycemic AgentsHypothyroidism   Renal/GU negative Renal ROS     Musculoskeletal negative musculoskeletal ROS (+)   Abdominal   Peds  Hematology  (+) anemia ,  Thrombocytosis    Anesthesia Other Findings   Reproductive/Obstetrics                            Anesthesia Physical  Anesthesia Plan  ASA: III  Anesthesia Plan: General   Post-op Pain Management:    Induction: Intravenous  PONV Risk Score and Plan: 3 and Treatment may vary due to age or medical condition, Ondansetron, Dexamethasone and Midazolam  Airway Management Planned: Oral ETT and LMA  Additional Equipment:   Intra-op Plan:   Post-operative Plan: Extubation in OR  Informed Consent: I have reviewed the patients History and Physical, chart, labs and discussed the procedure including the risks, benefits and alternatives for the proposed anesthesia with the patient or authorized representative who has indicated his/her understanding and acceptance.     Dental advisory given  Plan Discussed with: Anesthesiologist and CRNA  Anesthesia Plan Comments:       Anesthesia Quick  Evaluation

## 2019-12-26 ENCOUNTER — Inpatient Hospital Stay (HOSPITAL_COMMUNITY): Payer: 59

## 2019-12-26 ENCOUNTER — Inpatient Hospital Stay (HOSPITAL_COMMUNITY): Payer: 59 | Admitting: Anesthesiology

## 2019-12-26 ENCOUNTER — Encounter (HOSPITAL_COMMUNITY): Payer: Self-pay | Admitting: Internal Medicine

## 2019-12-26 ENCOUNTER — Encounter (HOSPITAL_COMMUNITY)
Admission: EM | Disposition: A | Discharge status: Hospice | Payer: Self-pay | Source: Home / Self Care | Attending: Internal Medicine

## 2019-12-26 DIAGNOSIS — R0602 Shortness of breath: Secondary | ICD-10-CM | POA: Diagnosis not present

## 2019-12-26 DIAGNOSIS — J984 Other disorders of lung: Secondary | ICD-10-CM | POA: Diagnosis not present

## 2019-12-26 HISTORY — PX: BRONCHIAL BRUSHINGS: SHX5108

## 2019-12-26 HISTORY — PX: BRONCHIAL WASHINGS: SHX5105

## 2019-12-26 HISTORY — PX: VIDEO BRONCHOSCOPY: SHX5072

## 2019-12-26 LAB — BODY FLUID CELL COUNT WITH DIFFERENTIAL
Eos, Fluid: 0 %
Lymphs, Fluid: 5 %
Monocyte-Macrophage-Serous Fluid: 93 % — ABNORMAL HIGH (ref 50–90)
Neutrophil Count, Fluid: 2 % (ref 0–25)
Total Nucleated Cell Count, Fluid: 27 cu mm (ref 0–1000)

## 2019-12-26 LAB — GLUCOSE, CAPILLARY
Glucose-Capillary: 225 mg/dL — ABNORMAL HIGH (ref 70–99)
Glucose-Capillary: 226 mg/dL — ABNORMAL HIGH (ref 70–99)
Glucose-Capillary: 228 mg/dL — ABNORMAL HIGH (ref 70–99)
Glucose-Capillary: 238 mg/dL — ABNORMAL HIGH (ref 70–99)
Glucose-Capillary: 258 mg/dL — ABNORMAL HIGH (ref 70–99)
Glucose-Capillary: 326 mg/dL — ABNORMAL HIGH (ref 70–99)

## 2019-12-26 SURGERY — VIDEO BRONCHOSCOPY WITHOUT FLUORO
Anesthesia: General

## 2019-12-26 MED ORDER — LACTATED RINGERS IV SOLN
INTRAVENOUS | Status: DC
  Administered 2019-12-26: 08:00:00 1000 mL via INTRAVENOUS

## 2019-12-26 MED ORDER — HYDROMORPHONE HCL 1 MG/ML IJ SOLN
0.5000 mg | INTRAMUSCULAR | Status: DC | PRN
  Administered 2019-12-26 – 2019-12-29 (×13): 1 mg via INTRAVENOUS
  Filled 2019-12-26 (×13): qty 1

## 2019-12-26 MED ORDER — INSULIN ASPART 100 UNIT/ML ~~LOC~~ SOLN
0.0000 [IU] | SUBCUTANEOUS | Status: DC
  Administered 2019-12-26: 11 [IU] via SUBCUTANEOUS
  Administered 2019-12-26: 7 [IU] via SUBCUTANEOUS
  Administered 2019-12-26: 15 [IU] via SUBCUTANEOUS
  Administered 2019-12-27 (×2): 4 [IU] via SUBCUTANEOUS
  Administered 2019-12-27: 11 [IU] via SUBCUTANEOUS
  Administered 2019-12-27: 7 [IU] via SUBCUTANEOUS
  Administered 2019-12-27 – 2019-12-28 (×2): 4 [IU] via SUBCUTANEOUS
  Administered 2019-12-28: 7 [IU] via SUBCUTANEOUS
  Administered 2019-12-28: 20 [IU] via SUBCUTANEOUS
  Administered 2019-12-28: 15 [IU] via SUBCUTANEOUS
  Administered 2019-12-28: 7 [IU] via SUBCUTANEOUS
  Administered 2019-12-28: 4 [IU] via SUBCUTANEOUS
  Administered 2019-12-29: 7 [IU] via SUBCUTANEOUS
  Administered 2019-12-29: 4 [IU] via SUBCUTANEOUS
  Administered 2019-12-29: 11 [IU] via SUBCUTANEOUS
  Administered 2019-12-29: 4 [IU] via SUBCUTANEOUS
  Administered 2019-12-30: 11 [IU] via SUBCUTANEOUS
  Administered 2019-12-30: 4 [IU] via SUBCUTANEOUS
  Administered 2019-12-30 (×2): 7 [IU] via SUBCUTANEOUS
  Administered 2019-12-31 (×2): 11 [IU] via SUBCUTANEOUS
  Administered 2019-12-31: 3 [IU] via SUBCUTANEOUS
  Administered 2019-12-31: 20 [IU] via SUBCUTANEOUS
  Administered 2019-12-31: 11 [IU] via SUBCUTANEOUS
  Administered 2020-01-01 (×2): 4 [IU] via SUBCUTANEOUS
  Administered 2020-01-01: 7 [IU] via SUBCUTANEOUS
  Administered 2020-01-01 (×2): 4 [IU] via SUBCUTANEOUS
  Administered 2020-01-02: 11 [IU] via SUBCUTANEOUS
  Administered 2020-01-02: 4 [IU] via SUBCUTANEOUS
  Administered 2020-01-02: 20 [IU] via SUBCUTANEOUS
  Administered 2020-01-02: 4 [IU] via SUBCUTANEOUS
  Administered 2020-01-02: 15 [IU] via SUBCUTANEOUS
  Administered 2020-01-02: 4 [IU] via SUBCUTANEOUS
  Administered 2020-01-03 (×2): 20 [IU] via SUBCUTANEOUS
  Administered 2020-01-03: 7 [IU] via SUBCUTANEOUS
  Administered 2020-01-03 (×2): 11 [IU] via SUBCUTANEOUS

## 2019-12-26 MED ORDER — DEXAMETHASONE SODIUM PHOSPHATE 10 MG/ML IJ SOLN
INTRAMUSCULAR | Status: DC | PRN
  Administered 2019-12-26: 10 mg via INTRAVENOUS

## 2019-12-26 MED ORDER — ADULT MULTIVITAMIN W/MINERALS CH
1.0000 | ORAL_TABLET | Freq: Every day | ORAL | Status: DC
  Administered 2019-12-26 – 2020-01-12 (×17): 1 via ORAL
  Filled 2019-12-26 (×19): qty 1

## 2019-12-26 MED ORDER — PROPOFOL 10 MG/ML IV BOLUS
INTRAVENOUS | Status: DC | PRN
  Administered 2019-12-26: 150 mg via INTRAVENOUS
  Administered 2019-12-26: 50 mg via INTRAVENOUS

## 2019-12-26 MED ORDER — FENTANYL CITRATE (PF) 100 MCG/2ML IJ SOLN
INTRAMUSCULAR | Status: DC | PRN
  Administered 2019-12-26 (×2): 50 ug via INTRAVENOUS

## 2019-12-26 MED ORDER — MIDAZOLAM HCL 5 MG/5ML IJ SOLN
INTRAMUSCULAR | Status: DC | PRN
  Administered 2019-12-26: 2 mg via INTRAVENOUS

## 2019-12-26 MED ORDER — LIDOCAINE HCL (PF) 1 % IJ SOLN
INTRAMUSCULAR | Status: AC
  Filled 2019-12-26: qty 30

## 2019-12-26 MED ORDER — ENSURE ENLIVE PO LIQD
237.0000 mL | Freq: Three times a day (TID) | ORAL | Status: DC
  Administered 2019-12-26 – 2020-01-11 (×26): 237 mL via ORAL
  Filled 2019-12-26: qty 237

## 2019-12-26 MED ORDER — ONDANSETRON HCL 4 MG/2ML IJ SOLN
INTRAMUSCULAR | Status: DC | PRN
  Administered 2019-12-26: 4 mg via INTRAVENOUS

## 2019-12-26 MED ORDER — LIDOCAINE 2% (20 MG/ML) 5 ML SYRINGE
INTRAMUSCULAR | Status: DC | PRN
  Administered 2019-12-26: 100 mg via INTRAVENOUS

## 2019-12-26 NOTE — Progress Notes (Signed)
Inpatient Diabetes Program Recommendations  AACE/ADA: New Consensus Statement on Inpatient Glycemic Control (2015)  Target Ranges:  Prepandial:   less than 140 mg/dL      Peak postprandial:   less than 180 mg/dL (1-2 hours)      Critically ill patients:  140 - 180 mg/dL   Lab Results  Component Value Date   GLUCAP 226 (H) 12/26/2019   HGBA1C 13.5 (H) 12/24/2019    Review of Glycemic Control Results for KARSEN, NAKANISHI (MRN [id number]) as of 12/26/2019 10:59  Ref. Range 12/25/2019 12:22 12/25/2019 17:47 12/26/2019 00:11 12/26/2019 03:19 12/26/2019 07:37  Glucose-Capillary Latest Ref Range: 70 - 99 mg/dL 191 (H) 280 (H) 228 (H) 238 (H) 226 (H)  Results for KYEN, TAITE (MRN [id number]) as of 12/26/2019 10:59  Ref. Range 12/24/2019 23:55  Hemoglobin A1C Latest Ref Range: 4.8 - 5.6 % 13.5 (H)   Diabetes history: DM 2 Outpatient Diabetes medications: Glimepiride 4 mg Daily, Metformin 1000 mg qam, 500 mg qpm, possibly basal insulin Current orders for Inpatient glycemic control:  Novolog 0-24 units Q4 hours   Inpatient Diabetes Program Recommendations:    Please consider adding basal insulin-  Levemir 10 units daily   Thank you, Reche Dixon, RN, BSN Diabetes Coordinator Inpatient Diabetes Program [phone] (team pager from 8a-5p)

## 2019-12-26 NOTE — Progress Notes (Signed)
Subjective:  ON Events: No events overnight. Patient seen this afternoon after his procedure. Patient states that he is doing better than yesterday and tolerated his procedure well. He states that he still has a headache and the dilaudid he was given has taken the edge off but needs something to "erase" the pain. We discussed that he is on steroids for his inflammation and that it may take a couple of days to see the effects. Patient was agreeable. All questions and concerns were addressed.   Objective:  Vital signs in last 24 hours: Vitals:   12/25/19 1600 12/25/19 1745 12/25/19 2302 12/26/19 0321  BP: 128/64 117/69 130/84   Pulse: 79 81 80   Resp: 15     Temp:  97.6 F (36.4 C) 97.9 F (36.6 C)   TempSrc:      SpO2: 99% 93% 91%   Weight:    74.4 kg  Height:       Physical Exam Vitals and nursing note reviewed.  Constitutional:      General: He is not in acute distress.    Appearance: Normal appearance. He is not ill-appearing or toxic-appearing.  HENT:     Head: Normocephalic and atraumatic.  Eyes:     General:        Right eye: No discharge.        Left eye: No discharge.     Conjunctiva/sclera: Conjunctivae normal.  Cardiovascular:     Rate and Rhythm: Normal rate and regular rhythm.     Pulses: Normal pulses.     Heart sounds: Normal heart sounds. No murmur. No friction rub. No gallop.   Pulmonary:     Effort: Pulmonary effort is normal.     Breath sounds: Normal breath sounds. No wheezing, rhonchi or rales.  Abdominal:     General: Bowel sounds are normal.     Palpations: Abdomen is soft.     Tenderness: There is no abdominal tenderness. There is no guarding.  Musculoskeletal:        General: No swelling.     Right lower leg: No edema.     Left lower leg: No edema.  Neurological:     General: No focal deficit present.     Mental Status: He is alert and oriented to person, place, and time.  Psychiatric:        Mood and Affect: Mood normal.        Behavior:  Behavior normal.     CBC Latest Ref Rng & Units 12/25/2019 12/24/2019 01/30/2019  WBC 4.0 - 10.5 K/uL 11.1(H) 12.7(H) 10.0  Hemoglobin 13.0 - 17.0 g/dL 11.2(L) 12.3(L) 10.3(L)  Hematocrit 39.0 - 52.0 % 35.7(L) 38.9(L) 32.2(L)  Platelets 150 - 400 K/uL 643(H) 645(H) 860(H)   CMP Latest Ref Rng & Units 12/25/2019 12/24/2019 01/30/2019  Glucose 70 - 99 mg/dL 160(H) 204(H) 160(H)  BUN 6 - 20 mg/dL 10 14 <5(L)  Creatinine 0.61 - 1.24 mg/dL 0.66 0.68 0.59(L)  Sodium 135 - 145 mmol/L 134(L) 132(L) 134(L)  Potassium 3.5 - 5.1 mmol/L 4.2 4.5 4.3  Chloride 98 - 111 mmol/L 96(L) 91(L) 100  CO2 22 - 32 mmol/L _0 Calcium 8.9 - 10.3 mg/dL 9.1 9.7 8.9  Total Protein 6.5 - 8.1 g/dL 7.1 8.2(H) -  Total Bilirubin 0.3 - 1.2 mg/dL 0.4 0.7 -  Alkaline Phos 38 - 126 U/L 429(H) 471(H) -  AST 15 - 41 U/L 15 15 -  ALT 0 - 44 U/L 19  21 -   CBG (last 3)  Recent Labs    12/25/19 1747 12/26/19 0011 12/26/19 0319  GLUCAP 280* 228* 238*   Assessment/Plan:  Active Problems:   Uncontrolled type 2 diabetes mellitus with hyperglycemia (HCC)   Essential hypertension   Status post thoracotomy   Poor dentition   Cavitary lesion of lung   History of pleural effusion   Dyslipidemia   Cerebellar mass   Hypothyroid   Normocytic anemia   Unintentional weight loss   Hemoptysis   Dysphagia  Persistent Headaches Right cerebellar Cystic Mass: Likely 2/2 to persistent headaches related to cerebellar mass with mass effect.   - Neurosurgery recommends outpatient follow up.   - will try decadron 52m q6h PRN  - Dilaudid 0.517mQ4H for headache. - Reconsider neurosurgery re-consultation pending bronchoscopy results.   Cavitary Lung Disease Pt with known pulmonary cavitary lesions of the left lower lobe and numerous cavitary lesions in the bilateral lung. Current every day smoker. Continues to experience hemoptysis, weight loss, and intermittent dyspnea. Overall picture at that time concerning for malignancy vs  disseminated fungal/bacterial infection. Will have bronchoscopy with bronchoalveolar lavage of the lower left lobe to attempt to obtain culture. - Pulmonary consulted, we appreciate their recommendations.  - Infectious Disease consulted and following. We appreciate their recommendations.  - Will appreciate PT/OT recommendations. - TTE: processing.  Dysphagia:  Patient still endorses intermittent dysphagia to solids. Handles liquids well. Passed bedside swallow.  - Speech to take for barium swallow tomorrow - Diet: CM   Type II Diabetes - uncontrolled A1c 13.5 on admission. Pt on glimepiride 81m29maily and metformin 1000m581mD at home.  - Due to scheduled Decadron, changed SSI to resistant.   Hypertension: - Losartan 50mg85m- HCZ 12.5mg Q2mHypothyroidism - Levothyroxine 25mcg 47mPrior to Admission Living Arrangement: Home Anticipated Discharge Location: Home Barriers to Discharge: Continued Medical workup Dispo: Anticipated discharge in approximately 2-3 days   WintersMaudie Mercury4/2021, 6:49 AM Pager: [phone]

## 2019-12-26 NOTE — Plan of Care (Signed)

## 2019-12-26 NOTE — Transfer of Care (Signed)
Immediate Anesthesia Transfer of Care Note  Patient: Gary Hudson  Procedure(s) Performed: VIDEO BRONCHOSCOPY WITHOUT FLUORO (N/A )  Patient Location: Endoscopy Unit  Anesthesia Type:General  Level of Consciousness: drowsy and patient cooperative  Airway & Oxygen Therapy: Patient Spontanous Breathing and Patient connected to face mask oxygen  Post-op Assessment: Report given to RN and Post -op Vital signs reviewed and stable  Post vital signs: Reviewed and stable  Last Vitals:  Vitals Value Taken Time  BP 121/72   Temp    Pulse 84   Resp 14   SpO2 98%     Last Pain:  Vitals:   12/26/19 0809  TempSrc:   PainSc: 9       Patients Stated Pain Goal: 0 (21/97/58 8325)  Complications: No apparent anesthesia complications

## 2019-12-26 NOTE — Anesthesia Postprocedure Evaluation (Signed)
Anesthesia Post Note  Patient: [name]  Procedure(s) Performed: VIDEO BRONCHOSCOPY WITHOUT FLUORO (N/A )     Patient location during evaluation: Endoscopy Anesthesia Type: General Level of consciousness: awake and alert Pain management: pain level controlled Vital Signs Assessment: post-procedure vital signs reviewed and stable Respiratory status: spontaneous breathing, nonlabored ventilation, respiratory function stable and patient connected to nasal cannula oxygen Cardiovascular status: blood pressure returned to baseline and stable Postop Assessment: no apparent nausea or vomiting Anesthetic complications: no    Last Vitals:  Vitals:   12/26/19 1040 12/26/19 1053  BP: (!) 151/84 125/89  Pulse:    Resp: 16 (!) 22  Temp:    SpO2: 99% 98%    Last Pain:  Vitals:   12/26/19 1329  TempSrc:   PainSc: 5                  Rubel Heckard DANIEL

## 2019-12-26 NOTE — Progress Notes (Signed)
Initial Nutrition Assessment  DOCUMENTATION CODES:   Not applicable,   INTERVENTION:   - Ensure Enlive po TID, each supplement provides 350 kcal and 20 grams of protein  - Double protein portions TID with meals  - MVI with minerals daily  NUTRITION DIAGNOSIS:   Inadequate oral intake related to decreased appetite as evidenced by per patient/family report.  GOAL:   Patient will meet greater than or equal to 90% of their needs  MONITOR:   PO intake, Supplement acceptance, Labs, Weight trends  REASON FOR ASSESSMENT:   Malnutrition Screening Tool    ASSESSMENT:   54 year old male who presented on 5/02 with headache, decreased PO intake, AMS. PMH of loculated left empyema with cavitary lung lesion in the LLL s/p VATS in 01/2019, tobacco use, T2DM, HTN, hypothyroidism, recent hospitalization for cavitary lung lesions and a cerebellar mass of unknown etiology.   5/04 - s/p bronchoscopy  Spoke with pt and family members at bedside. Pt reports that his appetite is good right now and that he ate well at lunch (meatloaf, pudding, salad, milk). Pt's family member states that this is the most pt has consumed in several weeks. Family reports pt's appetite started decreasing 2 weeks ago but that he began losing weight back in December of 2020. Pt reports his UBW as 278 lbs and that he now weighs 165-166 lbs.  Reviewed weight history in chart. Pt with a 9.2 kg weight loss over the last 10 months. This is an 11% weight loss which is not quite significant for timeframe but is concerning.  Suspect pt with malnutrition but unable to confirm at this time. RD interview was cut short due to visit from MD. Will complete NFPE and obtain additional diet and weight history at follow-up.  RD will order oral nutrition supplements to aid pt in meeting kcal and protein needs as well as daily MVI and double protein portions with meals.  Medications reviewed and include: decadron, SSI q 4 hours  Labs  reviewed: sodium 134, chloride 96 CBG's: 225-280 x 24 hours  NUTRITION - FOCUSED PHYSICAL EXAM:  Unable to complete at this time. RD interview cut short due to MD visit.  Diet Order:   Diet Order            Diet Carb Modified Fluid consistency: Thin; Room service appropriate? Yes  Diet effective now              EDUCATION NEEDS:   No education needs have been identified at this time  Skin:  Skin Assessment: Reviewed RN Assessment  Last BM:  no documented BM  Height:   Ht Readings from Last 1 Encounters:  12/24/19 5\' 11"  (1.803 m)    Weight:   Wt Readings from Last 1 Encounters:  12/26/19 74.4 kg    Ideal Body Weight:  78.2 kg  BMI:  Body mass index is 22.88 kg/m.  Estimated Nutritional Needs:   Kcal:  2300-2500  Protein:  105-125 grams  Fluid:  >/= 2.0 L    Gary Face, MS, RD, LDN Inpatient Clinical Dietitian Pager: [phone] Weekend/After Hours: [phone]

## 2019-12-26 NOTE — Op Note (Signed)
Bronchoscopy Procedure Note  Date of Operation: 12/26/2019  Pre-op Diagnosis: pulmonary nodules  Post-op Diagnosis: same  Surgeon: Spero Geralds  Anesthesia: General LMA anesthesia, Per anesthesia record  Operation: Flexible fiberoptic bronchoscopy, diagnostic   Findings: sabersheath trachea. Diffuse mild secretions, puckered airways especially in the left lower lobes.   Specimen: BAL and brushings  Estimated Blood Loss: Minimal  Complications: none  Indications and History: The patient is a 54 y.o. male with history of left sided empyema s/p VATS decortication in 2020. Here with constitutional symptoms and progressive cavitary pulmonary nodules bilaterally (left greater than right,) in the context of cerebellar mass.  The risks, benefits, complications, treatment options and expected outcomes were discussed with the patient.  The possibilities of reaction to medication, pulmonary aspiration, perforation of a viscus, bleeding, failure to diagnose a condition and creating a complication requiring transfusion or operation were discussed with the patient who freely signed the consent.    Description of Procedure: The patient was seen in the Holding Room and the site of surgery properly noted/marked.  The patient was taken to Endoscopy 1, identified as Gary Hudson and the procedure verified as Flexible Fiberoptic Bronchoscopy.  A Time Out was held and the above information confirmed.   After the induction of anesthesia the bronchoscope was passed through the LMA. The vocal cords were visualized and normal. The scope was then passed into the trachea.  Careful inspection of the tracheal lumen was accomplished. The scope was sequentially passed into the left main and then left upper and lower bronchi and segmental bronchi. Brushings were taken from two separate segments of the left lower lobe  The scope was then withdrawn and advanced into the right main bronchus and then into the RUL, RML,  and RLL bronchi and segmental bronchi. BAL and brushings were taken from the posterior basal and lateral basal segments of the left lower lobe. A BAL was taken from the left upper lobe.   Endobronchial findings: no endobronchial lesions Trachea: sabersheath Carina: Normal mucosa and Sharp Right tracheobronchial tree: Normal mucosa and all subsegments were intubated without endobronchial lesions.  Right upper lobe bronchus: bifurcated Left tracheobronchial tree: edematous, sickly mucosa which was friable. Thin mucous was suctioned.   The Patient was taken to the Endoscopy Recovery area in satisfactory condition. He will be returned to inpatient care to follow up on results.   Spero Geralds

## 2019-12-26 NOTE — Evaluation (Addendum)
Clinical/Bedside Swallow Evaluation Patient Details  Name: Gary Hudson MRN: [id number] Date of Birth: Mar 28, 1966  Today's Date: 12/26/2019 Time: SLP Start Time (ACUTE ONLY): 1517 SLP Stop Time (ACUTE ONLY): 1210 SLP Time Calculation (min) (ACUTE ONLY): 12 min  Past Medical History:  Past Medical History:  Diagnosis Date  . Diabetes mellitus without complication [facility])    Past Surgical History:  Past Surgical History:  Procedure Laterality Date  . TEE WITHOUT CARDIOVERSION N/A 01/26/2019   Procedure: TRANSESOPHAGEAL ECHOCARDIOGRAM (TEE);  Surgeon: Prescott Gum, Collier Salina, MD;  Location: [facility] OR;  Service: Thoracic;  Laterality: N/A;  . VIDEO ASSISTED THORACOSCOPY (VATS)/EMPYEMA Left 01/26/2019   Procedure: VIDEO ASSISTED THORACOSCOPY (VATS)/DRAINAGE OF EMPYEMA  AND DECORTICATION;  Surgeon: Ivin Poot, MD;  Location: [place];  Service: Thoracic;  Laterality: Left;  Marland Kitchen VIDEO BRONCHOSCOPY N/A 01/26/2019   Procedure: VIDEO BRONCHOSCOPY;  Surgeon: Prescott Gum, Collier Salina, MD;  Location: [place];  Service: Thoracic;  Laterality: N/A;   HPI:  Gary Hudson is a 54 year old M with significant PMH of loculated left empyema with cavitary lung lesion in the left lower lobe s/p VATS in 01/2019, significant tobacco use, type II diabetes, hypertension, and hypothyroidism. He presents to the ED today for intractable headaches. Of note, he had a recent hospitalization 1 month ago after presenting for hemoptysis and weight loss and was found to have enlargement of his prior cavitary lung lesions with numerous bilateral smaller lesions and right cerebellar cystic mass.   Assessment / Plan / Recommendation Clinical Impression   Gary Hudson was seen for a clinical swallow evaluation, which was largely unremarkable. Pt does, however, have PMH concerning for reduced sensation of aspiration/silent aspiration (cerebellar mass and cavitary process t/o lungs). 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 Pt/family report this is r/t his cerebellar mass. Pt was seen with thin liquids via cup/straw and consumed 3 oz of thin liquids consecutively with noted mild throat clear following intake. He consumed single sips via cup/straw without s/s aspiration. He consumed regular solids without s/s aspiration or dysphagia. He does report historically having intermittent dysphagia to solids with globus at level of sternal notch. Based on today's findings, recommend pt initiate a regular solid diet with thin liquids with precautions to eat/drink slowly in single bites/sips; meds as tolerated. Plan to have modified barium swallow study completed tomorrow 12/27/19 to r/o silent aspiration.  Findings discussed with family, pt, RN, who demonstrate understanding and agreement with plan.   SLP Visit Diagnosis: Dysphagia, unspecified (R13.10)    Aspiration Risk  Mild aspiration risk    Diet Recommendation Regular;Thin liquid   Liquid Administration via: Cup;Straw Medication Administration: Whole meds with liquid Supervision: Patient able to self feed    Other  Recommendations Oral Care Recommendations: Oral care BID   Frequency and Duration min 1 x/week  1 week       Prognosis Prognosis for Safe Diet Advancement: Fair Barriers to Reach Goals: Motivation;Behavior      Swallow Study   General Date of Onset: 12/25/19 HPI: Gary Hudson is a 54 year old M with significant PMH of loculated left empyema with cavitary lung lesion in the left lower lobe s/p VATS in 01/2019, significant tobacco use, type II diabetes, hypertension, and hypothyroidism. He presents to the ED today for intractable headaches. Of note, he had a recent hospitalization 1 month ago after presenting for hemoptysis and weight loss and was found to  have enlargement of his prior cavitary lung lesions with numerous bilateral smaller lesions and right cerebellar cystic mass. Type of Study: Bedside Swallow  Evaluation Previous Swallow Assessment: n/a Diet Prior to this Study: NPO Temperature Spikes Noted: No Respiratory Status: Room air History of Recent Intubation: No Behavior/Cognition: Alert;Agitated Oral Cavity Assessment: Within Functional Limits Oral Care Completed by SLP: No Oral Cavity - Dentition: Poor condition;Adequate natural dentition Vision: Functional for self-feeding Self-Feeding Abilities: Able to feed self Patient Positioning: Upright in bed Baseline Vocal Quality: Hoarse;Other (comment)(h/o VF paralysis (unilateral)) Volitional Swallow: Able to elicit    Oral/Motor/Sensory Function Overall Oral Motor/Sensory Function: Within functional limits   Ice Chips Presentation: Cup   Thin Liquid Thin Liquid: Within functional limits; mild cough noted after 3 oz challenge Presentation: Cup;Straw    Solid    Gary Hudson, M.S., CCC-SLP Speech-Language Pathologist Acute Rehabilitation Services Pager: [phone]  Solid: Within functional limits Presentation: [place] 12/26/2019,12:34 PM

## 2019-12-26 NOTE — Progress Notes (Signed)
Occupational Therapy Evaluation Patient Details Name: Gary Hudson MRN: [id number] DOB: [birth year] Today's Date: 12/26/2019    History of Present Illness This is a 54 year old gentleman with a history of tobacco use disorder and previous empyema of the left lower lobe status post VATS decortication in June 2020.  He presents with several months of hemoptysis, headache and unintentional weight loss.  Was recently admitted to [facility] on 4/6 with these complaints and had prolonged hospital stay.  Tuberculosis was ruled out.  He had an EBUS which was negative for malignancy.  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  He was supposed to have a CT-guided biopsy but checked himself out AMA.  He presented yesterday to the [facility], ED with worsening shortness of breath and headache.    Clinical Impression   PTA pt lived with his nephew, independent in all ADL, IADL, and mobility tasks. Pt does not ambulate with an assistive device and reports 0 falls in the last 6 months. 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 Pt's performance limited this date due to 9/10 HA pain. Pt able to ambulate to/from bathroom with supervision. 0 instances of LOB, however pt unsteady on feet. Pt completed toileting task with use of grab bars and simulated tub/shower with min guard. Pt demonstrates decreased endurance, balance, standing tolerance, and activity tolerance impacting ability to complete self-care and functional transfer tasks. Recommend skilled OT services to address above deficits in order to promote function and prevent further decline.     Follow Up Recommendations  No OT follow up;Supervision - Intermittent    Equipment Recommendations  3 in 1 bedside commode(for use in shower)    Recommendations for Other Services       Precautions / Restrictions Restrictions Weight Bearing Restrictions: No      Mobility Bed Mobility Overal bed mobility: Independent                 Transfers Overall transfer level: Needs assistance Equipment used: None Transfers: Sit to/from Stand Sit to Stand: Supervision         General transfer comment: To ensure balance and safety    Balance Overall balance assessment: Mild deficits observed, not formally tested                                         ADL either performed or assessed with clinical judgement   ADL Overall ADL's : Needs assistance/impaired Eating/Feeding: Independent;Sitting   Grooming: Supervision/safety;Standing Grooming Details (indicate cue type and reason): While standing at the sink Upper Body Bathing: Set up;Supervision/ safety;Sitting   Lower Body Bathing: Supervison/ safety;Set up;Sit to/from stand   Upper Body Dressing : Set up;Supervision/safety;Sitting   Lower Body Dressing: Set up;Supervision/safety;Sitting/lateral leans   Toilet Transfer: Set up;Supervision/safety;Ambulation;Regular Toilet;Grab bars   Toileting- Clothing Manipulation and Hygiene: Supervision/safety;Sit to/from stand   Tub/ Shower Transfer: Min guard;Tub transfer;Grab bars Tub/Shower Transfer Details (indicate cue type and reason): Simulated tub/shower transfer in room  Functional mobility during ADLs: Supervision/safety General ADL Comments: Pt able to ambulate to/from bathroom with supervision. 0 instances of LOB. Limited activity tolerance due to HA.      Vision Baseline Vision/History: Wears glasses Wears Glasses: Reading only       Perception     Praxis      Pertinent Vitals/Pain Pain Assessment: 0-10 Pain Score: 9  Pain  Location: head Pain Descriptors / Indicators: Headache Pain Intervention(s): Monitored during session     Hand Dominance Right   Extremity/Trunk Assessment Upper Extremity Assessment Upper Extremity Assessment: Overall WFL for tasks assessed   Lower Extremity Assessment Lower Extremity Assessment: Defer to PT evaluation       Communication  Communication Communication: Other (comment)(hoarse voice)   Cognition Arousal/Alertness: Awake/alert Behavior During Therapy: WFL for tasks assessed/performed Overall Cognitive Status: Within Functional Limits for tasks assessed                                 General Comments: A&O x 4. Pt pleasant and willing to participate in therapy tasks. Pt able to follow multi-step instructions.    General Comments       Exercises     Shoulder Instructions      Home Living Family/patient expects to be discharged to:: Private residence Living Arrangements: Other (Comment)(nephew) Available Help at Discharge: Family(nephew, girlfriend, and sister) Type of Home: House Home Access: Stairs to enter Technical brewer of Steps: 1   Home Layout: One level     Bathroom Shower/Tub: Teacher, early years/pre: Standard     Home Equipment: None          Prior Functioning/Environment Level of Independence: Independent        Comments: Pt independent in all ADL, IADL, and mobility tasks. Pt does not ambulate with an assistive device and reports 0 falls in the last 6 months. 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         OT Problem List: Decreased activity tolerance;Impaired balance (sitting and/or standing)      OT Treatment/Interventions: Self-care/ADL training;Therapeutic exercise;Neuromuscular education;Energy conservation;DME and/or AE instruction;Therapeutic activities;Patient/family education;Balance training    OT Goals(Current goals can be found in the care plan section) Acute Rehab OT Goals Patient Stated Goal: to go home Time For Goal Achievement: 01/09/20 Potential to Achieve Goals: Good ADL Goals Pt Will Perform Tub/Shower Transfer: Tub transfer;with modified independence Additional ADL Goal #1: Pt to complete all ADLs independently with 0 instances of LOB. Additional ADL Goal #2: Pt to complete higher level IADLs (i.e. bed making,  item retrieval) independently. Additional ADL Goal #3: Pt to tolerate standing up to 10 min independently, in preparation for ADLs.  OT Frequency: Min 2X/week   Barriers to D/C:            Co-evaluation              AM-PAC OT "6 Clicks" Daily Activity     Outcome Measure Help from another person eating meals?: None Help from another person taking care of personal grooming?: A Little Help from another person toileting, which includes using toliet, bedpan, or urinal?: A Little Help from another person bathing (including washing, rinsing, drying)?: A Little Help from another person to put on and taking off regular upper body clothing?: A Little Help from another person to put on and taking off regular lower body clothing?: A Little 6 Click Score: 19   End of Session Equipment Utilized During Treatment: Gait belt Nurse Communication: Mobility status  Activity Tolerance: Patient limited by pain Patient left: in bed;with call bell/phone within reach  OT Visit Diagnosis: Unsteadiness on feet (R26.81);Muscle weakness (generalized) (M62.81);Pain Pain - Right/Left: (Anterior) Pain - part of body: (head)                Time: 1430-1440  OT Time Calculation (min): 10 min Charges:  OT General Charges $OT Visit: 1 Visit OT Evaluation $OT Eval Low Complexity: 1 Low  Mauri Brooklyn OTR/L [place] 12/26/2019, 2:55 PM

## 2019-12-26 NOTE — Progress Notes (Signed)
Pt off unit

## 2019-12-26 NOTE — Anesthesia Procedure Notes (Addendum)
Procedure Name: LMA Insertion Date/Time: 12/26/2019 9:12 AM Performed by: Genelle Bal, CRNA Pre-anesthesia Checklist: Patient identified, Emergency Drugs available, Suction available and Patient being monitored Patient Re-evaluated:Patient Re-evaluated prior to induction Oxygen Delivery Method: Circle system utilized Preoxygenation: Pre-oxygenation with 100% oxygen Induction Type: IV induction Ventilation: Mask ventilation without difficulty LMA: LMA inserted LMA Size: 4.0 Number of attempts: 1 Airway Equipment and Method: Bite block Placement Confirmation: positive ETCO2 Tube secured with: Tape Dental Injury: Teeth and Oropharynx as per pre-operative assessment  Comments: Lower loose teeth remain present as pre-op following airway manipulation & LMA placement.

## 2019-12-26 NOTE — Progress Notes (Signed)
  Echocardiogram 2D Echocardiogram has been performed.  Gary Hudson 12/26/2019, 1:15 PM

## 2019-12-26 NOTE — Evaluation (Signed)
Physical Therapy Evaluation Patient Details Name: Gary Hudson MRN: [id number] DOB: [birth year] Today's Date: 12/26/2019   History of Present Illness  This is a 54 year old gentleman with a history of tobacco use disorder and previous empyema of the left lower lobe status post VATS decortication in June 2020.  He presents with several months of hemoptysis, headache and unintentional weight loss.  Was recently admitted to [facility] on 4/6 with these complaints and had prolonged hospital stay.  Tuberculosis was ruled out.  He had an EBUS which was negative for malignancy.  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  He was supposed to have a CT-guided biopsy but checked himself out AMA.  He presented yesterday to the [facility], ED with worsening shortness of breath and headache.   Clinical Impression  Patient presents with mobility limited due to fatigue and still sleepy from medications from his procedure earlier.  Had somewhat of a broad based gait with in room ambulation and some unsteadiness but also could be due to medications.  Needs further skilled PT assessment for further recommendations for follow PT and or any DME.  PT will follow acutely.     Follow Up Recommendations No PT follow up    Equipment Recommendations  None recommended by PT    Recommendations for Other Services       Precautions / Restrictions Precautions Precautions: None Restrictions Weight Bearing Restrictions: No      Mobility  Bed Mobility Overal bed mobility: Independent                Transfers Overall transfer level: Modified independent Equipment used: None Transfers: Sit to/from Stand Sit to Stand: Supervision         General transfer comment: To ensure balance and safety  Ambulation/Gait Ambulation/Gait assistance: Supervision;Min guard Gait Distance (Feet): 40 Feet Assistive device: None Gait Pattern/deviations: Wide base of support;Staggering left;Staggering  right;Step-through pattern     General Gait Details: reports sleepy from meds from procedure making  a littel wobbly; intermittent minguard for safety  Stairs            Wheelchair Mobility    Modified Rankin (Stroke Patients Only)       Balance Overall balance assessment: Mild deficits observed, not formally tested                                           Pertinent Vitals/Pain Pain Assessment: Faces Pain Score: 9  Faces Pain Scale: No hurt Pain Location: sleepy, denies pain Pain Descriptors / Indicators: Headache Pain Intervention(s): Monitored during session    Home Living Family/patient expects to be discharged to:: Private residence Living Arrangements: Other (Comment) Available Help at Discharge: Family Type of Home: House Home Access: Stairs to enter   Technical brewer of Steps: 1 Home Layout: One level Home Equipment: None      Prior Function Level of Independence: Independent         Comments: Pt independent in all ADL, IADL, and mobility tasks. Pt does not ambulate with an assistive device and reports 0 falls in the last 6 months. 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      Hand Dominance   Dominant Hand: Right    Extremity/Trunk Assessment   Upper Extremity Assessment Upper Extremity Assessment: Overall WFL for tasks assessed    Lower Extremity  Assessment Lower Extremity Assessment: Overall WFL for tasks assessed       Communication   Communication: Other (comment)  Cognition Arousal/Alertness: Lethargic Behavior During Therapy: Flat affect Overall Cognitive Status: Within Functional Limits for tasks assessed                                 General Comments: reports wanting to sleep, but willing to cooperate with in room mobility      General Comments General comments (skin integrity, edema, etc.): too sleepy to undergo testing today    Exercises     Assessment/Plan    PT  Assessment Patient needs continued PT services  PT Problem List Decreased coordination;Decreased balance;Decreased activity tolerance       PT Treatment Interventions Stair training;Therapeutic activities;Balance training;Gait training;Functional mobility training;Therapeutic exercise;Neuromuscular re-education;Patient/family education    PT Goals (Current goals can be found in the Care Plan section)  Acute Rehab PT Goals Patient Stated Goal: to go home PT Goal Formulation: With patient Time For Goal Achievement: 12/26/19 Potential to Achieve Goals: Good    Frequency Min 3X/week   Barriers to discharge        Co-evaluation               AM-PAC PT "6 Clicks" Mobility  Outcome Measure Help needed turning from your back to your side while in a flat bed without using bedrails?: None Help needed moving from lying on your back to sitting on the side of a flat bed without using bedrails?: None Help needed moving to and from a bed to a chair (including a wheelchair)?: None Help needed standing up from a chair using your arms (e.g., wheelchair or bedside chair)?: None Help needed to walk in hospital room?: A Little Help needed climbing 3-5 steps with a railing? : A Little 6 Click Score: 22    End of Session   Activity Tolerance: Patient limited by fatigue Patient left: in bed;with call bell/phone within reach   PT Visit Diagnosis: Other abnormalities of gait and mobility (R26.89)    Time: 6378-5885 PT Time Calculation (min) (ACUTE ONLY): 11 min   Charges:   PT Evaluation $PT Eval Low Complexity: [place], [place] Acute Rehabilitation Services [phone] 12/26/2019   Reginia Naas 12/26/2019, 4:51 PM

## 2019-12-27 ENCOUNTER — Inpatient Hospital Stay (HOSPITAL_COMMUNITY): Payer: 59

## 2019-12-27 DIAGNOSIS — J869 Pyothorax without fistula: Secondary | ICD-10-CM | POA: Diagnosis not present

## 2019-12-27 DIAGNOSIS — J984 Other disorders of lung: Secondary | ICD-10-CM | POA: Diagnosis not present

## 2019-12-27 DIAGNOSIS — G9389 Other specified disorders of brain: Secondary | ICD-10-CM | POA: Diagnosis not present

## 2019-12-27 LAB — GLUCOSE, CAPILLARY
Glucose-Capillary: 180 mg/dL — ABNORMAL HIGH (ref 70–99)
Glucose-Capillary: 195 mg/dL — ABNORMAL HIGH (ref 70–99)
Glucose-Capillary: 262 mg/dL — ABNORMAL HIGH (ref 70–99)
Glucose-Capillary: 175 mg/dL — ABNORMAL HIGH (ref 70–99)
Glucose-Capillary: 238 mg/dL — ABNORMAL HIGH (ref 70–99)

## 2019-12-27 LAB — ACID FAST SMEAR (AFB, MYCOBACTERIA): Acid Fast Smear: NEGATIVE

## 2019-12-27 LAB — CYTOLOGY - NON PAP

## 2019-12-27 MED ORDER — INSULIN GLARGINE 100 UNIT/ML ~~LOC~~ SOLN
10.0000 [IU] | Freq: Every day | SUBCUTANEOUS | Status: DC
  Administered 2019-12-27: 10 [IU] via SUBCUTANEOUS
  Filled 2019-12-27 (×2): qty 0.1

## 2019-12-27 MED ORDER — SENNOSIDES-DOCUSATE SODIUM 8.6-50 MG PO TABS
2.0000 | ORAL_TABLET | Freq: Two times a day (BID) | ORAL | Status: DC
  Administered 2019-12-27 – 2020-01-11 (×27): 2 via ORAL
  Filled 2019-12-27 (×31): qty 2

## 2019-12-27 MED ORDER — ENOXAPARIN SODIUM 40 MG/0.4ML ~~LOC~~ SOLN
40.0000 mg | SUBCUTANEOUS | Status: AC
  Administered 2019-12-27 – 2019-12-28 (×2): 40 mg via SUBCUTANEOUS
  Filled 2019-12-27 (×2): qty 0.4

## 2019-12-27 MED ORDER — POLYETHYLENE GLYCOL 3350 17 G PO PACK
17.0000 g | PACK | Freq: Once | ORAL | Status: AC
  Administered 2019-12-27: 17 g via ORAL
  Filled 2019-12-27: qty 1

## 2019-12-27 NOTE — Plan of Care (Signed)

## 2019-12-27 NOTE — Progress Notes (Signed)
Inpatient Diabetes Program Recommendations  AACE/ADA: New Consensus Statement on Inpatient Glycemic Control (2015)  Target Ranges:  Prepandial:   less than 140 mg/dL      Peak postprandial:   less than 180 mg/dL (1-2 hours)      Critically ill patients:  140 - 180 mg/dL   Lab Results  Component Value Date   GLUCAP 262 (H) 12/27/2019   HGBA1C 13.5 (H) 12/24/2019    Review of Glycemic Control Results for Gary Hudson, Gary Hudson (MRN [id number]) as of 12/27/2019 13:37  Ref. Range 12/26/2019 15:33 12/26/2019 23:12 12/27/2019 06:10 12/27/2019 07:29 12/27/2019 12:57  Glucose-Capillary Latest Ref Range: 70 - 99 mg/dL 326 (H) 258 (H) 180 (H) 195 (H) 262 (H)   Diabetes history: DM2  Outpatient Diabetes medications: Glimepiride 4 units qd + metformin 1000 qam and 500 qpm + Lantus 15 units daily  Current orders for Inpatient glycemic control: Lantus 10 units daily + Novolog 0-20 Q4H + decadron 4 mg Q6H + Ensure Enlive tid (44 grams of CHO)  Note:  Spoke with patient and sister Gary Hudson at bedside.  Reviewed patient's current A1c of 13.5% (average 341 mg/dl). Explained what a A1c is and what it measures. Also reviewed goal A1c with patient, importance of good glucose control @ home, and blood sugar goals.  He drinks a lot of sweet tea and does not watch his CHO intake.  He recently saw his PCP, Cyndi Bender in [facility] who started him on Lantus 15 units QAM.  He injects in his abdomen and rotates his areas.  He denies difficulties obtaining insulin and supplies.  He just started checking his blood sugar 1 week ago.  The readings are usually 175-275 mg/dl.  Educated patient and sister on the Plate Method.  He will be living with his sister after discharge. Asked patient to check CBG's before breakfast, mid afternoon and before bedtime.  He is aware to bring meter to PCP follow up appointment in June for MD to review CBG's.  Discussed hypoglycemia and treatment.  He should call his PCP if he has consistent CBG's <100 or  >200.  He should have a small snack prior to bed if CBG is 100mg /dl.  Will continue to follow while inpatient.  Thank you, Reche Dixon, RN, BSN Diabetes Coordinator Inpatient Diabetes Program [phone] (team pager from 8a-5p)

## 2019-12-27 NOTE — Consult Note (Signed)
NAME:  Gary Hudson, MRN:  [id number], DOB:  Jan 01, 1966, LOS: 3 ADMISSION DATE:  12/24/2019, CONSULTATION DATE:  5/3 REFERRING MD:  Dr. Neena Rhymes, CHIEF COMPLAINT:  Cavitary lesions.   Brief History   54 year old male with recent DX cavitary pulmonary lesions, now presenting with headache and found to have cerebellar mass.   History of present illness   54 year old male with PMH as below, which is significant for DM and empyema s/p VATs in 2020. He has at least a 60 pack year smoking history. More recently he was admitted to [facility] 4/6 with complaints of hemoptysis, headache, and unintentional weight loss and was found to have bilateral pulmonary cavitations There was also concern for vocal cord paralysis on CT neck, which rased concern for TB. TB was ruled out. Pulmonary was consulted for lesions. He underwent EBUS, from which pathology was negative for malignancy. He underwent additional bronchoscopy of LLL endobronchial lesion, FNA was also negative on pathology.  CT of the head demonstrated R cerebellar mass with vasogenic edema. He was started on decadron and his headaches improved nicely. Neurosurgery was consulted and were suspicious of CNS TB infection. ID was following and felt as though TB was ruled out. ACE, ANA, RF, legionella antigen, and ANCA testing all negative. He was scheduled for CT guided lung biopsy planned for 4/13, but he left AMA before this could be done.  He was set up for outpatient follow up with neurosurgery and pulmonary.   He presented to [facility] ED 5/2 with complaints of ongoing headache and shortness of breath. CT of the chest was repeated and cavitary disease was found to be progressive. CT of the head unchanged. Neurosurgery contacted in ED and recommended outpatient follow up.   Past Medical History   has a past medical history of Diabetes mellitus without complication ([place]).   Significant Hospital Events   4/6 admit to [facility] for hemoptysis/headache.  Found toh have innumerable pulmonary cavitations and a R cerebellar mass.  5/3 admit to [facility] for same.    Consults:  Pulm  Procedures:  5/4 Bronchoscopy with BAL and brushings  Significant Diagnostic Tests:  CT head 5/2 > Hypodense mass of the right cerebellar hemisphere with mass effect on the fourth ventricle and distal cerebral aqueduct. No hydrocephalus or hemorrhage CTA chest 5/2 >No PE, Known cavitary process throughout the lungs, with dominant left lower lobe cavitary lesion, similar to CT last month. Additional cavitary lesions throughout both lungs, also seen on prior, majority of which are stable. There is however a new/progressive left upper lobe cavitary lesion that measures 3.5 x 2.9 cm with surrounding consolidation. Slight progression in mediastinal and hilar adenopathy.  Micro Data:  COVID-19 5/2 neg  Antimicrobials:    Interim history/subjective:   This morning having a headache. Did well with bronch. Had swallow study this morning. No breathing issues. Objective   Blood pressure 119/69, pulse (!) 59, temperature 97.6 F (36.4 C), resp. rate 19, height _0  (1.803 m), weight 74.4 kg, SpO2 94 %.        Intake/Output Summary (Last 24 hours) at 12/27/2019 0948 Last data filed at 12/26/2019 2330 Gross per 24 hour  Intake 960 ml  Output -  Net 960 ml   Filed Weights   12/24/19 1510 12/26/19 0321  Weight: 74.8 kg 74.4 kg    Examination: General: Frail middle aged male in NAD HENT: [place]/AT, PERRL, no JVD. Poor dentition.  Lungs: Clear bilateral breath sounds Cardiovascular:  RRR, no MRG Abdomen: Soft, non-tender, non-distended Extremities: No acute deformity or ROM limitation.  Neuro: resting comfortably, follows commands and answers questions appropriately  Resolved Hospital Problem list     Assessment & Plan:  Gary Hudson is a 54 y.o. man who presents with:  History of LLL empyema s/p VATS decortication 2020 Pulmonary cavitary lesions: bilateral  innumerable lesions. Most notable in the LLL. Underwent EBUS and bronchoscopy at [facility] last month, both of which were negative. TB ruled out by ID during that admission and there was not felt to be an infectious etiology. ANA, ACE, RF, and ANCA all negative. Etiology unclear, but most concerning culprit is malignancy vs infection.    Cerebellar Mass: having significant pain, may or may not be related to his cavitary lesions, but defer to primary team. MRI may be helpful.    Plan: Will await bronch brushings and BAL results.  I'll send a galactomannan and fungitell assay although these will take some time.  Will need to discuss further steps with thoracic surgery if bronch x2 nondiagnostic.   PCCM will follow.  Lenice Llamas, MD Pulmonary and [place] Pager: [place]   CBC: Recent Labs  Lab 12/24/19 1657 12/25/19 0433  WBC 12.7* 11.1*  NEUTROABS 8.7*  --   HGB 12.3* 11.2*  HCT 38.9* 35.7*  MCV 84.9 85.8  PLT 645* 643*    Basic Metabolic Panel: Recent Labs  Lab 12/24/19 1657 12/25/19 0433  NA 132* 134*  K 4.5 4.2  CL 91* 96*  CO2 27 26  GLUCOSE 204* 160*  BUN 14 10  CREATININE 0.68 0.66  CALCIUM 9.7 9.1   GFR: Estimated Creatinine Clearance: 109.8 mL/min (by C-G formula based on SCr of 0.66 mg/dL). Recent Labs  Lab 12/24/19 1657 12/25/19 0433  WBC 12.7* 11.1*    Liver Function Tests: Recent Labs  Lab 12/24/19 1657 12/25/19 0433  AST 15 15  ALT 21 19  ALKPHOS 471* 429*  BILITOT 0.7 0.4  PROT 8.2* 7.1  ALBUMIN 3.0* 2.7*   No results for input(s): LIPASE, AMYLASE in the last 168 hours. No results for input(s): AMMONIA in the last 168 hours.  ABG    Component Value Date/Time   PHART 7.434 01/27/2019 0350   PCO2ART 43.0 01/27/2019 0350   PO2ART 78.7 (L) 01/27/2019 0350   HCO3 28.3 (H) 01/27/2019 0350   TCO2 31 01/26/2019 1002   O2SAT 95.7 01/27/2019 0350     Coagulation Profile:  No results for input(s): INR, PROTIME in the last 168 hours.  Cardiac Enzymes: No results for input(s): CKTOTAL, CKMB, CKMBINDEX, TROPONINI in the last 168 hours.  HbA1C: Hgb A1c MFr Bld  Date/Time Value Ref Range Status  12/24/2019 11:55 PM 13.5 (H) 4.8 - 5.6 % Final    Comment:    (NOTE) Pre diabetes:          5.7%-6.4% Diabetes:              >6.4% Glycemic control for   <7.0% adults with diabetes   01/25/2019 04:03 AM 11.4 (H) 4.8 - 5.6 % Final    Comment:    (NOTE) Pre diabetes:          5.7%-6.4% Diabetes:              >6.4% Glycemic control for   <7.0% adults with diabetes     CBG: Recent Labs  Lab 12/26/19 1225 12/26/19 1533 12/26/19 2312 12/27/19 0610 12/27/19 1610  GLUCAP 225* 326* 258* 180* 195*

## 2019-12-27 NOTE — Progress Notes (Signed)
Patient ID: Gary Hudson, male   DOB: January 28, 1966, 54 y.o.   MRN: [id number]          [facility]    Date of Admission:  12/24/2019     [name] is out of his room for swallowing study today.  His nurse reports that he is still having headache.  Repeat bronchoscopy was performed yesterday.  BAL fluid analyses showed predominant monocytes.  No organisms were seen on Gram stain.  Cytology, AFB and fungal stains and cultures are all pending.  I recommend continued observation off of antibiotics patient neurosurgical evaluation for his recently discovered cerebellar mass.         [name], MD [facility] [phone] pager   [phone] 12/27/2019, 9:08 AM

## 2019-12-27 NOTE — Progress Notes (Signed)
Subjective:  O/N Events: No acute ON events Mr. Sleeth was seen at bedside this morning. Feels sleepy this morning. Denies nausea, visual changes, shortness of breath. Headache is still persistent and fairly unchanged. Made aware that we increased the dilaudid dose to hopefully get better control of his pain. He has not had a bowel movement thus far, and would like something for a bowel movement. He was briefed on waiting for his laboratory results, to which he voiced understanding. All questions and concerns were addressed.   Objective:  Vital signs in last 24 hours: Vitals:   12/26/19 1053 12/26/19 1536 12/26/19 2313 12/27/19 0728  BP: 125/89 110/62 (!) 111/58 119/69  Pulse:  80 72 (!) 59  Resp: (!) _0 Temp:  97.8 F (36.6 C) 97.7 F (36.5 C) 97.6 F (36.4 C)  TempSrc:      SpO2: 98% 99% 93% 94%  Weight:      Height:       Physical Exam Vitals and nursing note reviewed.  Constitutional:      General: He is not in acute distress.    Appearance: Normal appearance. He is not ill-appearing or toxic-appearing.  HENT:     Head: Normocephalic and atraumatic.  Eyes:     General:        Right eye: No discharge.        Left eye: No discharge.     Conjunctiva/sclera: Conjunctivae normal.  Cardiovascular:     Rate and Rhythm: Normal rate and regular rhythm.     Pulses: Normal pulses.     Heart sounds: Normal heart sounds. No murmur. No friction rub. No gallop.   Pulmonary:     Effort: Pulmonary effort is normal.     Breath sounds: Normal breath sounds. No wheezing, rhonchi or rales.  Abdominal:     General: Bowel sounds are normal.     Palpations: Abdomen is soft.     Tenderness: There is no abdominal tenderness. There is no guarding.  Musculoskeletal:        General: No swelling.     Right lower leg: No edema.     Left lower leg: No edema.  Neurological:     General: No focal deficit present.     Mental Status: He is alert and oriented to person, place, and time.      CBC Latest Ref Rng & Units 12/25/2019 12/24/2019 01/30/2019  WBC 4.0 - 10.5 K/uL 11.1(H) 12.7(H) 10.0  Hemoglobin 13.0 - 17.0 g/dL 11.2(L) 12.3(L) 10.3(L)  Hematocrit 39.0 - 52.0 % 35.7(L) 38.9(L) 32.2(L)  Platelets 150 - 400 K/uL 643(H) 645(H) 860(H)   CMP Latest Ref Rng & Units 12/25/2019 12/24/2019 01/30/2019  Glucose 70 - 99 mg/dL 160(H) 204(H) 160(H)  BUN 6 - 20 mg/dL 10 14 <5(L)  Creatinine 0.61 - 1.24 mg/dL 0.66 0.68 0.59(L)  Sodium 135 - 145 mmol/L 134(L) 132(L) 134(L)  Potassium 3.5 - 5.1 mmol/L 4.2 4.5 4.3  Chloride 98 - 111 mmol/L 96(L) 91(L) 100  CO2 22 - 32 mmol/L _1 Calcium 8.9 - 10.3 mg/dL 9.1 9.7 8.9  Total Protein 6.5 - 8.1 g/dL 7.1 8.2(H) -  Total Bilirubin 0.3 - 1.2 mg/dL 0.4 0.7 -  Alkaline Phos 38 - 126 U/L 429(H) 471(H) -  AST 15 - 41 U/L 15 15 -  ALT 0 - 44 U/L 19 21 -   CBG (last 3)  Recent Labs    12/26/19 2312 12/27/19 0610 12/27/19  [place]   Echo Complete:  1. Left ventricular ejection fraction, by estimation, is 60 to 65%. The  left ventricle has normal function.The left ventricle has no regional  wall motion abnormalities. Left ventricular diastolic parameters were  normal.  2. Right ventricular systolic function is normal. The right ventricular  size is mildly enlarged.  3. Left atrial size was mildly dilated.  4. Right atrial size was mildly dilated.  5. The mitral valve is normal in structure. Trivial mitral valve  regurgitation. No evidence of mitral stenosis.  6. The aortic valve has an indeterminant number of cusps. Aortic valve  regurgitation is not visualized. No aortic stenosis is present.  7. The inferior vena cava is normal in size with <50% respiratory  variability, suggesting right atrial pressure of 8 mmHg.  Assessment/Plan:  Active Problems:   Uncontrolled type 2 diabetes mellitus with hyperglycemia (HCC)   Essential hypertension   Status post thoracotomy   Poor dentition   Cavitary lesion of  lung   History of pleural effusion   Dyslipidemia   Cerebellar mass   Hypothyroid   Normocytic anemia   Unintentional weight loss   Hemoptysis   Dysphagia  Persistent Headaches Right cerebellar Cystic Mass: Likely 2/2 to persistent headaches related to cerebellar mass with mass effect.   - Neurosurgery recommends outpatient follow up.   - Continue decadron 48m q6h PRN  - Dilaudid 0.5-1 mg Q4H for headache. - Reconsider neurosurgery re-consultation pending bronchoscopy results.  - MRI Brain ordered.   Cavitary Lung Disease Pt with known pulmonary cavitary lesions of the left lower lobe and numerous cavitary lesions in the bilateral lung. Current every day smoker. Continues to experience hemoptysis, weight loss, and intermittent dyspnea. Overall picture at that time concerning for malignancy vs disseminated fungal/bacterial infection. Completed bronchoalveolar lavage on 12/26/2019.  - Pulmonary consulted, we appreciate their recommendations.  - Infectious Disease consulted and following. - OT recommends 3 in 1 bedside commode - Body Fluid: Colorless, clear, monocyte-macrophage-serous Fluid 93 (50-90%) - Acid Fast Culture: Pending - Acid Fast smear: Pending - Gram Stain: Pending - Anaerobic culture: Pending - Cytology: Pending - Fungitell: Active - Aspergillus Ag: Active  Dysphagia:  - Barium swallow today to r/o silent aspiration.   Type II Diabetes - uncontrolled A1c 13.5 on admission. Pt on glimepiride 47mdaily and metformin 10008mID at home.  - Due to scheduled Decadron, changed SSI to resistant. - 10U Lantus started   Hypertension: - Losartan 25m54m - HCZ 12.5mg 23m Hypothyroidism - Levothyroxine 25mcg21m Prior to Admission Living Arrangement: Home Anticipated Discharge Location: Home Barriers to Discharge: Continued Medical workup Dispo: Anticipated discharge in approximately 2-3 days   WinterMaudie Mercury/12/2019, 10:52 AM Pager: [phone]

## 2019-12-27 NOTE — Progress Notes (Signed)
Modified Barium Swallow Progress Note  Patient Details  Name: Gary Hudson MRN: [id number] Date of Birth: Mar 03, 1966  Today's Date: 12/27/2019  Modified Barium Swallow completed.  Full report located under Chart Review in the Imaging Section.  Brief recommendations include the following:  Clinical Impression [name] was seen under videofluoroscopy with thin liquids via spoon/cup/straw, nectar thick liquids via cup, honey thick liquids via cup, purees, and regular solids. Pt demonstrates a moderate pharyngeal dysphagia. Deficits include reduced hyoid excursion, laryngeal elevation, and subsequent epiglottic inversion and UES opening. These deficits resulted in moderate amounts of residue t/o pharynx, specifically with thickened liquids, purees and solids in the valleculae and pyriform sinus. Residue was noted to penetrate and remain in trace amounts d/t UES squeeze pushing residue into laryngeal vestibule. Penetration also occurred before the swallow with thin, nectar, and honey thick liquids. Aspiration in trace amounts was noted with both nectar thick and thin and pt demonstrated no efforts to clear (reduced sensation). Given use of strategies (chin tuck, throat clear) pt was able to effectively maintain clear airway with thin liquids. Pt's cognition is adequate for use of strategies, however, he does have a long history of non-compliance. Pt would likely benefit from further education on need and use of strategies, with family present if able.  Recommendations: thin liquids with chin tuck (throat clear after the swallow), regular solids, small bites/sips, alternate liquids/solids to clear residue, follow up with ENT regarding vocal fold paralysis   Swallow Evaluation Recommendations   Recommended Consults: Consider ENT evaluation   SLP Diet Recommendations: Regular solids;Thin liquid   Liquid Administration via: Straw;Cup   Medication Administration: Whole meds with puree   Supervision: Intermittent supervision to cue for compensatory strategies   Compensations: Small sips/bites;Chin tuck;Clear throat after each swallow       Oral Care Recommendations: Oral care BID     Sophira Rumler P. Keven Osborn, M.S., CCC-SLP Speech-Language Pathologist Acute Rehabilitation Services Pager: [place] 12/27/2019,10:15 AM

## 2019-12-27 NOTE — Progress Notes (Signed)
Brought PT to MRI. PT refused to undergo MRI without general anesthesia. Sent PT back to room.

## 2019-12-28 ENCOUNTER — Inpatient Hospital Stay (HOSPITAL_COMMUNITY): Payer: 59

## 2019-12-28 DIAGNOSIS — J984 Other disorders of lung: Secondary | ICD-10-CM | POA: Diagnosis not present

## 2019-12-28 DIAGNOSIS — G9389 Other specified disorders of brain: Secondary | ICD-10-CM | POA: Diagnosis not present

## 2019-12-28 LAB — BASIC METABOLIC PANEL
Anion gap: 13 (ref 5–15)
BUN: 19 mg/dL (ref 6–20)
CO2: 28 mmol/L (ref 22–32)
Calcium: 9.2 mg/dL (ref 8.9–10.3)
Chloride: 91 mmol/L — ABNORMAL LOW (ref 98–111)
Creatinine, Ser: 0.7 mg/dL (ref 0.61–1.24)
GFR calc Af Amer: >60 mL/min (ref >60)
GFR calc non Af Amer: >60 mL/min (ref >60)
Glucose, Bld: 208 mg/dL — ABNORMAL HIGH (ref 70–99)
Potassium: 4.3 mmol/L (ref 3.5–5.1)
Sodium: 132 mmol/L — ABNORMAL LOW (ref 135–145)

## 2019-12-28 LAB — CBC
HCT: 37.3 % — ABNORMAL LOW (ref 39.0–52.0)
Hemoglobin: 11.8 g/dL — ABNORMAL LOW (ref 13.0–17.0)
MCH: 26.5 pg (ref 26.0–34.0)
MCHC: 31.6 g/dL (ref 30.0–36.0)
MCV: 83.8 fL (ref 80.0–100.0)
Platelets: 819 10*3/uL — ABNORMAL HIGH (ref 150–400)
RBC: 4.45 MIL/uL (ref 4.22–5.81)
RDW: 13.6 % (ref 11.5–15.5)
WBC: 15.3 10*3/uL — ABNORMAL HIGH (ref 4.0–10.5)
nRBC: 0 % (ref 0.0–0.2)

## 2019-12-28 LAB — GLUCOSE, CAPILLARY
Glucose-Capillary: 186 mg/dL — ABNORMAL HIGH (ref 70–99)
Glucose-Capillary: 194 mg/dL — ABNORMAL HIGH (ref 70–99)
Glucose-Capillary: 219 mg/dL — ABNORMAL HIGH (ref 70–99)
Glucose-Capillary: 228 mg/dL — ABNORMAL HIGH (ref 70–99)
Glucose-Capillary: 284 mg/dL — ABNORMAL HIGH (ref 70–99)
Glucose-Capillary: 338 mg/dL — ABNORMAL HIGH (ref 70–99)
Glucose-Capillary: 365 mg/dL — ABNORMAL HIGH (ref 70–99)

## 2019-12-28 LAB — CYTOLOGY - NON PAP

## 2019-12-28 MED ORDER — INSULIN GLARGINE 100 UNIT/ML ~~LOC~~ SOLN
12.0000 [IU] | Freq: Every day | SUBCUTANEOUS | Status: DC
  Administered 2019-12-28: 12 [IU] via SUBCUTANEOUS
  Filled 2019-12-28 (×2): qty 0.12

## 2019-12-28 MED ORDER — LORAZEPAM 2 MG/ML IJ SOLN
1.0000 mg | Freq: Once | INTRAMUSCULAR | Status: AC
  Administered 2019-12-28: 1 mg via INTRAVENOUS
  Filled 2019-12-28: qty 1

## 2019-12-28 MED ORDER — GADOBUTROL 1 MMOL/ML IV SOLN
7.0000 mL | Freq: Once | INTRAVENOUS | Status: AC | PRN
  Administered 2019-12-28: 7.5 mL via INTRAVENOUS

## 2019-12-28 NOTE — Progress Notes (Signed)
PT Cancellation Note  Patient Details Name: Gary Hudson MRN: [id number] DOB: August 10, 1966   Cancelled Treatment:    Reason Eval/Treat Not Completed: Patient at procedure or test/unavailable - will check back as schedule allows.  [place] Pager [phone]  Office [place] 12/28/2019, 2:53 PM

## 2019-12-28 NOTE — H&P (Signed)
Chief Complaint: Patient was seen in consultation today for  Chief Complaint  Patient presents with  . Shortness of Breath   at the request of Dr. Elsworth Soho, Pulmonary.  Supervising Physician: Jacqulynn Cadet  Patient Status: [facility] - In-pt  History of Present Illness: Gary Hudson is a 54 y.o. male who presented to the [facility] ED 12/24/2019 with a headache and shortness of breath. Past medical history is significant for a 60+ pack year smoking history, DM2, and a loculated left empyema with cavitary lung lesion in the left lower lobe s/p VATS in 01/2019 (cultures negative). 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 Extensive work up for TB and malignancy including EBUS and biopsy were all negative. Prior lab work up including fungal, acid fast stains, urine strep pneumo antigen, ANA, ACE level, RF, Legionella antigen and ANCA testing were all negative.  CTA of the chest in the ED 12/24/19: 1. No pulmonary embolus. 2. Known cavitary process throughout the lungs, with dominant left lower lobe cavitary lesion, similar to CT last month. Patient recently underwent bronchoscopy 11/30/2019 at an outside institution. Recommend correlation with prior bronchoscopy results. Imaging findings may be infectious or neoplastic. 3. Additional cavitary lesions throughout both lungs, also seen on prior, majority of which are stable. There is however a new/progressive left upper lobe cavitary lesion that measures 3.5 x 2.9 cm with surrounding consolidation. 4. Slight progression in mediastinal and hilar adenopathy.   IR has been asked to consult on this case and to perform a CT-guided biopsy of left lower lobe cavitating mass. Imaging has been reviewed by Dr. Laurence Ferrari.   Past Medical History:  Diagnosis Date    . Diabetes mellitus without complication [facility])     Past Surgical History:  Procedure Laterality Date  . BRONCHIAL BRUSHINGS  12/26/2019   Procedure: BRONCHIAL BRUSHINGS;  Surgeon: Spero Geralds, MD;  Location: [place];  Service: Pulmonary;;  . BRONCHIAL WASHINGS  12/26/2019   Procedure: BRONCHIAL WASHINGS;  Surgeon: Spero Geralds, MD;  Location: [facility] ENDOSCOPY;  Service: Pulmonary;;  . TEE WITHOUT CARDIOVERSION N/A 01/26/2019   Procedure: TRANSESOPHAGEAL ECHOCARDIOGRAM (TEE);  Surgeon: Prescott Gum, Collier Salina, MD;  Location: [facility] OR;  Service: Thoracic;  Laterality: N/A;  . VIDEO ASSISTED THORACOSCOPY (VATS)/EMPYEMA Left 01/26/2019   Procedure: VIDEO ASSISTED THORACOSCOPY (VATS)/DRAINAGE OF EMPYEMA  AND DECORTICATION;  Surgeon: Ivin Poot, MD;  Location: [place];  Service: Thoracic;  Laterality: Left;  Marland Kitchen VIDEO BRONCHOSCOPY N/A 01/26/2019   Procedure: VIDEO BRONCHOSCOPY;  Surgeon: Prescott Gum, Collier Salina, MD;  Location: [place];  Service: Thoracic;  Laterality: N/A;  . VIDEO BRONCHOSCOPY N/A 12/26/2019   Procedure: VIDEO BRONCHOSCOPY WITHOUT FLUORO;  Surgeon: Spero Geralds, MD;  Location: [facility] ENDOSCOPY;  Service: Pulmonary;  Laterality: N/A;    Allergies: Patient has no known allergies.  Medications: Prior to Admission medications   Medication Sig Start Date End Date Taking? Authorizing Provider  Aspirin-Acetaminophen-Caffeine (GOODY HEADACHE PO) Take 1 Package by mouth every 4 (four) hours as needed (headache).   Yes [provider]  atorvastatin (LIPITOR) 20 MG tablet Take 20 mg by mouth daily.  04/13/17  Yes [provider]  gabapentin (NEURONTIN) 300 MG capsule Take 1 capsule (300 mg total) by mouth 3 (three) times daily. As needed for incisional pain 03/02/19  Yes  Prescott Gum, Collier Salina, MD  glimepiride (AMARYL) 4 MG tablet Take 4 mg by mouth daily with breakfast.  04/13/17  Yes [provider]  hydrochlorothiazide (MICROZIDE) 12.5 MG capsule Take 12.5 mg by mouth daily. 11/02/19   Yes [provider]  LANTUS SOLOSTAR 100 UNIT/ML Solostar Pen Inject 30 Units into the skin daily.  12/11/19  Yes [provider]  levothyroxine (SYNTHROID, LEVOTHROID) 25 MCG tablet Take 25 mcg by mouth daily before breakfast.  04/13/17  Yes [provider]  metFORMIN (GLUCOPHAGE) 500 MG tablet Take 500-1,000 mg by mouth See admin instructions. 1000mg  in the morning and 500mg  in the evening 04/13/17  Yes [provider]  traZODone (DESYREL) 50 MG tablet Take 50 mg by mouth at bedtime. 09/22/19  Yes [provider]  VENTOLIN HFA 108 (90 Base) MCG/ACT inhaler Inhale 2 puffs into the lungs every 6 (six) hours as needed for wheezing or shortness of breath. 12/11/19  Yes [provider]  losartan-hydrochlorothiazide (HYZAAR) 50-12.5 MG tablet Take 1 tablet by mouth daily.  04/13/17   [provider]     Family History  Problem Relation Age of Onset  . CAD Father   . Diabetes Mellitus II Neg Hx     Social History   Socioeconomic History  . Marital status: Single    Spouse name: Not on file  . Number of children: Not on file  . Years of education: Not on file  . Highest education level: Not on file  Occupational History  . Not on file  Tobacco Use  . Smoking status: Former Research scientist (life sciences)  . Smokeless tobacco: Never Used  Substance and Sexual Activity  . Alcohol use: Never  . Drug use: Never  . Sexual activity: Not on file  Other Topics Concern  . Not on file  Social History Narrative  . Not on file   Social Determinants of Health   Financial Resource Strain:   . Difficulty of Paying Living Expenses:   Food Insecurity:   . Worried About Charity fundraiser in the Last Year:   . Arboriculturist in the Last Year:   Transportation Needs:   . Film/video editor (Medical):   Marland Kitchen Lack of Transportation (Non-Medical):   Physical Activity:   . Days of Exercise per Week:   . Minutes of Exercise per Session:   Stress:   . Feeling of  Stress :   Social Connections:   . Frequency of Communication with Friends and Family:   . Frequency of Social Gatherings with Friends and Family:   . Attends Religious Services:   . Active Member of Clubs or Organizations:   . Attends Archivist Meetings:   Marland Kitchen Marital Status:     Review of Systems: A 12 point ROS discussed and pertinent positives are indicated in the HPI above.  All other systems are negative.  Review of Systems  Constitutional: Positive for appetite change, fatigue and unexpected weight change.  Respiratory: Positive for shortness of breath.        With activity  Cardiovascular: Negative for chest pain and leg swelling.  Gastrointestinal: Negative for abdominal distention, abdominal pain, diarrhea and nausea.  Musculoskeletal: Negative for back pain.  Neurological: Positive for headaches. Negative for dizziness and light-headedness.    Vital Signs: BP 115/64 (BP Location: Right Arm)   Pulse 64   Temp 97.7 F (36.5 C)   Resp 16   Ht 5\' 11"  (1.803 m)   Wt 164  lb 0.4 oz (74.4 kg)   SpO2 97%   BMI 22.88 kg/m   Physical Exam Constitutional:      General: He is not in acute distress. Cardiovascular:     Rate and Rhythm: Normal rate and regular rhythm.  Pulmonary:     Effort: Pulmonary effort is normal.     Breath sounds: Decreased breath sounds present. No wheezing or rhonchi.  Abdominal:     General: Bowel sounds are normal.     Palpations: Abdomen is soft.     Tenderness: There is no abdominal tenderness.  Skin:    General: Skin is warm and dry.  Neurological:     Mental Status: He is alert and oriented to person, place, and time.     Imaging: DG Chest 2 View  Result Date: 12/24/2019 CLINICAL DATA:  Shortness of breath EXAM: CHEST - 2 VIEW COMPARISON:  Chest radiographs and CT, 11/27/2019 FINDINGS: The heart size and mediastinal contours are within normal limits. Redemonstrated heterogeneous opacity and multiple cavitary lesions of the  left lower lobe. The visualized skeletal structures are unremarkable. IMPRESSION: Redemonstrated heterogeneous opacity and multiple cavitary lesions of the left lower lobe, better assessed by prior CT. No new airspace opacity. Electronically Signed   By: Eddie Candle M.D.   On: 12/24/2019 16:03   CT Head Wo Contrast  Result Date: 12/24/2019 CLINICAL DATA:  Headache. Cerebellar mass EXAM: CT HEAD WITHOUT CONTRAST TECHNIQUE: Contiguous axial images were obtained from the base of the skull through the vertex without intravenous contrast. COMPARISON:  None. FINDINGS: Brain: Hypodense mass of the right cerebellar hemisphere measures 4.2 x 3.3 cm. There is mass effect on the fourth ventricle and distal cerebral aqueduct. No hydrocephalus. No hemorrhage or extra-axial collection. Vascular: No hyperdense vessel or unexpected calcification. Skull: Normal Sinuses/Orbits: Clear sinuses and mastoids. Normal orbits. Other: None. IMPRESSION: Hypodense mass of the right cerebellar hemisphere with mass effect on the fourth ventricle and distal cerebral aqueduct. No hydrocephalus or hemorrhage. Electronically Signed   By: Ulyses Jarred M.D.   On: 12/24/2019 20:03   CT Angio Chest PE W and/or Wo Contrast  Result Date: 12/24/2019 CLINICAL DATA:  Shortness of breath. EXAM: CT ANGIOGRAPHY CHEST WITH CONTRAST TECHNIQUE: Multidetector CT imaging of the chest was performed using the standard protocol during bolus administration of intravenous contrast. Multiplanar CT image reconstructions and MIPs were obtained to evaluate the vascular anatomy. CONTRAST:  152mL OMNIPAQUE IOHEXOL 350 MG/ML SOLN COMPARISON:  Radiograph earlier this day. Chest CTA 1 month ago 11/27/2019 at [place]: Cardiovascular: There are no filling defects within the pulmonary arteries to suggest pulmonary embolus. Aortic atherosclerosis. No aneurysm. Heart is normal in size. No pericardial effusion. There are coronary artery calcifications.  Mediastinum/Nodes: Shotty mediastinal and hilar adenopathy, with slight progression from last month. For example right hilar node measures 14 mm, previously 12 mm. No esophageal wall thickening. No thyroid nodule. Lungs/Pleura: Multifocal cavitary lesions throughout both lungs, again seen. Dominant left lower lobe cavitary process in the infrahilar region measures 6 x 3.6 cm, previously 7.6 x 4.8 cm. This abuts the left lower lobe bronchus which is appears occluded. Adjacent peripheral cavitary nodules and consolidation in the left lower lobe. There is a new dominant cavitary process in the left upper lobe measuring 2.9 x 3.5 cm, new from last month. Surrounding consolidation and ground-glass. Small cavitary nodules are present throughout all lobes of both lungs, otherwise grossly stable from prior. Small left pleural effusion. There is central bronchial thickening diffusely.  Upper Abdomen: Ingested material distends the stomach. No acute findings. Musculoskeletal: Sclerosis centered at T7-T8 endplate consistent with Modic endplate changes, also seen on thoracic MRI performed 12/01/2019 at an outside institution. No discrete focal bone lesion. Review of the MIP images confirms the above findings. IMPRESSION: 1. No pulmonary embolus. 2. Known cavitary process throughout the lungs, with dominant left lower lobe cavitary lesion, similar to CT last month. Patient recently underwent bronchoscopy 11/30/2019 at an outside institution. Recommend correlation with prior bronchoscopy results. Imaging findings may be infectious or neoplastic. 3. Additional cavitary lesions throughout both lungs, also seen on prior, majority of which are stable. There is however a new/progressive left upper lobe cavitary lesion that measures 3.5 x 2.9 cm with surrounding consolidation. 4. Slight progression in mediastinal and hilar adenopathy. Electronically Signed   By: Keith Rake M.D.   On: 12/24/2019 19:46   MR BRAIN W WO  CONTRAST  Result Date: 12/28/2019 CLINICAL DATA:  Cerebellar mass. EXAM: MRI HEAD WITHOUT AND WITH CONTRAST TECHNIQUE: Multiplanar, multiecho pulse sequences of the brain and surrounding structures were obtained without and with intravenous contrast. CONTRAST:  7.32mL GADAVIST GADOBUTROL 1 MMOL/ML IV SOLN COMPARISON:  None. FINDINGS: Brain: Large cystic appearing mass lesion is seen centered on the right cerebellar hemisphere with low signal on T1 and increased signal T2 with layering debris and then rim enhancement. Restricted diffusion is noted along the lesion margins with facilitated diffusion within the lesion. It measures approximately 4.5 x 3.9 x 3.2 cm (T, AP, cc) and causes mass effect on the fourth ventricle, pons and medulla. No herniation through the foramen magnum. No hydrocephalus or hemorrhage. No other lesion identified. Vascular: Normal flow voids. Skull and upper cervical spine: Normal marrow signal. Sinuses/Orbits: Mucous retention cyst in the left frontal sinus and mucosal thickening of the left ethmoid cells. Orbits are maintained. Other: None. IMPRESSION: Large cystic appearing mass centered on the right cerebellar hemisphere measuring approximately 4.5 x 3.9 x 3.2 cm with layering debris and thin rim enhancement. Given absence of intralesional restricted diffusion, findings favor metastasis over abscess. No hydrocephalus. Electronically Signed   By: Pedro Earls M.D.   On: 12/28/2019 15:32   DG Swallowing Func-Speech Pathology  Result Date: 12/27/2019 Objective Swallowing Evaluation: Type of Study: MBS-Modified Barium Swallow Study  Patient Details Name: Gary Hudson MRN: [id number] Date of Birth: [birth year] Today's Date: 12/27/2019 Time: SLP Start Time (ACUTE ONLY): 0910 -SLP Stop Time (ACUTE ONLY): 0930 SLP Time Calculation (min) (ACUTE ONLY): 20 min Past Medical History: Past Medical History: Diagnosis Date . Diabetes mellitus without complication [facility])  Past Surgical  History: Past Surgical History: Procedure Laterality Date . BRONCHIAL BRUSHINGS  12/26/2019  Procedure: BRONCHIAL BRUSHINGS;  Surgeon: Spero Geralds, MD;  Location: [place];  Service: Pulmonary;; . BRONCHIAL WASHINGS  12/26/2019  Procedure: BRONCHIAL WASHINGS;  Surgeon: Spero Geralds, MD;  Location: [facility] ENDOSCOPY;  Service: Pulmonary;; . TEE WITHOUT CARDIOVERSION N/A 01/26/2019  Procedure: TRANSESOPHAGEAL ECHOCARDIOGRAM (TEE);  Surgeon: Prescott Gum, Collier Salina, MD;  Location: [facility] OR;  Service: Thoracic;  Laterality: N/A; . VIDEO ASSISTED THORACOSCOPY (VATS)/EMPYEMA Left 01/26/2019  Procedure: VIDEO ASSISTED THORACOSCOPY (VATS)/DRAINAGE OF EMPYEMA  AND DECORTICATION;  Surgeon: Ivin Poot, MD;  Location: [place];  Service: Thoracic;  Laterality: Left; Marland Kitchen VIDEO BRONCHOSCOPY N/A 01/26/2019  Procedure: VIDEO BRONCHOSCOPY;  Surgeon: Prescott Gum, Collier Salina, MD;  Location: [place];  Service: Thoracic;  Laterality: N/A; . VIDEO BRONCHOSCOPY N/A 12/26/2019  Procedure: VIDEO BRONCHOSCOPY WITHOUT FLUORO;  Surgeon: Shearon Stalls,  Senaida Ores, MD;  Location: [place] ENDOSCOPY;  Service: Pulmonary;  Laterality: N/A; HPI: Mr. Stafford is a 54 year old M with significant PMH of loculated left empyema with cavitary lung lesion in the left lower lobe s/p VATS in 01/2019, significant tobacco use, type II diabetes, hypertension, and hypothyroidism. He presents to the ED today for intractable headaches. Of note, he had a recent hospitalization 1 month ago after presenting for hemoptysis and weight loss and was found to have enlargement of his prior cavitary lung lesions with numerous bilateral smaller lesions and right cerebellar cystic mass. 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 Pt denies ever having seen ENT.  Subjective: Pt upright in chair; cooperative for test. Assessment / Plan / Recommendation CHL IP CLINICAL IMPRESSIONS 12/27/2019 Clinical Impression Mr.  Cybulski was seen under videofluoroscopy with thin liquids via spoon/cup/straw, nectar thick liquids via cup, honey thick liquids via cup, purees, and regular solids. Pt demonstrates a moderate pharyngeal dysphagia. Deficits include reduced hyoid excursion, laryngeal elevation, and subsequent epiglottic inversion and UES opening. These deficits resulted in moderate amounts of residue t/o pharynx, specifically with thickened liquids, purees and solids in the valleculae and pyriform sinus. Residue was noted to penetrate and remain in trace amounts d/t UES squeeze pushing residue into laryngeal vestibule. Penetration also occurred before the swallow with thin, nectar, and honey thick liquids. Aspiration in trace amounts was noted with both nectar thick and thin and pt demonstrated no efforts to clear (reduced sensation). Given use of strategies (chin tuck, throat clear) pt was able to effectively maintain clear airway with thin liquids. Pt's cognition is adequate for use of strategies, however, he does have a long history of non-compliance. Pt would likely benefit from further education on need and use of strategies, with family present if able. Recommendations: thin liquids with chin tuck (throat clear after the swallow), regular solids, small bites/sips, alternate liquids/solids to clear residue, follow up with ENT regarding vocal fold paralysis SLP Visit Diagnosis Dysphagia, pharyngeal phase (R13.13) Impact on safety and function Moderate aspiration risk   CHL IP TREATMENT RECOMMENDATION 12/27/2019 Treatment Recommendations Therapy as outlined in treatment plan below   Prognosis 12/27/2019 Prognosis for Safe Diet Advancement Guarded Barriers to Reach Goals Other (Comment) Barriers/Prognosis Comment Guarded based on h/o non-compliance and medical prognosis given cerebellar mass CHL IP DIET RECOMMENDATION 12/27/2019 SLP Diet Recommendations Regular solids;Thin liquid Liquid Administration via Straw;Cup Medication  Administration Whole meds with puree Compensations Small sips/bites;Chin tuck;Clear throat after each swallow Postural Changes Chin tuck in upright position   CHL IP OTHER RECOMMENDATIONS 12/27/2019 Recommended Consults Consider ENT evaluation Oral Care Recommendations Oral care BID Other Recommendations    CHL IP FOLLOW UP RECOMMENDATIONS 12/27/2019 Follow up Recommendations Outpatient SLP;Home health SLP   CHL IP FREQUENCY AND DURATION 12/27/2019 Speech Therapy Frequency (ACUTE ONLY) min 1 x/week Treatment Duration 2 weeks      CHL IP ORAL PHASE 12/27/2019 Oral Phase WFL Oral - Honey Cup WFL Oral - Nectar Cup WFL Oral - Thin Teaspoon WFL Oral - Thin Cup WFL Oral - Thin Straw WFL Oral - Puree WFL Oral - Regular WFL Oral Phase - Comment Poor dentition, not impacting mastication  CHL IP PHARYNGEAL PHASE 12/27/2019 Pharyngeal Phase Impaired Pharyngeal- Honey Cup Penetration/Apiration after swallow;Pharyngeal residue - cp segment;Pharyngeal residue - pyriform;Pharyngeal residue - valleculae Pharyngeal- Nectar Cup Trace aspiration;Reduced epiglottic inversion;Reduced laryngeal elevation;Reduced airway/laryngeal closure Pharyngeal- Thin Teaspoon Reduced  anterior laryngeal mobility;Reduced epiglottic inversion;Reduced airway/laryngeal closure;Reduced laryngeal elevation;Pharyngeal residue - valleculae;Pharyngeal residue - pyriform;Pharyngeal residue - cp segment Pharyngeal- Thin Cup Trace aspiration;Penetration/Apiration after swallow;Penetration/Aspiration before swallow;Reduced epiglottic inversion;Reduced anterior laryngeal mobility;Reduced laryngeal elevation;Reduced airway/laryngeal closure Pharyngeal- Thin Straw Reduced epiglottic inversion;Reduced anterior laryngeal mobility;Penetration/Apiration after swallow;Penetration/Aspiration before swallow;Reduced airway/laryngeal closure;Reduced laryngeal elevation Pharyngeal- Puree Pharyngeal residue - valleculae;Pharyngeal residue - pyriform;Pharyngeal residue - cp segment  Pharyngeal- Regular Pharyngeal residue - pyriform;Pharyngeal residue - valleculae;Pharyngeal residue - posterior pharnyx;Reduced epiglottic inversion;Reduced pharyngeal peristalsis;Reduced laryngeal elevation;Reduced anterior laryngeal mobility  CHL IP CERVICAL ESOPHAGEAL PHASE 12/27/2019 Cervical Esophageal Phase Impaired Honey Cup Reduced cricopharyngeal relaxation Nectar Cup Reduced cricopharyngeal relaxation Thin Cup Reduced cricopharyngeal relaxation Thin Straw Reduced cricopharyngeal relaxation Puree Reduced cricopharyngeal relaxation Regular Reduced cricopharyngeal relaxation Madison P. Isenhour, M.S., CCC-SLP Speech-Language Pathologist Acute Rehabilitation Services Pager: [place] 12/27/2019, 9:47 AM              ECHOCARDIOGRAM COMPLETE  Result Date: 12/26/2019    ECHOCARDIOGRAM REPORT   Patient Name:   Gary Hudson Date of Exam: 12/26/2019 Medical Rec #:  [id number]       Height:       71.0 in Accession #:    [id number]      Weight:       164.0 lb Date of Birth:  [birth year]       BSA:          1.938 m Patient Age:    62 years        BP:           125/89 mmHg Patient Gender: M               HR:           72 bpm. Exam Location:  Inpatient Procedure: 2D Echo Indications:    lung abnormality  History:        Patient has prior history of Echocardiogram examinations, most                 recent 01/26/2019. Risk Factors:Diabetes and Former Smoker.  Sonographer:    Jannett Celestine RDCS (AE) Referring Phys: [id number] CAROLYN GUILLOUD IMPRESSIONS  1. Left ventricular ejection fraction, by estimation, is 60 to 65%. The left ventricle has normal function. The left ventricle has no regional wall motion abnormalities. Left ventricular diastolic parameters were normal.  2. Right ventricular systolic function is normal. The right ventricular size is mildly enlarged.  3. Left atrial size was mildly dilated.  4. Right atrial size was mildly dilated.  5. The mitral valve is normal in structure. Trivial mitral  valve regurgitation. No evidence of mitral stenosis.  6. The aortic valve has an indeterminant number of cusps. Aortic valve regurgitation is not visualized. No aortic stenosis is present.  7. The inferior vena cava is normal in size with <50% respiratory variability, suggesting right atrial pressure of 8 mmHg. FINDINGS  Left Ventricle: Left ventricular ejection fraction, by estimation, is 60 to 65%. The left ventricle has normal function. The left ventricle has no regional wall motion abnormalities. The left ventricular internal cavity size was normal in size. There is  no left ventricular hypertrophy. Left ventricular diastolic parameters were normal. Right Ventricle: The right ventricular size is mildly enlarged. No increase in right ventricular wall thickness. Right ventricular systolic function is normal. Left Atrium: Left atrial size was mildly dilated. Right Atrium: Right atrial size was mildly dilated. Pericardium: Trivial pericardial effusion is present. Mitral Valve: The mitral valve is  normal in structure. Trivial mitral valve regurgitation. No evidence of mitral valve stenosis. Tricuspid Valve: The tricuspid valve is normal in structure. Tricuspid valve regurgitation is trivial. No evidence of tricuspid stenosis. Aortic Valve: The aortic valve has an indeterminant number of cusps. Aortic valve regurgitation is not visualized. No aortic stenosis is present. There is mild calcification of the aortic valve. Pulmonic Valve: The pulmonic valve was not well visualized. Pulmonic valve regurgitation is not visualized. No evidence of pulmonic stenosis. Aorta: The aortic arch was not well visualized, the ascending aorta was not well visualized and the aortic root was not well visualized. Pulmonary Artery: The pulmonary artery is not well seen. Venous: The inferior vena cava is normal in size with less than 50% respiratory variability, suggesting right atrial pressure of 8 mmHg. IAS/Shunts: No atrial level shunt  detected by color flow Doppler.  LEFT VENTRICLE PLAX 2D LVIDd:         4.50 cm  Diastology LVIDs:         2.90 cm  LV e' lateral:   10.30 cm/s LV PW:         1.00 cm  LV E/e' lateral: 9.6 LV IVS:        1.10 cm  LV e' medial:    7.07 cm/s LVOT diam:     2.10 cm  LV E/e' medial:  14.0 LV SV:         63 LV SV Index:   33 LVOT Area:     3.46 cm  RIGHT VENTRICLE RV S prime:     12.20 cm/s TAPSE (M-mode): 2.0 cm LEFT ATRIUM             Index       RIGHT ATRIUM           Index LA diam:        3.90 cm 2.01 cm/m  RA Area:     20.90 cm LA Vol (A2C):   61.0 ml 31.47 ml/m RA Volume:   57.70 ml  29.77 ml/m LA Vol (A4C):   71.8 ml 37.05 ml/m LA Biplane Vol: 69.7 ml 35.96 ml/m  AORTIC VALVE LVOT Vmax:   80.20 cm/s LVOT Vmean:  51.900 cm/s LVOT VTI:    0.182 m  AORTA Ao Root diam: 3.30 cm MITRAL VALVE MV Area (PHT): 3.72 cm    SHUNTS MV Decel Time: 204 msec    Systemic VTI:  0.18 m MV E velocity: 99.20 cm/s  Systemic Diam: 2.10 cm MV A velocity: 60.00 cm/s MV E/A ratio:  1.65 Buford Dresser MD Electronically signed by Buford Dresser MD Signature Date/Time: 12/26/2019/5:17:17 PM    Final     Labs:  CBC: Recent Labs    01/30/19 0316 12/24/19 1657 12/25/19 0433 12/28/19 0328  WBC 10.0 12.7* 11.1* 15.3*  HGB 10.3* 12.3* 11.2* 11.8*  HCT 32.2* 38.9* 35.7* 37.3*  PLT 860* 645* 643* 819*    COAGS: Recent Labs    01/23/19 2351 01/24/19 2107  INR 1.2 1.3*  APTT  --  32    BMP: Recent Labs    01/30/19 0316 12/24/19 1657 12/25/19 0433 12/28/19 0328  NA 134* 132* 134* 132*  K 4.3 4.5 4.2 4.3  CL 100 91* 96* 91*  CO2 27 27 26 28   GLUCOSE 160* 204* 160* 208*  BUN <5* 14 10 19   CALCIUM 8.9 9.7 9.1 9.2  CREATININE 0.59* 0.68 0.66 0.70  GFRNONAA >60 >60 >60 >60  GFRAA >60 >60 >60 >60  LIVER FUNCTION TESTS: Recent Labs    01/24/19 0504 01/24/19 0504 01/28/19 0233 01/30/19 0316 12/24/19 1657 12/25/19 0433  BILITOT 1.1  --  0.2*  --  0.7 0.4  AST 19  --  25  --  15 15  ALT  18  --  24  --  21 19  ALKPHOS 176*  --  156*  --  471* 429*  PROT 6.2*  --  5.4*  --  8.2* 7.1  ALBUMIN 1.8*   < > 1.7* 2.1* 3.0* 2.7*   < > = values in this interval not displayed.    TUMOR MARKERS: No results for input(s): AFPTM, CEA, CA199, CHROMGRNA in the last 8760 hours.  Assessment and Plan: Gary Hudson is a 54 year old male with a history of a loculated left empyema with cavitary lung lesion in the left lower lobe s/p VATS in 01/2019. 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   Extensive work up has been negative for infection and malignancy. Patient now admitted to [facility] for shortness of breath and headache. CTA on 12/24/2019 demonstrated findings consistent with his known history in addition to a new/progressive left upper lobe cavitary lesion that measures 3.5 x 2.9 cm with surrounding consolidation.   IR will proceed with a CT-guided biopsy of left lower cavitary lesion on 5/7/202.   Risks and benefits of of a CT-guided biopsy of left lower cavitary lesion was discussed with the patient and/or patient's family including, but not limited to bleeding, infection, damage to adjacent structures or low yield requiring additional tests. Patient also aware of the risk of a pneumothorax and the potential for chest tube placement.   All of the questions were answered and there is agreement to proceed.  Consent signed and in chart.  Thank you for this interesting consult.  I greatly enjoyed meeting Gary Hudson and look forward to participating in their care.  A copy of this report was sent to the requesting provider on this date.  Electronically Signed: Theresa Duty, NP 12/28/2019, 4:37 PM   I spent a total of 20 Minutes  in face to face in clinical consultation, greater than  50% of which was counseling/coordinating care for biopsy of left cavitary pulmonary lesion.

## 2019-12-28 NOTE — Progress Notes (Signed)
Pt refuses Ensure

## 2019-12-28 NOTE — Progress Notes (Signed)
Subjective:  O/N Events: Patient refused MRI without anesthetic   Seen at bedside this morning. Patient states that he did not proceed with the MRI yesterday, due to wanting to be placed under general anesthesia. We discussed that due to his current pulmonary disease, we should try medications to keep him calm while undergoing the MRI. Patient and family were amendable to the plan.   Objective:  Vital signs in last 24 hours: Vitals:   12/26/19 2313 12/27/19 0728 12/27/19 1544 12/27/19 2359  BP: (!) 111/58 119/69 121/70 122/73  Pulse: 72 (!) 59 (!) 57 60  Resp:  _0 Temp: 97.7 F (36.5 C) 97.6 F (36.4 C) 97.6 F (36.4 C) 97.8 F (36.6 C)  TempSrc:    Oral  SpO2: 93% 94% 97% 96%  Weight:      Height:       Physical Exam Vitals and nursing note reviewed.  Constitutional:      General: He is not in acute distress.    Appearance: Normal appearance. He is not ill-appearing or toxic-appearing.  HENT:     Head: Normocephalic and atraumatic.  Eyes:     General:        Right eye: No discharge.        Left eye: No discharge.     Conjunctiva/sclera: Conjunctivae normal.  Cardiovascular:     Rate and Rhythm: Normal rate and regular rhythm.     Pulses: Normal pulses.     Heart sounds: Normal heart sounds. No murmur. No friction rub. No gallop.   Pulmonary:     Effort: Pulmonary effort is normal.     Breath sounds: Normal breath sounds. No wheezing, rhonchi or rales.  Abdominal:     General: Bowel sounds are normal.     Palpations: Abdomen is soft.     Tenderness: There is no abdominal tenderness. There is no guarding.  Musculoskeletal:        General: No swelling.     Right lower leg: No edema.     Left lower leg: No edema.  Neurological:     General: No focal deficit present.     Mental Status: He is alert and oriented to person, place, and time.     Comments: Saccadic movement in L and R movement on EOM.  CN II-XII intact bilaterally.  Psychiatric:        Mood  and Affect: Mood normal.        Behavior: Behavior normal.     CBC Latest Ref Rng & Units 12/28/2019 12/25/2019 12/24/2019  WBC 4.0 - 10.5 K/uL 15.3(H) 11.1(H) 12.7(H)  Hemoglobin 13.0 - 17.0 g/dL 11.8(L) 11.2(L) 12.3(L)  Hematocrit 39.0 - 52.0 % 37.3(L) 35.7(L) 38.9(L)  Platelets 150 - 400 K/uL 819(H) 643(H) 645(H)   CMP Latest Ref Rng & Units 12/28/2019 12/25/2019 12/24/2019  Glucose 70 - 99 mg/dL 208(H) 160(H) 204(H)  BUN 6 - 20 mg/dL _1 Creatinine 0.61 - 1.24 mg/dL 0.70 0.66 0.68  Sodium 135 - 145 mmol/L 132(L) 134(L) 132(L)  Potassium 3.5 - 5.1 mmol/L 4.3 4.2 4.5  Chloride 98 - 111 mmol/L 91(L) 96(L) 91(L)  CO2 22 - 32 mmol/L _2 Calcium 8.9 - 10.3 mg/dL 9.2 9.1 9.7  Total Protein 6.5 - 8.1 g/dL - 7.1 8.2(H)  Total Bilirubin 0.3 - 1.2 mg/dL - 0.4 0.7  Alkaline Phos 38 - 126 U/L - 429(H) 471(H)  AST 15 - 41 U/L - 15 15  ALT 0 - 44 U/L - 19 21   CBG (last 3)  Recent Labs    12/27/19 1926 12/27/19 2358 12/28/19 0357  GLUCAP 238* 228* 194*   Echo Complete:  1. Left ventricular ejection fraction, by estimation, is 60 to 65%. The  left ventricle has normal function.The left ventricle has no regional  wall motion abnormalities. Left ventricular diastolic parameters were  normal.  2. Right ventricular systolic function is normal. The right ventricular  size is mildly enlarged.  3. Left atrial size was mildly dilated.  4. Right atrial size was mildly dilated.  5. The mitral valve is normal in structure. Trivial mitral valve  regurgitation. No evidence of mitral stenosis.  6. The aortic valve has an indeterminant number of cusps. Aortic valve  regurgitation is not visualized. No aortic stenosis is present.  7. The inferior vena cava is normal in size with <50% respiratory  variability, suggesting right atrial pressure of 8 mmHg.  Assessment/Plan:  Active Problems:   Uncontrolled type 2 diabetes mellitus with hyperglycemia (HCC)   Essential hypertension    Status post thoracotomy   Poor dentition   Cavitary lesion of lung   History of pleural effusion   Dyslipidemia   Cerebellar mass   Hypothyroid   Normocytic anemia   Unintentional weight loss   Hemoptysis   Dysphagia  Persistent Headaches Right cerebellar Cystic Mass: Likely 2/2 to persistent headaches related to cerebellar mass with mass effect. Patient refused MRI on 12/27/2019. Initial Lab results are coming back negative, will consult with neurosurgery to further assess Mr. Leverich cerebellar mass. Will repeat MRI Brain w/ativan today.  - Will consult with neurosurgery regarding Mr. Autry cerebellar mass.  - MRI Brain w/ 374m Ativan prior to imaging today.  - Continue decadron [id number] PRN  - Dilaudid 0.5-1 mg Q4H for headache.  Cavitary Lung Disease Pt with known pulmonary cavitary lesions of the left lower lobe and numerous cavitary lesions in the bilateral lung. Current every day smoker. Continues to experience hemoptysis, weight loss, and intermittent dyspnea. Overall picture at that time concerning for malignancy vs disseminated fungal/bacterial infection. Completed bronchoalveolar lavage on 12/26/2019.  - Pulmonary consulted, we appreciate their recommendations.  - Infectious Disease consulted and following. - OT recommends 3 in 1 bedside commode - Body Fluid cell count: Colorless, clear, monocyte-macrophage-serous Fluid 93 (50-90%) - Acid Fast Culture: Pending - Acid Fast smear: Negative - Gram Stain: No WBC seen, no organisms seen - Anaerobic culture: pending - Cytology- Non PAP: No malignant cells identified, Benign reactive/reparative changes - Fungitell: Pending - Aspergillus Ag: Active  Type II Diabetes - uncontrolled A1c 13.5 on admission. Pt on glimepiride 74m80maily and metformin 1000m274mD at home.  - Due to scheduled Decadron, changed SSI to resistant. - Increased Lantus to 12U  Hypertension: - Losartan 50mg69m- HCZ 12.5mg Q71mHypothyroidism -  Levothyroxine 25mcg 274mPrior to Admission Living Arrangement: Home Anticipated Discharge Location: Home Barriers to Discharge: Continued Medical workup Dispo: Anticipated discharge in approximately 2-3 days   WintersMaudie Mercury6/2021, 6:45 AM Pager: [phone]

## 2019-12-28 NOTE — Progress Notes (Signed)
Patient ID: Gary Hudson, male   DOB: 01/17/1966, 54 y.o.   MRN: [id number] BP 121/69 (BP Location: Left Arm)   Pulse (!) 58   Temp 97.7 F (36.5 C)   Resp 16   Ht 5\' 11"  (1.803 m)   Wt 74.4 kg   SpO2 98%   BMI 22.88 kg/m  I have spoken with Dr. Gilford Rile, we await the MRI brain and will see the patient at that time.  The lesion is unusual thus the MRI will be quite useful.

## 2019-12-28 NOTE — Progress Notes (Signed)
  Speech Language Pathology Treatment: Dysphagia  Patient Details Name: Gary Hudson MRN: [id number] DOB: 1966/04/03 Today's Date: 12/28/2019 Time: 8144-8185 SLP Time Calculation (min) (ACUTE ONLY): 8 min  Assessment / Plan / Recommendation Clinical Impression  Pt repositioning himself from edge of bed to lying down complaining of headache when his therapist's arrived. Just completed lunch and SLP noted a wet vocal quality in addition to hoarseness. Pt recalled one of two swallow precautions recommended following yesterday's MBS and demonstrated chin tuck but not volitional throat clear. Therapist also provided demonstration for more fluid and effective technique. Vocal quality continued to have wet quality an appeared to minimally clear after directive for throat clearing. Pt will need continued ST to reiterate importance and reinforce clinical reasoning for strategies. Continue regular/thin, chin tuck with chin and throat clear.    HPI HPI: Gary Hudson is a 54 year old M with significant PMH of loculated left empyema with cavitary lung lesion in the left lower lobe s/p VATS in 01/2019, significant tobacco use, type II diabetes, hypertension, and hypothyroidism. He presents to the ED today for intractable headaches. Of note, he had a recent hospitalization 1 month ago after presenting for hemoptysis and weight loss and was found to have enlargement of his prior cavitary lung lesions with numerous bilateral smaller lesions and right cerebellar cystic mass.      SLP Plan  Continue with current plan of care       Recommendations  Diet recommendations: Thin liquid;Regular Liquids provided via: Cup;Straw Medication Administration: Whole meds with puree Supervision: Patient able to self feed;Intermittent supervision to cue for compensatory strategies Compensations: Small sips/bites;Chin tuck;Clear throat after each swallow Postural Changes and/or Swallow Maneuvers: Seated upright 90 degrees                 Oral Care Recommendations: Oral care BID Follow up Recommendations: Outpatient SLP;Home health SLP SLP Visit Diagnosis: Dysphagia, pharyngeal phase (R13.13) Plan: Continue with current plan of care                       Gary Hudson 12/28/2019, 12:59 PM  Gary Hudson Gary Hudson.Ed Risk analyst [phone] Office [phone]

## 2019-12-28 NOTE — Progress Notes (Signed)
NAME:  Gary Hudson, MRN:  [id number], DOB:  1965-10-31, LOS: 4 ADMISSION DATE:  12/24/2019, CONSULTATION DATE:  5/3 REFERRING MD:  Dr. Neena Rhymes, CHIEF COMPLAINT:  Cavitary lesions.   Brief History   54 year old male with recent DX cavitary pulmonary lesions, now presenting with headache and found to have cerebellar mass.   History of present illness   54 year old male with PMH as below, which is significant for DM and empyema s/p VATs in 2020. He has at least a 60 pack year smoking history. More recently he was admitted to [facility] 4/6 with complaints of hemoptysis, headache, and unintentional weight loss and was found to have bilateral pulmonary cavitations There was also concern for vocal cord paralysis on CT neck, which rased concern for TB. TB was ruled out. Pulmonary was consulted for lesions. He underwent EBUS, from which pathology was negative for malignancy. He underwent additional bronchoscopy of LLL endobronchial lesion, FNA was also negative on pathology.  CT of the head demonstrated R cerebellar mass with vasogenic edema. He was started on decadron and his headaches improved nicely. Neurosurgery was consulted and were suspicious of CNS TB infection. ID was following and felt as though TB was ruled out. ACE, ANA, RF, legionella antigen, and ANCA testing all negative. He was scheduled for CT guided lung biopsy planned for 4/13, but he left AMA before this could be done.  He was set up for outpatient follow up with neurosurgery and pulmonary.   He presented to [facility] ED 5/2 with complaints of ongoing headache and shortness of breath. CT of the chest was repeated and cavitary disease was found to be progressive. CT of the head unchanged. Neurosurgery contacted in ED and recommended outpatient follow up.   Past Medical History   has a past medical history of Diabetes mellitus without complication (Gary Hudson).   Significant Hospital Events   4/6 admit to [facility] for hemoptysis/headache.  Found toh have innumerable pulmonary cavitations and a R cerebellar mass. Underwent EBUS/ bronch 5/3 admit to [facility] for same.    Consults:  Pulm  Procedures:  5/4 Bronchoscopy with BAL and brushings  Significant Diagnostic Tests:  CT head 5/2 > Hypodense mass of the right cerebellar hemisphere with mass effect on the fourth ventricle and distal cerebral aqueduct. No hydrocephalus or hemorrhage CTA chest 5/2 >No PE, Known cavitary process throughout the lungs, with dominant left lower lobe cavitary lesion, similar to CT last month. Additional cavitary lesions throughout both lungs, also seen on prior, majority of which are stable. There is however a new/progressive left upper lobe cavitary lesion that measures 3.5 x 2.9 cm with surrounding consolidation. Slight progression in mediastinal and hilar adenopathy.  Micro Data:  COVID-19 5/2 neg  Antimicrobials:    Interim history/subjective:   Complains of headache Double vision No cough or fevers  Objective   Blood pressure 121/69, pulse (!) 58, temperature 97.7 F (36.5 C), resp. rate 16, height _0  (1.803 m), weight 74.4 kg, SpO2 98 %.        Intake/Output Summary (Last 24 hours) at 12/28/2019 1210 Last data filed at 12/28/2019 0900 Gross per 24 hour  Intake 646 ml  Output -  Net 646 ml   Filed Weights   12/24/19 1510 12/26/19 0321  Weight: 74.8 kg 74.4 kg    Examination: General:  middle aged male in NAD HENT: [place]/AT, PERRL, no JVD. Poor dentition.  Lungs: Clear bilateral breath sounds Cardiovascular: RRR, no MRG Abdomen: Soft,  non-tender, non-distended Extremities: No acute deformity or ROM limitation.  Neuro: Alert, interactive, grossly nonfocal, nystagmus on right gaze  Resolved Hospital Problem list     Assessment & Plan:  Gary Hudson is a 54 y.o. man who presents with:  History of LLL empyema s/p VATS decortication 2020 Pulmonary cavitary lesions: bilateral innumerable lesions. Most notable in the LLL.  Underwent EBUS and bronchoscopy at [facility] last month, both of which were negative. TB ruled out by ID during that admission and there was not felt to be an infectious etiology. ANA, ACE, RF, and ANCA all negative. Etiology unclear, but most concerning culprit is malignancy vs atypical infection.  5/4 repeat bronchoscopy brushings and BAL negative for AFB or malignancy   Cerebellar Mass: having significant pain, may or may not be related to his cavitary lesions, but defer to primary team. MRI 4/9 reviewed which shows cystic lesion with 10 enhancing wall?  Atypical met   Plan: Since bronchoscopy has been nondiagnostic x2, will ask IR to proceed with CT-guided biopsy of left lower lobe peripheral cavity to rule out malignancy and also sent for AFB and fungal -discussed with ID service  -Proceed with neurosurgical evaluation meantime, if above procedures not helpful then will likely need resection of cerebellar mass . Updated patient in detail  PCCM will follow   Kara Mead MD. FCCP. [facility]  If no response to pager , please call [phone]   12/28/2019

## 2019-12-28 NOTE — Progress Notes (Signed)
[facility] for Infectious Disease    Date of Admission:  12/24/2019              ID: Gary Hudson is a 54 y.o. male with pulmonary cavitary lesions and brain lesions concerning for malignancy vs. Indolent infection Active Problems:   Uncontrolled type 2 diabetes mellitus with hyperglycemia (HCC)   Essential hypertension   Status post thoracotomy   Poor dentition   Cavitary lesion of lung   History of pleural effusion   Dyslipidemia   Cerebellar mass   Hypothyroid   Normocytic anemia   Unintentional weight loss   Hemoptysis   Dysphagia    Subjective: Complains of right sided headache of moderate severity, new onset double vision in the last 2 days. He notices having gait abnormality; bronch yesterday cx pending  Reviewed bronch culture and path from [facility] from April - mTB ruled out on path and QTF.  Medications:  . atorvastatin  20 mg Oral Daily  . dexamethasone  4 mg Oral Q6H  . feeding supplement (ENSURE ENLIVE)  237 mL Oral TID BM  . losartan  50 mg Oral Daily   And  . hydrochlorothiazide  12.5 mg Oral Daily  . insulin aspart  0-20 Units Subcutaneous Q4H  . insulin glargine  12 Units Subcutaneous Daily  . levothyroxine  25 mcg Oral Q0600  . multivitamin with minerals  1 tablet Oral Daily  . nicotine  14 mg Transdermal Daily  . senna-docusate  2 tablet Oral BID  . sodium chloride flush  3 mL Intravenous Q12H    Objective: Vital signs in last 24 hours: Temp:  [97.7 F (36.5 C)-97.8 F (36.6 C)] 97.7 F (36.5 C) (05/06 0859) Pulse Rate:  [58-64] 64 (05/06 1609) Resp:  [16-17] 16 (05/06 1609) BP: (115-122)/(64-73) 115/64 (05/06 1609) SpO2:  [96 %-98 %] 97 % (05/06 1609) Physical Exam  Constitutional: He is oriented to person, place, and time. He appears well-developed and well-nourished. No distress.  HENT:  Mouth/Throat: Oropharynx is clear and moist. No oropharyngeal exudate.  Cardiovascular: Normal rate, regular rhythm and normal heart sounds.  Exam reveals no gallop and no friction rub.  No murmur heard.  Pulmonary/Chest: Effort normal and breath sounds normal. +rhonchi Abdominal: Soft. Bowel sounds are normal. He exhibits no distension. There is no tenderness.  Lymphadenopathy:  He has no cervical adenopathy.  Neurological: He is alert and oriented to person, place, and time. +right gaze - nystagmus Skin: Skin is warm and dry. No rash noted. No erythema.  Psychiatric: He has a normal mood and affect. His behavior is normal.     Lab Results Recent Labs    12/28/19 0328  WBC 15.3*  HGB 11.8*  HCT 37.3*  NA 132*  K 4.3  CL 91*  CO2 28  BUN 19  CREATININE 0.70    Microbiology: pending Studies/Results: MR BRAIN W WO CONTRAST  Result Date: 12/28/2019 CLINICAL DATA:  Cerebellar mass. EXAM: MRI HEAD WITHOUT AND WITH CONTRAST TECHNIQUE: Multiplanar, multiecho pulse sequences of the brain and surrounding structures were obtained without and with intravenous contrast. CONTRAST:  7.29mL GADAVIST GADOBUTROL 1 MMOL/ML IV SOLN COMPARISON:  None. FINDINGS: Brain: Large cystic appearing mass lesion is seen centered on the right cerebellar hemisphere with low signal on T1 and increased signal T2 with layering debris and then rim enhancement. Restricted diffusion is noted along the lesion margins with facilitated diffusion within the lesion. It measures approximately 4.5 x 3.9 x 3.2 cm (T,  AP, cc) and causes mass effect on the fourth ventricle, pons and medulla. No herniation through the foramen magnum. No hydrocephalus or hemorrhage. No other lesion identified. Vascular: Normal flow voids. Skull and upper cervical spine: Normal marrow signal. Sinuses/Orbits: Mucous retention cyst in the left frontal sinus and mucosal thickening of the left ethmoid cells. Orbits are maintained. Other: None. IMPRESSION: Large cystic appearing mass centered on the right cerebellar hemisphere measuring approximately 4.5 x 3.9 x 3.2 cm with layering debris and  thin rim enhancement. Given absence of intralesional restricted diffusion, findings favor metastasis over abscess. No hydrocephalus. Electronically Signed   By: Pedro Earls M.D.   On: 12/28/2019 15:32   DG Swallowing Func-Speech Pathology  Result Date: 12/27/2019 Objective Swallowing Evaluation: Type of Study: MBS-Modified Barium Swallow Study  Patient Details Name: Gary Hudson MRN: [id number] Date of Birth: 10/13/1965 Today's Date: 12/27/2019 Time: SLP Start Time (ACUTE ONLY): 0910 -SLP Stop Time (ACUTE ONLY): 0930 SLP Time Calculation (min) (ACUTE ONLY): 20 min Past Medical History: Past Medical History: Diagnosis Date . Diabetes mellitus without complication [facility])  Past Surgical History: Past Surgical History: Procedure Laterality Date . BRONCHIAL BRUSHINGS  12/26/2019  Procedure: BRONCHIAL BRUSHINGS;  Surgeon: Spero Geralds, MD;  Location: [place];  Service: Pulmonary;; . BRONCHIAL WASHINGS  12/26/2019  Procedure: BRONCHIAL WASHINGS;  Surgeon: Spero Geralds, MD;  Location: [facility] ENDOSCOPY;  Service: Pulmonary;; . TEE WITHOUT CARDIOVERSION N/A 01/26/2019  Procedure: TRANSESOPHAGEAL ECHOCARDIOGRAM (TEE);  Surgeon: Prescott Gum, Collier Salina, MD;  Location[facility] OR;  Service: Thoracic;  Laterality: N/A; . VIDEO ASSISTED THORACOSCOPY (VATS)/EMPYEMA Left 01/26/2019  Procedure: VIDEO ASSISTED THORACOSCOPY (VATS)/DRAINAGE OF EMPYEMA  AND DECORTICATION;  Surgeon: Ivin Poot, MD;  Location: [place];  Service: Thoracic;  Laterality: Left; Marland Kitchen VIDEO BRONCHOSCOPY N/A 01/26/2019  Procedure: VIDEO BRONCHOSCOPY;  Surgeon: Prescott Gum, Collier Salina, MD;  Location: [place];  Service: Thoracic;  Laterality: N/A; . VIDEO BRONCHOSCOPY N/A 12/26/2019  Procedure: VIDEO BRONCHOSCOPY WITHOUT FLUORO;  Surgeon: Spero Geralds, MD;  Location[facility] END[facility]COPY;  Service: Pulmonary;  Laterality: N/A; HPI: Gary Hudson is a 54 year old M with significant PMH of loculated left empyema with cavitary lung lesion in the left lower lobe s/p VATS in 01/2019,  significant tobacco use, type II diabetes, hypertension, and hypothyroidism. He presents to the ED today for intractable headaches. Of note, he had a recent hospitalization 1 month ago after presenting for hemoptysis and weight loss and was found to have enlargement of his prior cavitary lung lesions with numerous bilateral smaller lesions and right cerebellar cystic mass. 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 Pt denies ever having seen ENT.  Subjective: Pt upright in chair; cooperative for test. Assessment / Plan / Recommendation CHL IP CLINICAL IMPRESSIONS 12/27/2019 Clinical Impression Gary Hudson was seen under videofluoroscopy with thin liquids via spoon/cup/straw, nectar thick liquids via cup, honey thick liquids via cup, purees, and regular solids. Pt demonstrates a moderate pharyngeal dysphagia. Deficits include reduced hyoid excursion, laryngeal elevation, and subsequent epiglottic inversion and UES opening. These deficits resulted in moderate amounts of residue t/o pharynx, specifically with thickened liquids, purees and solids in the valleculae and pyriform sinus. Residue was noted to penetrate and remain in trace amounts d/t UES squeeze pushing residue into laryngeal vestibule. Penetration also occurred before the swallow with thin, nectar, and honey thick liquids. Aspiration in trace amounts was noted with both nectar  thick and thin and pt demonstrated no efforts to clear (reduced sensation). Given use of strategies (chin tuck, throat clear) pt was able to effectively maintain clear airway with thin liquids. Pt's cognition is adequate for use of strategies, however, he does have a long history of non-compliance. Pt would likely benefit from further education on need and use of strategies, with family present if able. Recommendations: thin liquids with chin tuck (throat clear after the swallow),  regular solids, small bites/sips, alternate liquids/solids to clear residue, follow up with ENT regarding vocal fold paralysis SLP Visit Diagnosis Dysphagia, pharyngeal phase (R13.13) Impact on safety and function Moderate aspiration risk   CHL IP TREATMENT RECOMMENDATION 12/27/2019 Treatment Recommendations Therapy as outlined in treatment plan below   Prognosis 12/27/2019 Prognosis for Safe Diet Advancement Guarded Barriers to Reach Goals Other (Comment) Barriers/Prognosis Comment Guarded based on h/o non-compliance and medical prognosis given cerebellar mass CHL IP DIET RECOMMENDATION 12/27/2019 SLP Diet Recommendations Regular solids;Thin liquid Liquid Administration via Straw;Cup Medication Administration Whole meds with puree Compensations Small sips/bites;Chin tuck;Clear throat after each swallow Postural Changes Chin tuck in upright position   CHL IP OTHER RECOMMENDATIONS 12/27/2019 Recommended Consults Consider ENT evaluation Oral Care Recommendations Oral care BID Other Recommendations    CHL IP FOLLOW UP RECOMMENDATIONS 12/27/2019 Follow up Recommendations Outpatient SLP;Home health SLP   CHL IP FREQUENCY AND DURATION 12/27/2019 Speech Therapy Frequency (ACUTE ONLY) min 1 x/week Treatment Duration 2 weeks      CHL IP ORAL PHASE 12/27/2019 Oral Phase WFL Oral - Honey Cup WFL Oral - Nectar Cup WFL Oral - Thin Teaspoon WFL Oral - Thin Cup WFL Oral - Thin Straw WFL Oral - Puree WFL Oral - Regular WFL Oral Phase - Comment Poor dentition, not impacting mastication  CHL IP PHARYNGEAL PHASE 12/27/2019 Pharyngeal Phase Impaired Pharyngeal- Honey Cup Penetration/Apiration after swallow;Pharyngeal residue - cp segment;Pharyngeal residue - pyriform;Pharyngeal residue - valleculae Pharyngeal- Nectar Cup Trace aspiration;Reduced epiglottic inversion;Reduced laryngeal elevation;Reduced airway/laryngeal closure Pharyngeal- Thin Teaspoon Reduced anterior laryngeal mobility;Reduced epiglottic inversion;Reduced airway/laryngeal  closure;Reduced laryngeal elevation;Pharyngeal residue - valleculae;Pharyngeal residue - pyriform;Pharyngeal residue - cp segment Pharyngeal- Thin Cup Trace aspiration;Penetration/Apiration after swallow;Penetration/Aspiration before swallow;Reduced epiglottic inversion;Reduced anterior laryngeal mobility;Reduced laryngeal elevation;Reduced airway/laryngeal closure Pharyngeal- Thin Straw Reduced epiglottic inversion;Reduced anterior laryngeal mobility;Penetration/Apiration after swallow;Penetration/Aspiration before swallow;Reduced airway/laryngeal closure;Reduced laryngeal elevation Pharyngeal- Puree Pharyngeal residue - valleculae;Pharyngeal residue - pyriform;Pharyngeal residue - cp segment Pharyngeal- Regular Pharyngeal residue - pyriform;Pharyngeal residue - valleculae;Pharyngeal residue - posterior pharnyx;Reduced epiglottic inversion;Reduced pharyngeal peristalsis;Reduced laryngeal elevation;Reduced anterior laryngeal mobility  CHL IP CERVICAL ESOPHAGEAL PHASE 12/27/2019 Cervical Esophageal Phase Impaired Honey Cup Reduced cricopharyngeal relaxation Nectar Cup Reduced cricopharyngeal relaxation Thin Cup Reduced cricopharyngeal relaxation Thin Straw Reduced cricopharyngeal relaxation Puree Reduced cricopharyngeal relaxation Regular Reduced cricopharyngeal relaxation Madison P. Isenhour, M.S., CCC-SLP Speech-Language Pathologist Acute Rehabilitation Services Pager: [place] 12/27/2019, 9:47 AM                Assessment/Plan: Etiology of his presentation is still not conclusive for infection- need further tissue culture as discussed with dr Elsworth Soho, who will reach out to IR to biopsy lateral affected area (previous empyema in 2020 in the left lower lobe). Has numerous cystic lesions bilaterally in lungs presently. 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 Please send tissue for aerobic, AFB, and fungal culture  Brain lesions=  agree with need for mri and possibly biopsy if lung lesions cultures are not helpful. Concern that he  has progression of symptoms. Defer to neurosurgery evaluation of mri studies.  Hold off on abtx for now.  [facility] Cell: [phone] Pager: [phone]  12/28/2019, 5:55 PM

## 2019-12-29 ENCOUNTER — Encounter (HOSPITAL_COMMUNITY): Payer: Self-pay | Admitting: Internal Medicine

## 2019-12-29 DIAGNOSIS — E46 Unspecified protein-calorie malnutrition: Secondary | ICD-10-CM

## 2019-12-29 DIAGNOSIS — R918 Other nonspecific abnormal finding of lung field: Secondary | ICD-10-CM

## 2019-12-29 DIAGNOSIS — I10 Essential (primary) hypertension: Secondary | ICD-10-CM

## 2019-12-29 DIAGNOSIS — E039 Hypothyroidism, unspecified: Secondary | ICD-10-CM

## 2019-12-29 DIAGNOSIS — G939 Disorder of brain, unspecified: Secondary | ICD-10-CM

## 2019-12-29 DIAGNOSIS — D72829 Elevated white blood cell count, unspecified: Secondary | ICD-10-CM

## 2019-12-29 DIAGNOSIS — D649 Anemia, unspecified: Secondary | ICD-10-CM

## 2019-12-29 DIAGNOSIS — E119 Type 2 diabetes mellitus without complications: Secondary | ICD-10-CM

## 2019-12-29 LAB — BASIC METABOLIC PANEL
Anion gap: 9 (ref 5–15)
BUN: 19 mg/dL (ref 6–20)
CO2: 32 mmol/L (ref 22–32)
Calcium: 9 mg/dL (ref 8.9–10.3)
Chloride: 92 mmol/L — ABNORMAL LOW (ref 98–111)
Creatinine, Ser: 0.67 mg/dL (ref 0.61–1.24)
GFR calc Af Amer: >60 mL/min (ref >60)
GFR calc non Af Amer: >60 mL/min (ref >60)
Glucose, Bld: 158 mg/dL — ABNORMAL HIGH (ref 70–99)
Potassium: 4.1 mmol/L (ref 3.5–5.1)
Sodium: 133 mmol/L — ABNORMAL LOW (ref 135–145)

## 2019-12-29 LAB — CBC
HCT: 39.1 % (ref 39.0–52.0)
Hemoglobin: 12.5 g/dL — ABNORMAL LOW (ref 13.0–17.0)
MCH: 26.6 pg (ref 26.0–34.0)
MCHC: 32 g/dL (ref 30.0–36.0)
MCV: 83.2 fL (ref 80.0–100.0)
Platelets: 842 10*3/uL — ABNORMAL HIGH (ref 150–400)
RBC: 4.7 MIL/uL (ref 4.22–5.81)
RDW: 13.5 % (ref 11.5–15.5)
WBC: 19 10*3/uL — ABNORMAL HIGH (ref 4.0–10.5)
nRBC: 0 % (ref 0.0–0.2)

## 2019-12-29 LAB — GLUCOSE, CAPILLARY
Glucose-Capillary: 116 mg/dL — ABNORMAL HIGH (ref 70–99)
Glucose-Capillary: 144 mg/dL — ABNORMAL HIGH (ref 70–99)
Glucose-Capillary: 154 mg/dL — ABNORMAL HIGH (ref 70–99)
Glucose-Capillary: 156 mg/dL — ABNORMAL HIGH (ref 70–99)
Glucose-Capillary: 160 mg/dL — ABNORMAL HIGH (ref 70–99)
Glucose-Capillary: 201 mg/dL — ABNORMAL HIGH (ref 70–99)

## 2019-12-29 LAB — FUNGITELL, SERUM: Fungitell Result: <31 pg/mL (ref <80)

## 2019-12-29 MED ORDER — LORAZEPAM 2 MG/ML IJ SOLN
1.0000 mg | Freq: Once | INTRAMUSCULAR | Status: AC
  Administered 2019-12-30: 1 mg via INTRAVENOUS
  Filled 2019-12-29: qty 1

## 2019-12-29 MED ORDER — HYDROMORPHONE HCL 1 MG/ML IJ SOLN
1.5000 mg | INTRAMUSCULAR | Status: DC | PRN
  Administered 2019-12-29 – 2019-12-31 (×10): 1.5 mg via INTRAVENOUS
  Filled 2019-12-29 (×10): qty 2

## 2019-12-29 MED ORDER — INSULIN GLARGINE 100 UNIT/ML ~~LOC~~ SOLN
15.0000 [IU] | Freq: Every day | SUBCUTANEOUS | Status: DC
  Administered 2019-12-29 – 2019-12-31 (×3): 15 [IU] via SUBCUTANEOUS
  Filled 2019-12-29 (×3): qty 0.15

## 2019-12-29 MED ORDER — HYDROMORPHONE HCL 1 MG/ML IJ SOLN: 1.0000 mg | INTRAMUSCULAR | Status: DC | PRN

## 2019-12-29 NOTE — Progress Notes (Signed)
PCCM Interval Progress Note  IR note reviewed - plans for CT guided core biopsy.  Believe this will be performed today 5/7.  PCCM will follow peripherally until biopsy results are available.   Rest per primary team.   Montey Hora, [facility] 12/29/2019, 9:02 AM

## 2019-12-29 NOTE — Progress Notes (Signed)
Patient ID: MELCHIZEDEK ESPINOLA, male   DOB: Dec 27, 1965, 54 y.o.   MRN: [id number]         [facility] for Infectious Disease  Date of Admission:  12/24/2019     ASSESSMENT: Mr. Swamy continues to do poorly.  Repeat brain MRI shows a (probably enlarging) cystic mass in the right cerebellum.  All results from his recent bronchoscopy remain negative.  The cause of his year-long decline remains uncertain.  PLAN: 1. Await evaluation by neurosurgery and results of today's lung biopsy 2. Observe off of antibiotics 3. Please call me for any infectious disease questions this weekend  Active Problems:   Cavitary lesion of lung   Cerebellar mass   Unintentional weight loss   Hemoptysis   Dysphagia   Uncontrolled type 2 diabetes mellitus with hyperglycemia (HCC)   Essential hypertension   Status post thoracotomy   Poor dentition   History of pleural effusion   Dyslipidemia   Hypothyroid   Normocytic anemia   Scheduled Meds: . atorvastatin  20 mg Oral Daily  . dexamethasone  4 mg Oral Q6H  . feeding supplement (ENSURE ENLIVE)  237 mL Oral TID BM  . losartan  50 mg Oral Daily   And  . hydrochlorothiazide  12.5 mg Oral Daily  . insulin aspart  0-20 Units Subcutaneous Q4H  . insulin glargine  15 Units Subcutaneous Daily  . levothyroxine  25 mcg Oral Q0600  . multivitamin with minerals  1 tablet Oral Daily  . nicotine  14 mg Transdermal Daily  . senna-docusate  2 tablet Oral BID  . sodium chloride flush  3 mL Intravenous Q12H   Continuous Infusions: PRN Meds:.acetaminophen **OR** acetaminophen, HYDROmorphone (DILAUDID) injection, ondansetron **OR** ondansetron (ZOFRAN) IV   SUBJECTIVE: "I have a headache".  Review of Systems: Review of Systems  Unable to perform ROS: Mental acuity    No Known Allergies  OBJECTIVE: Vitals:   12/28/19 0859 12/28/19 1609 12/28/19 2141 12/29/19 0805  BP: 121/69 115/64 104/62 124/71  Pulse: (!) 58 64 66 63  Resp: 16 16 15 19   Temp: 97.7  F (36.5 C)  98 F (36.7 C) 97.9 F (36.6 C)  TempSrc:   Oral   SpO2: 98% 97% 92% 94%  Weight:      Height:       Body mass index is 22.88 kg/m.  Physical Exam Constitutional:      Comments: He is resting quietly in bed.  He is flushed and appears uncomfortable due to headache.  His sister is visiting.     Lab Results Lab Results  Component Value Date   WBC 19.0 (H) 12/29/2019   HGB 12.5 (L) 12/29/2019   HCT 39.1 12/29/2019   MCV 83.2 12/29/2019   PLT 842 (H) 12/29/2019    Lab Results  Component Value Date   CREATININE 0.67 12/29/2019   BUN 19 12/29/2019   NA 133 (L) 12/29/2019   K 4.1 12/29/2019   CL 92 (L) 12/29/2019   CO2 32 12/29/2019    Lab Results  Component Value Date   ALT 19 12/25/2019   AST 15 12/25/2019   ALKPHOS 429 (H) 12/25/2019   BILITOT 0.4 12/25/2019     Microbiology: Recent Results (from the past 240 hour(s))  Respiratory Panel by RT PCR (Flu A&B, Covid) - Nasopharyngeal Swab     Status: None   Collection Time: 12/24/19  4:57 PM   Specimen: Nasopharyngeal Swab  Result Value Ref Range Status   SARS Coronavirus  2 by RT PCR NEGATIVE NEGATIVE Final    Comment: (NOTE) SARS-CoV-2 target nucleic acids are NOT DETECTED. The SARS-CoV-2 RNA is generally detectable in upper respiratoy specimens during the acute phase of infection. The lowest concentration of SARS-CoV-2 viral copies this assay can detect is 131 copies/mL. A negative result does not preclude SARS-Cov-2 infection and should not be used as the sole basis for treatment or other patient management decisions. A negative result may occur with  improper specimen collection/handling, submission of specimen other than nasopharyngeal swab, presence of viral mutation(s) within the areas targeted by this assay, and inadequate number of viral copies (<131 copies/mL). A negative result must be combined with clinical observations, patient history, and epidemiological information. The expected  result is Negative. Fact Sheet for Patients:  PinkCheek.be Fact Sheet for Healthcare Providers:  GravelBags.it This test is not yet ap proved or cleared by the [place] FDA and  has been authorized for detection and/or diagnosis of SARS-CoV-2 by FDA under an Emergency Use Authorization (EUA). This EUA will remain  in effect (meaning this test can be used) for the duration of the COVID-19 declaration under Section 564(b)(1) of the Act, 21 U.S.C. section 360bbb-3(b)(1), unless the authorization is terminated or revoked sooner.    Influenza A by PCR NEGATIVE NEGATIVE Final   Influenza B by PCR NEGATIVE NEGATIVE Final    Comment: (NOTE) The Xpert Xpress SARS-CoV-2/FLU/RSV assay is intended as an aid in  the diagnosis of influenza from Nasopharyngeal swab specimens and  should not be used as a sole basis for treatment. Nasal washings and  aspirates are unacceptable for Xpert Xpress SARS-CoV-2/FLU/RSV  testing. Fact Sheet for Patients: PinkCheek.be Fact Sheet for Healthcare Providers: GravelBags.it This test is not yet approved or cleared by the [place] FDA and  has been authorized for detection and/or diagnosis of SARS-CoV-2 by  FDA under an Emergency Use Authorization (EUA). This EUA will remain  in effect (meaning this test can be used) for the duration of the  Covid-19 declaration under Section 564(b)(1) of the Act, 21  U.S.C. section 360bbb-3(b)(1), unless the authorization is  terminated or revoked. Performed at [facility], [place] [address] [place], [place] [address]   Anaerobic culture     Status: None (Preliminary result)   Collection Time: 12/26/19 10:53 AM   Specimen: Bronchial Alveolar Lavage; Lung  Result Value Ref Range Status   Specimen Description BRONCHIAL ALVEOLAR LAVAGE  Final   Special Requests NONE  Final   Gram Stain   Final    NO  WBC SEEN NO ORGANISMS SEEN Performed at [facility], [place] [address], [place], [place] [address]    Culture   Final    NO ANAEROBES ISOLATED; CULTURE IN PROGRESS FOR 5 DAYS   Report Status PENDING  Incomplete  Acid Fast Smear (AFB)     Status: None   Collection Time: 12/26/19 10:53 AM   Specimen: Bronchial Alveolar Lavage; Lung  Result Value Ref Range Status   AFB Specimen Processing Concentration  Final   Acid Fast Smear Negative  Final    Comment: (NOTE) Performed At: [facility] [address] [place], [place] [id number] Rush Farmer MD TI:[id number]    Source (AFB) BRONCHIAL ALVEOLAR LAVAGE  Final    [name], MD [facility] [phone] pager   [phone] cell 12/29/2019, 10:39 AM

## 2019-12-29 NOTE — Consult Note (Addendum)
[place]  [phone] Fax:([phone]   MEDICAL ONCOLOGY - INITIAL CONSULTATION  Referral MD: Dr. 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  The patient presented to the emergency room with a headache.  The patient had a recent hospitalization 1 month ago after presenting for hemoptysis and weight loss and was found to have enlargement of his prior cavitary lung lesions and numerous bilateral smaller lesions as well as a right cerebellar cystic mass.  He had an extensive work-up during that admission that was unremarkable.  The patient left AMA before additional biopsies could be performed and it was recommended for him to have outpatient follow-up.  He was seen about 2 weeks ago by his primary care provider and referred to outpatient neurology.  He was still awaiting an appointment from neurology.  He was also seen by pulmonology on outpatient basis on 12/22/2019 who ordered a CT-guided FNA.  On admission, the patient continued to have headaches and dizziness at home.  He also reported lethargy.  In the ER, a repeat CT of the head showed hypodense right cerebellar mass with mass-effect and without hydrocephalus or hemorrhage.  A CT angiogram the chest did not show any evidence of PE but did show the known cavitary process similar to prior with new/progressive left upper lobe cavitary lesion and surrounding consolidation.  Labs on admission showed a WBC of 12.7, platelet count 645,000 sodium 132, alk phos 471.  He had an MRI of the brain with and without contrast performed on 12/28/2019 which showed a large cystic appearing mass centered on the right cerebellar hemisphere measuring approximate  4.5 x 3.9 x 3.2 cm with layering debris and thin rim of enhancement and given the absence of intralesional restricted diffusion, findings favor metastasis over abscess.  The patient had a bronchoscopy performed on 12/26/2019.  Pathology report did not identify any malignant cells.  Of note, the patient had an outside bronchoscopy on 11/30/2019 at [facility] and the biopsy showed reactive bronchial tissue with atypical squamous epithelium, favor reactive with marked acute and chronic inflammation.  The patient reports that his headaches are not really better today.  He is still having dizziness.  PT was in the room at the time my visit and the patient has indicated that he does not wish to walk with them today.  He reports fatigue and generalized weakness.  He reports a fair appetite but has lost by his estimation 100 pounds in the past 8 months.  He reports some dysphagia with solid foods.  He denies chest pain or shortness of breath.  He reports a cough intermittently.  Denies abdominal pain, nausea, vomiting.  He has not noticed any bleeding.  The patient states that he lives in [place] and lives with his Selena Lesser.  The patient has a girlfriend but is not married.  States that he has 8 children.  Denies history of alcohol use.  He currently smokes cigarettes and in the past has smoked up to 4 packs cigarettes per day.  More recently, he smokes 1/2 pack of cigarettes per day.  He started smoking in his teens. Medical oncology was asked see the patient to make recommendations regarding his lung mass and cerebellar mass.   Past  Medical History:  Diagnosis Date  . Diabetes mellitus without complication ([place])   :  Past Surgical History:  Procedure Laterality Date  . BRONCHIAL BRUSHINGS  12/26/2019   Procedure: BRONCHIAL BRUSHINGS;  Surgeon: Spero Geralds, MD;  Location: [place];  Service: Pulmonary;;  . BRONCHIAL WASHINGS  12/26/2019   Procedure: BRONCHIAL WASHINGS;  Surgeon: Spero Geralds, MD;   Location: [facility] ENDOSCOPY;  Service: Pulmonary;;  . TEE WITHOUT CARDIOVERSION N/A 01/26/2019   Procedure: TRANSESOPHAGEAL ECHOCARDIOGRAM (TEE);  Surgeon: Prescott Gum, Collier Salina, MD;  Location: [facility] OR;  Service: Thoracic;  Laterality: N/A;  . VIDEO ASSISTED THORACOSCOPY (VATS)/EMPYEMA Left 01/26/2019   Procedure: VIDEO ASSISTED THORACOSCOPY (VATS)/DRAINAGE OF EMPYEMA  AND DECORTICATION;  Surgeon: Ivin Poot, MD;  Location: [place];  Service: Thoracic;  Laterality: Left;  Marland Kitchen VIDEO BRONCHOSCOPY N/A 01/26/2019   Procedure: VIDEO BRONCHOSCOPY;  Surgeon: Prescott Gum, Collier Salina, MD;  Location: [place];  Service: Thoracic;  Laterality: N/A;  . VIDEO BRONCHOSCOPY N/A 12/26/2019   Procedure: VIDEO BRONCHOSCOPY WITHOUT FLUORO;  Surgeon: Spero Geralds, MD;  Location: [facility] ENDOSCOPY;  Service: Pulmonary;  Laterality: N/A;  :  Current Facility-Administered Medications  Medication Dose Route Frequency Provider Last Rate Last Admin  . acetaminophen (TYLENOL) tablet 650 mg  650 mg Oral Q6H PRN Masoudi, Elhamalsadat, MD       Or  . acetaminophen (TYLENOL) suppository 650 mg  650 mg Rectal Q6H PRN Masoudi, Elhamalsadat, MD      . atorvastatin (LIPITOR) tablet 20 mg  20 mg Oral Daily Dewayne Hatch, MD   Stopped at 12/29/19 6389  . dexamethasone (DECADRON) tablet 4 mg  4 mg Oral Q6H Maudie Mercury, MD   Stopped at 12/29/19 1244  . feeding supplement (ENSURE ENLIVE) (ENSURE ENLIVE) liquid 237 mL  237 mL Oral TID BM Velna Ochs, MD   237 mL at 12/27/19 2114  . losartan (COZAAR) tablet 50 mg  50 mg Oral Daily Dewayne Hatch, MD   Stopped at 12/29/19 3734   And  . hydrochlorothiazide (MICROZIDE) capsule 12.5 mg  12.5 mg Oral Daily Dewayne Hatch, MD   Stopped at 12/29/19 2876  . HYDROmorphone (DILAUDID) injection 1.5 mg  1.5 mg Intravenous Q4H PRN Maudie Mercury, MD      . insulin aspart (novoLOG) injection 0-20 Units  0-20 Units Subcutaneous Q4H Maudie Mercury, MD   4 Units at 12/29/19 0402  . insulin  glargine (LANTUS) injection 15 Units  15 Units Subcutaneous Daily Maudie Mercury, MD   15 Units at 12/29/19 0920  . levothyroxine (SYNTHROID) tablet 25 mcg  25 mcg Oral Q0600 Dewayne Hatch, MD   25 mcg at 12/28/19 0520  . multivitamin with minerals tablet 1 tablet  1 tablet Oral Daily Velna Ochs, MD   Stopped at 12/29/19 [phone]  . nicotine (NICODERM CQ - dosed in mg/24 hours) patch 14 mg  14 mg Transdermal Daily Masoudi, Elhamalsadat, MD      . ondansetron (ZOFRAN) tablet 4 mg  4 mg Oral Q6H PRN Masoudi, Elhamalsadat, MD       Or  . ondansetron (ZOFRAN) injection 4 mg  4 mg Intravenous Q6H PRN Masoudi, Elhamalsadat, MD   4 mg at 12/25/19 1318  . senna-docusate (Senokot-S) tablet 2 tablet  2 tablet Oral BID Modena Nunnery D, DO   Stopped at 12/29/19 7262  . sodium chloride flush (NS) 0.9 % injection 3 mL  3 mL Intravenous Q12H Masoudi, Elhamalsadat, MD   3 mL at 12/29/19 [phone]  No Known Allergies:  Family History  Problem Relation Age of Onset  . CAD Father   . Diabetes Mellitus II Neg Hx   :  Social History   Socioeconomic History  . Marital status: Single    Spouse name: Not on file  . Number of children: Not on file  . Years of education: Not on file  . Highest education level: Not on file  Occupational History  . Not on file  Tobacco Use  . Smoking status: Former Research scientist (life sciences)  . Smokeless tobacco: Never Used  Substance and Sexual Activity  . Alcohol use: Never  . Drug use: Never  . Sexual activity: Not on file  Other Topics Concern  . Not on file  Social History Narrative  . Not on file   Social Determinants of Health   Financial Resource Strain:   . Difficulty of Paying Living Expenses:   Food Insecurity:   . Worried About Charity fundraiser in the Last Year:   . Arboriculturist in the Last Year:   Transportation Needs:   . Film/video editor (Medical):   Marland Kitchen Lack of Transportation (Non-Medical):   Physical Activity:   . Days of Exercise per  Week:   . Minutes of Exercise per Session:   Stress:   . Feeling of Stress :   Social Connections:   . Frequency of Communication with Friends and Family:   . Frequency of Social Gatherings with Friends and Family:   . Attends Religious Services:   . Active Member of Clubs or Organizations:   . Attends Archivist Meetings:   Marland Kitchen Marital Status:   Intimate Partner Violence:   . Fear of Current or Ex-Partner:   . Emotionally Abused:   Marland Kitchen Physically Abused:   . Sexually Abused:   :  Review of Systems: A comprehensive 14 point review of systems was negative except as noted in the HPI.  Exam: Patient Vitals for the past 24 hrs:  BP Temp Temp src Pulse Resp SpO2  12/29/19 0805 124/71 97.9 F (36.6 C) -- 63 19 94 %  12/28/19 2141 104/62 98 F (36.7 C) Oral 66 15 92 %  12/28/19 1609 115/64 -- -- 64 16 97 %    General: Frail male, no distress.  His voice is noted to be hoarse. Eyes:  no scleral icterus.   ENT: Poor dentition, there were no oropharyngeal lesions.     Lymphatics:  Negative cervical, supraclavicular or axillary adenopathy.   Respiratory: lungs were clear bilaterally without wheezing or crackles.   Cardiovascular:  Regular rate and rhythm, S1/S2, without murmur, rub or gallop.  There was no pedal edema.   GI:  abdomen was soft, flat, nontender, nondistended, without organomegaly.   Skin exam was without echymosis, petichae.   Neuro exam was nonfocal. Patient was alert and oriented.  Attention was good.   Language was appropriate.  Mood was normal without depression.  Speech was not pressured.  Thought content was not tangential.     Lab Results  Component Value Date   WBC 19.0 (H) 12/29/2019   HGB 12.5 (L) 12/29/2019   HCT 39.1 12/29/2019   PLT 842 (H) 12/29/2019   GLUCOSE 158 (H) 12/29/2019   ALT 19 12/25/2019   AST 15 12/25/2019   NA 133 (L) 12/29/2019   K 4.1 12/29/2019   CL 92 (L) 12/29/2019   CREATININE 0.67 12/29/2019   BUN 19 12/29/2019   CO2  32 12/29/2019  DG Chest 2 View  Result Date: 12/24/2019 CLINICAL DATA:  Shortness of breath EXAM: CHEST - 2 VIEW COMPARISON:  Chest radiographs and CT, 11/27/2019 FINDINGS: The heart size and mediastinal contours are within normal limits. Redemonstrated heterogeneous opacity and multiple cavitary lesions of the left lower lobe. The visualized skeletal structures are unremarkable. IMPRESSION: Redemonstrated heterogeneous opacity and multiple cavitary lesions of the left lower lobe, better assessed by prior CT. No new airspace opacity. Electronically Signed   By: Eddie Candle M.D.   On: 12/24/2019 16:03   CT Head Wo Contrast  Result Date: 12/24/2019 CLINICAL DATA:  Headache. Cerebellar mass EXAM: CT HEAD WITHOUT CONTRAST TECHNIQUE: Contiguous axial images were obtained from the base of the skull through the vertex without intravenous contrast. COMPARISON:  None. FINDINGS: Brain: Hypodense mass of the right cerebellar hemisphere measures 4.2 x 3.3 cm. There is mass effect on the fourth ventricle and distal cerebral aqueduct. No hydrocephalus. No hemorrhage or extra-axial collection. Vascular: No hyperdense vessel or unexpected calcification. Skull: Normal Sinuses/Orbits: Clear sinuses and mastoids. Normal orbits. Other: None. IMPRESSION: Hypodense mass of the right cerebellar hemisphere with mass effect on the fourth ventricle and distal cerebral aqueduct. No hydrocephalus or hemorrhage. Electronically Signed   By: Ulyses Jarred M.D.   On: 12/24/2019 20:03   CT Angio Chest PE W and/or Wo Contrast  Result Date: 12/24/2019 CLINICAL DATA:  Shortness of breath. EXAM: CT ANGIOGRAPHY CHEST WITH CONTRAST TECHNIQUE: Multidetector CT imaging of the chest was performed using the standard protocol during bolus administration of intravenous contrast. Multiplanar CT image reconstructions and MIPs were obtained to evaluate the vascular anatomy. CONTRAST:  184m OMNIPAQUE IOHEXOL 350 MG/ML SOLN COMPARISON:  Radiograph  earlier this day. Chest [place] 1 [place] [facility]: There are no filling defects within the pulmonary arteries to suggest pulmonary embolus. Aortic atherosclerosis. No aneurysm. Heart is normal in size. No pericardial effusion. There are coronary artery calcifications. Mediastinum/Nodes: Shotty mediastinal and hilar adenopathy, with slight progression from last month. For example right hilar node measures 14 mm, previously 12 mm. No esophageal wall thickening. No thyroid nodule. Lungs/Pleura: Multifocal cavitary lesions throughout both lungs, again seen. Dominant left lower lobe cavitary process in the infrahilar region measures 6 x 3.6 cm, previously 7.6 x 4.8 cm. This abuts the left lower lobe bronchus which is appears occluded. Adjacent peripheral cavitary nodules and consolidation in the left lower lobe. There is a new dominant cavitary process in the left upper lobe measuring 2.9 x 3.5 cm, new from last month. Surrounding consolidation and ground-glass. Small cavitary nodules are present throughout all lobes of both lungs, otherwise grossly stable from prior. Small left pleural effusion. There is central bronchial thickening diffusely. Upper Abdomen: Ingested material distends the stomach. No acute findings. Musculoskeletal: Sclerosis centered at T7-T8 endplate consistent with Modic endplate changes, also seen on thoracic MRI performed 12/01/2019 at an outside institution. No discrete focal bone lesion. Review of the MIP images confirms the above findings. IMPRESSION: 1. No pulmonary embolus. 2. Known cavitary process throughout the lungs, with dominant left lower lobe cavitary lesion, similar to CT last month. Patient recently underwent bronchoscopy 11/30/2019 at an outside institution. Recommend correlation with prior bronchoscopy results. Imaging findings may be infectious or neoplastic. 3. Additional cavitary lesions throughout both lungs, also seen on prior,  majority of which are stable. There is however a new/progressive left upper lobe cavitary lesion that measures 3.5 x 2.9 cm with surrounding consolidation. 4. Slight progression in mediastinal  and hilar adenopathy. Electronically Signed   By: Keith Rake M.D.   On: 12/24/2019 19:46   MR BRAIN W WO CONTRAST  Result Date: 12/28/2019 CLINICAL DATA:  Cerebellar mass. EXAM: MRI HEAD WITHOUT AND WITH CONTRAST TECHNIQUE: Multiplanar, multiecho pulse sequences of the brain and surrounding structures were obtained without and with intravenous contrast. CONTRAST:  7.65m GADAVIST GADOBUTROL 1 MMOL/ML IV SOLN COMPARISON:  None. FINDINGS: Brain: Large cystic appearing mass lesion is seen centered on the right cerebellar hemisphere with low signal on T1 and increased signal T2 with layering debris and then rim enhancement. Restricted diffusion is noted along the lesion margins with facilitated diffusion within the lesion. It measures approximately 4.5 x 3.9 x 3.2 cm (T, AP, cc) and causes mass effect on the fourth ventricle, pons and medulla. No herniation through the foramen magnum. No hydrocephalus or hemorrhage. No other lesion identified. Vascular: Normal flow voids. Skull and upper cervical spine: Normal marrow signal. Sinuses/Orbits: Mucous retention cyst in the left frontal sinus and mucosal thickening of the left ethmoid cells. Orbits are maintained. Other: None. IMPRESSION: Large cystic appearing mass centered on the right cerebellar hemisphere measuring approximately 4.5 x 3.9 x 3.2 cm with layering debris and thin rim enhancement. Given absence of intralesional restricted diffusion, findings favor metastasis over abscess. No hydrocephalus. Electronically Signed   By: KPedro EarlsM.D.   On: 12/28/2019 15:32   DG Swallowing Func-Speech Pathology  Result Date: 12/27/2019 Objective Swallowing Evaluation: Type of Study: MBS-Modified Barium Swallow Study  Patient Details Name: RGOPAL MALTER MRN: [id number] of Birth: 810-01-1967Today's Date: 12/27/2019 Time: SLP Start Time (ACUTE ONLY): 0910 -SLP Stop Time (ACUTE ONLY): 0930 SLP Time Calculation (min) (ACUTE ONLY): 20 min Past Medical History: Past Medical History: Diagnosis Date . Diabetes mellitus without complication ([facility]  Past Surgical History: Past Surgical History: Procedure Laterality Date . BRONCHIAL BRUSHINGS  12/26/2019  Procedure: BRONCHIAL BRUSHINGS;  Surgeon: DSpero Geralds MD;  [place]  Service: Pulmonary;; . BRONCHIAL WASHINGS  12/26/2019  Procedure: BRONCHIAL WASHINGS;  Surgeon: DSpero Geralds MD;  [facility];  Service: Pulmonary;; . TEE WITHOUT CARDIOVERSION N/A 01/26/2019  Procedure: TRANSESOPHAGEAL ECHOCARDIOGRAM (TEE);  Surgeon: VPrescott Gum PCollier Salina MD;  [facility];  Service: Thoracic;  Laterality: N/A; . VIDEO ASSISTED THORACOSCOPY (VATS)/EMPYEMA Left 01/26/2019  Procedure: VIDEO ASSISTED THORACOSCOPY (VATS)/DRAINAGE OF EMPYEMA  AND DECORTICATION;  Surgeon: VIvin Poot MD;  [place]  Service: Thoracic;  Laterality: Left; .Marland KitchenVIDEO BRONCHOSCOPY N/A 01/26/2019  Procedure: VIDEO BRONCHOSCOPY;  Surgeon: VPrescott Gum PCollier Salina MD;  [place]  Service: Thoracic;  Laterality: N/A; . VIDEO BRONCHOSCOPY N/A 12/26/2019  Procedure: VIDEO BRONCHOSCOPY WITHOUT FLUORO;  Surgeon: DSpero Geralds MD;  [facility];  Service: Pulmonary;  Laterality: N/A; HPI: Mr. BFriesenis a 54year old M with significant PMH of loculated left empyema with cavitary lung lesion in the left lower lobe s/p VATS in 01/2019, significant tobacco use, type II diabetes, hypertension, and hypothyroidism. He presents to the ED today for intractable headaches. Of note, he had a recent hospitalization 1 month ago after presenting for hemoptysis and weight loss and was found to have enlargement of his prior cavitary lung lesions with numerous bilateral smaller lesions and right cerebellar cystic mass. 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 Pt denies ever having seen ENT.  Subjective: Pt upright  in chair; cooperative for test. Assessment / Plan / Recommendation CHL IP CLINICAL IMPRESSIONS 12/27/2019 Clinical Impression Gary Hudson was seen under videofluoroscopy with thin liquids via spoon/cup/straw, nectar thick liquids via cup, honey thick liquids via cup, purees, and regular solids. Pt demonstrates a moderate pharyngeal dysphagia. Deficits include reduced hyoid excursion, laryngeal elevation, and subsequent epiglottic inversion and UES opening. These deficits resulted in moderate amounts of residue t/o pharynx, specifically with thickened liquids, purees and solids in the valleculae and pyriform sinus. Residue was noted to penetrate and remain in trace amounts d/t UES squeeze pushing residue into laryngeal vestibule. Penetration also occurred before the swallow with thin, nectar, and honey thick liquids. Aspiration in trace amounts was noted with both nectar thick and thin and pt demonstrated no efforts to clear (reduced sensation). Given use of strategies (chin tuck, throat clear) pt was able to effectively maintain clear airway with thin liquids. Pt's cognition is adequate for use of strategies, however, he does have a long history of non-compliance. Pt would likely benefit from further education on need and use of strategies, with family present if able. Recommendations: thin liquids with chin tuck (throat clear after the swallow), regular solids, small bites/sips, alternate liquids/solids to clear residue, follow up with ENT regarding vocal fold paralysis SLP Visit Diagnosis Dysphagia, pharyngeal phase (R13.13) Impact on safety and function Moderate aspiration risk   CHL IP TREATMENT RECOMMENDATION 12/27/2019 Treatment Recommendations Therapy as outlined in treatment plan below   Prognosis 12/27/2019 Prognosis for Safe Diet  Advancement Guarded Barriers to Reach Goals Other (Comment) Barriers/Prognosis Comment Guarded based on h/o non-compliance and medical prognosis given cerebellar mass CHL IP DIET RECOMMENDATION 12/27/2019 SLP Diet Recommendations Regular solids;Thin liquid Liquid Administration via Straw;Cup Medication Administration Whole meds with puree Compensations Small sips/bites;Chin tuck;Clear throat after each swallow Postural Changes Chin tuck in upright position   CHL IP OTHER RECOMMENDATIONS 12/27/2019 Recommended Consults Consider ENT evaluation Oral Care Recommendations Oral care BID Other Recommendations    CHL IP FOLLOW UP RECOMMENDATIONS 12/27/2019 Follow up Recommendations Outpatient SLP;Home health SLP   CHL IP FREQUENCY AND DURATION 12/27/2019 Speech Therapy Frequency (ACUTE ONLY) min 1 x/week Treatment Duration 2 weeks      CHL IP ORAL PHASE 12/27/2019 Oral Phase WFL Oral - Honey Cup WFL Oral - Nectar Cup WFL Oral - Thin Teaspoon WFL Oral - Thin Cup WFL Oral - Thin Straw WFL Oral - Puree WFL Oral - Regular WFL Oral Phase - Comment Poor dentition, not impacting mastication  CHL IP PHARYNGEAL PHASE 12/27/2019 Pharyngeal Phase Impaired Pharyngeal- Honey Cup Penetration/Apiration after swallow;Pharyngeal residue - cp segment;Pharyngeal residue - pyriform;Pharyngeal residue - valleculae Pharyngeal- Nectar Cup Trace aspiration;Reduced epiglottic inversion;Reduced laryngeal elevation;Reduced airway/laryngeal closure Pharyngeal- Thin Teaspoon Reduced anterior laryngeal mobility;Reduced epiglottic inversion;Reduced airway/laryngeal closure;Reduced laryngeal elevation;Pharyngeal residue - valleculae;Pharyngeal residue - pyriform;Pharyngeal residue - cp segment Pharyngeal- Thin Cup Trace aspiration;Penetration/Apiration after swallow;Penetration/Aspiration before swallow;Reduced epiglottic inversion;Reduced anterior laryngeal mobility;Reduced laryngeal elevation;Reduced airway/laryngeal closure Pharyngeal- Thin Straw Reduced  epiglottic inversion;Reduced anterior laryngeal mobility;Penetration/Apiration after swallow;Penetration/Aspiration before swallow;Reduced airway/laryngeal closure;Reduced laryngeal elevation Pharyngeal- Puree Pharyngeal residue - valleculae;Pharyngeal residue - pyriform;Pharyngeal residue - cp segment Pharyngeal- Regular Pharyngeal residue - pyriform;Pharyngeal residue - valleculae;Pharyngeal residue - posterior pharnyx;Reduced epiglottic inversion;Reduced pharyngeal peristalsis;Reduced laryngeal elevation;Reduced anterior laryngeal mobility  CHL IP CERVICAL ESOPHAGEAL PHASE 12/27/2019 Cervical Esophageal Phase Impaired Honey Cup Reduced cricopharyngeal relaxation Nectar Cup Reduced cricopharyngeal relaxation Thin Cup Reduced cricopharyngeal relaxation Thin Straw Reduced cricopharyngeal relaxation Puree Reduced cricopharyngeal relaxation Regular Reduced cricopharyngeal relaxation Madison P. Isenhour, M.S.,  CCC-SLP Speech-Language Pathologist Acute Rehabilitation Services Pager: [place] 12/27/2019, 9:47 AM              ECHOCARDIOGRAM COMPLETE  Result Date: 12/26/2019    ECHOCARDIOGRAM REPORT   Patient Name:   SHAYE LAGACE Date of Exam: 12/26/2019 Medical Rec #:  [id number]       Height:       71.0 in Accession #:    [id number]      Weight:       164.0 lb Date of Birth:  1966/07/18       BSA:          1.938 m Patient Age:    87 years        BP:           125/89 mmHg Patient Gender: M               HR:           72 bpm. Exam Location:  Inpatient Procedure: 2D Echo Indications:    lung abnormality  History:        Patient has prior history of Echocardiogram examinations, most                 recent 01/26/2019. Risk Factors:Diabetes and Former Smoker.  Sonographer:    Jannett Celestine RDCS (AE) Referring Phys: [id number] CAROLYN GUILLOUD IMPRESSIONS  1. Left ventricular ejection fraction, by estimation, is 60 to 65%. The left ventricle has normal function. The left ventricle has no regional wall motion  abnormalities. Left ventricular diastolic parameters were normal.  2. Right ventricular systolic function is normal. The right ventricular size is mildly enlarged.  3. Left atrial size was mildly dilated.  4. Right atrial size was mildly dilated.  5. The mitral valve is normal in structure. Trivial mitral valve regurgitation. No evidence of mitral stenosis.  6. The aortic valve has an indeterminant number of cusps. Aortic valve regurgitation is not visualized. No aortic stenosis is present.  7. The inferior vena cava is normal in size with <50% respiratory variability, suggesting right atrial pressure of 8 mmHg. FINDINGS  Left Ventricle: Left ventricular ejection fraction, by estimation, is 60 to 65%. The left ventricle has normal function. The left ventricle has no regional wall motion abnormalities. The left ventricular internal cavity size was normal in size. There is  no left ventricular hypertrophy. Left ventricular diastolic parameters were normal. Right Ventricle: The right ventricular size is mildly enlarged. No increase in right ventricular wall thickness. Right ventricular systolic function is normal. Left Atrium: Left atrial size was mildly dilated. Right Atrium: Right atrial size was mildly dilated. Pericardium: Trivial pericardial effusion is present. Mitral Valve: The mitral valve is normal in structure. Trivial mitral valve regurgitation. No evidence of mitral valve stenosis. Tricuspid Valve: The tricuspid valve is normal in structure. Tricuspid valve regurgitation is trivial. No evidence of tricuspid stenosis. Aortic Valve: The aortic valve has an indeterminant number of cusps. Aortic valve regurgitation is not visualized. No aortic stenosis is present. There is mild calcification of the aortic valve. Pulmonic Valve: The pulmonic valve was not well visualized. Pulmonic valve regurgitation is not visualized. No evidence of pulmonic stenosis. Aorta: The aortic arch was not well visualized, the  ascending aorta was not well visualized and the aortic root was not well visualized. Pulmonary Artery: The pulmonary artery is not well seen. Venous: The inferior vena cava is normal in size with less than 50% respiratory variability, suggesting right atrial  pressure of 8 mmHg. IAS/Shunts: No atrial level shunt detected by color flow Doppler.  LEFT VENTRICLE PLAX 2D LVIDd:         4.50 cm  Diastology LVIDs:         2.90 cm  LV e' lateral:   10.30 cm/s LV PW:         1.00 cm  LV E/e' lateral: 9.6 LV IVS:        1.10 cm  LV e' medial:    7.07 cm/s LVOT diam:     2.10 cm  LV E/e' medial:  14.0 LV SV:         63 LV SV Index:   33 LVOT Area:     3.46 cm  RIGHT VENTRICLE RV S prime:     12.20 cm/s TAPSE (M-mode): 2.0 cm LEFT ATRIUM             Index       RIGHT ATRIUM           Index LA diam:        3.90 cm 2.01 cm/m  RA Area:     20.90 cm LA Vol (A2C):   61.0 ml 31.47 ml/m RA Volume:   57.70 ml  29.77 ml/m LA Vol (A4C):   71.8 ml 37.05 ml/m LA Biplane Vol: 69.7 ml 35.96 ml/m  AORTIC VALVE LVOT Vmax:   80.20 cm/s LVOT Vmean:  51.900 cm/s LVOT VTI:    0.182 m  AORTA Ao Root diam: 3.30 cm MITRAL VALVE MV Area (PHT): 3.72 cm    SHUNTS MV Decel Time: 204 msec    Systemic VTI:  0.18 m MV E velocity: 99.20 cm/s  Systemic Diam: 2.10 cm MV A velocity: 60.00 cm/s MV E/A ratio:  1.65 Buford Dresser MD Electronically signed by Buford Dresser MD Signature Date/Time: 12/26/2019/5:17:17 PM    Final      DG Chest 2 View  Result Date: 12/24/2019 CLINICAL DATA:  Shortness of breath EXAM: CHEST - 2 VIEW COMPARISON:  Chest radiographs and CT, 11/27/2019 FINDINGS: The heart size and mediastinal contours are within normal limits. Redemonstrated heterogeneous opacity and multiple cavitary lesions of the left lower lobe. The visualized skeletal structures are unremarkable. IMPRESSION: Redemonstrated heterogeneous opacity and multiple cavitary lesions of the left lower lobe, better assessed by prior CT. No new airspace  opacity. Electronically Signed   By: Eddie Candle M.D.   On: 12/24/2019 16:03   CT Head Wo Contrast  Result Date: 12/24/2019 CLINICAL DATA:  Headache. Cerebellar mass EXAM: CT HEAD WITHOUT CONTRAST TECHNIQUE: Contiguous axial images were obtained from the base of the skull through the vertex without intravenous contrast. COMPARISON:  None. FINDINGS: Brain: Hypodense mass of the right cerebellar hemisphere measures 4.2 x 3.3 cm. There is mass effect on the fourth ventricle and distal cerebral aqueduct. No hydrocephalus. No hemorrhage or extra-axial collection. Vascular: No hyperdense vessel or unexpected calcification. Skull: Normal Sinuses/Orbits: Clear sinuses and mastoids. Normal orbits. Other: None. IMPRESSION: Hypodense mass of the right cerebellar hemisphere with mass effect on the fourth ventricle and distal cerebral aqueduct. No hydrocephalus or hemorrhage. Electronically Signed   By: Ulyses Jarred M.D.   On: 12/24/2019 20:03   CT Angio Chest PE W and/or Wo Contrast  Result Date: 12/24/2019 CLINICAL DATA:  Shortness of breath. EXAM: CT ANGIOGRAPHY CHEST WITH CONTRAST TECHNIQUE: Multidetector CT imaging of the chest was performed using the standard protocol during bolus administration of intravenous contrast. Multiplanar CT image reconstructions and MIPs  were obtained to evaluate the vascular anatomy. CONTRAST:  152m OMNIPAQUE IOHEXOL 350 MG/ML SOLN COMPARISON:  Radiograph earlier this day. Chest [place] [facility] [facility]: There are no filling defects within the pulmonary arteries to suggest pulmonary embolus. Aortic atherosclerosis. No aneurysm. Heart is normal in size. No pericardial effusion. There are coronary artery calcifications. Mediastinum/Nodes: Shotty mediastinal and hilar adenopathy, with slight progression from last month. For example right hilar node measures 14 mm, previously 12 mm. No esophageal wall thickening. No thyroid nodule.  Lungs/Pleura: Multifocal cavitary lesions throughout both lungs, again seen. Dominant left lower lobe cavitary process in the infrahilar region measures 6 x 3.6 cm, previously 7.6 x 4.8 cm. This abuts the left lower lobe bronchus which is appears occluded. Adjacent peripheral cavitary nodules and consolidation in the left lower lobe. There is a new dominant cavitary process in the left upper lobe measuring 2.9 x 3.5 cm, new from last month. Surrounding consolidation and ground-glass. Small cavitary nodules are present throughout all lobes of both lungs, otherwise grossly stable from prior. Small left pleural effusion. There is central bronchial thickening diffusely. Upper Abdomen: Ingested material distends the stomach. No acute findings. Musculoskeletal: Sclerosis centered at T7-T8 endplate consistent with Modic endplate changes, also seen on thoracic MRI performed 12/01/2019 at an outside institution. No discrete focal bone lesion. Review of the MIP images confirms the above findings. IMPRESSION: 1. No pulmonary embolus. 2. Known cavitary process throughout the lungs, with dominant left lower lobe cavitary lesion, similar to CT last month. Patient recently underwent bronchoscopy 11/30/2019 at an outside institution. Recommend correlation with prior bronchoscopy results. Imaging findings may be infectious or neoplastic. 3. Additional cavitary lesions throughout both lungs, also seen on prior, majority of which are stable. There is however a new/progressive left upper lobe cavitary lesion that measures 3.5 x 2.9 cm with surrounding consolidation. 4. Slight progression in mediastinal and hilar adenopathy. Electronically Signed   By: MKeith RakeM.D.   On: 12/24/2019 19:46   MR BRAIN W WO CONTRAST  Result Date: 12/28/2019 CLINICAL DATA:  Cerebellar mass. EXAM: MRI HEAD WITHOUT AND WITH CONTRAST TECHNIQUE: Multiplanar, multiecho pulse sequences of the brain and surrounding structures were obtained without and  with intravenous contrast. CONTRAST:  7.520mGADAVIST GADOBUTROL 1 MMOL/ML IV SOLN COMPARISON:  None. FINDINGS: Brain: Large cystic appearing mass lesion is seen centered on the right cerebellar hemisphere with low signal on T1 and increased signal T2 with layering debris and then rim enhancement. Restricted diffusion is noted along the lesion margins with facilitated diffusion within the lesion. It measures approximately 4.5 x 3.9 x 3.2 cm (T, AP, cc) and causes mass effect on the fourth ventricle, pons and medulla. No herniation through the foramen magnum. No hydrocephalus or hemorrhage. No other lesion identified. Vascular: Normal flow voids. Skull and upper cervical spine: Normal marrow signal. Sinuses/Orbits: Mucous retention cyst in the left frontal sinus and mucosal thickening of the left ethmoid cells. Orbits are maintained. Other: None. IMPRESSION: Large cystic appearing mass centered on the right cerebellar hemisphere measuring approximately 4.5 x 3.9 x 3.2 cm with layering debris and thin rim enhancement. Given absence of intralesional restricted diffusion, findings favor metastasis over abscess. No hydrocephalus. Electronically Signed   By: KaPedro Earls.D.   On: 12/28/2019 15:32   DG Swallowing Func-Speech Pathology  Result Date: 12/27/2019 Objective Swallowing Evaluation: Type of Study: MBS-Modified Barium Swallow Study  Patient Details Name: Gary Hudson: [id number] [id number]  Birth: 1966/05/26 Today's Date: 12/27/2019 Time: SLP Start Time (ACUTE ONLY): 0910 -SLP Stop Time (ACUTE ONLY): 0930 SLP Time Calculation (min) (ACUTE ONLY): 20 min Past Medical History: Past Medical History: Diagnosis Date . Diabetes mellitus without complication [facility])  Past Surgical History: Past Surgical History: Procedure Laterality Date . BRONCHIAL BRUSHINGS  12/26/2019  Procedure: BRONCHIAL BRUSHINGS;  Surgeon: Spero Geralds, MD;  Location: [place];  Service: Pulmonary;; . BRONCHIAL WASHINGS   12/26/2019  Procedure: BRONCHIAL WASHINGS;  Surgeon: Spero Geralds, MD;  Location: [facility] ENDOSCOPY;  Service: Pulmonary;; . TEE WITHOUT CARDIOVERSION N/A 01/26/2019  Procedure: TRANSESOPHAGEAL ECHOCARDIOGRAM (TEE);  Surgeon: Prescott Gum, Collier Salina, MD;  Location: [facility] OR;  Service: Thoracic;  Laterality: N/A; . VIDEO ASSISTED THORACOSCOPY (VATS)/EMPYEMA Left 01/26/2019  Procedure: VIDEO ASSISTED THORACOSCOPY (VATS)/DRAINAGE OF EMPYEMA  AND DECORTICATION;  Surgeon: Ivin Poot, MD;  Location: [place];  Service: Thoracic;  Laterality: Left; Marland Kitchen VIDEO BRONCHOSCOPY N/A 01/26/2019  Procedure: VIDEO BRONCHOSCOPY;  Surgeon: Prescott Gum, Collier Salina, MD;  Location: [place];  Service: Thoracic;  Laterality: N/A; . VIDEO BRONCHOSCOPY N/A 12/26/2019  Procedure: VIDEO BRONCHOSCOPY WITHOUT FLUORO;  Surgeon: Spero Geralds, MD;  Location: [facility] ENDOSCOPY;  Service: Pulmonary;  Laterality: N/A; HPI: Gary Hudson is a 54 year old M with significant PMH of loculated left empyema with cavitary lung lesion in the left lower lobe s/p VATS in 01/2019, significant tobacco use, type II diabetes, hypertension, and hypothyroidism. He presents to the ED today for intractable headaches. Of note, he had a recent hospitalization 1 month ago after presenting for hemoptysis and weight loss and was found to have enlargement of his prior cavitary lung lesions with numerous bilateral smaller lesions and right cerebellar cystic mass. 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 Pt denies ever having seen ENT.  Subjective: Pt upright in chair; cooperative for test. Assessment / Plan / Recommendation CHL IP CLINICAL IMPRESSIONS 12/27/2019 Clinical Impression Gary Hudson was seen under videofluoroscopy with thin liquids via spoon/cup/straw, nectar thick liquids via cup, honey thick liquids via cup, purees, and regular solids. Pt demonstrates a moderate pharyngeal dysphagia. Deficits  include reduced hyoid excursion, laryngeal elevation, and subsequent epiglottic inversion and UES opening. These deficits resulted in moderate amounts of residue t/o pharynx, specifically with thickened liquids, purees and solids in the valleculae and pyriform sinus. Residue was noted to penetrate and remain in trace amounts d/t UES squeeze pushing residue into laryngeal vestibule. Penetration also occurred before the swallow with thin, nectar, and honey thick liquids. Aspiration in trace amounts was noted with both nectar thick and thin and pt demonstrated no efforts to clear (reduced sensation). Given use of strategies (chin tuck, throat clear) pt was able to effectively maintain clear airway with thin liquids. Pt's cognition is adequate for use of strategies, however, he does have a long history of non-compliance. Pt would likely benefit from further education on need and use of strategies, with family present if able. Recommendations: thin liquids with chin tuck (throat clear after the swallow), regular solids, small bites/sips, alternate liquids/solids to clear residue, follow up with ENT regarding vocal fold paralysis SLP Visit Diagnosis Dysphagia, pharyngeal phase (R13.13) Impact on safety and function Moderate aspiration risk   CHL IP TREATMENT RECOMMENDATION 12/27/2019 Treatment Recommendations Therapy as outlined in treatment plan below   Prognosis 12/27/2019 Prognosis for Safe Diet Advancement Guarded Barriers to Reach Goals Other (Comment) Barriers/Prognosis Comment Guarded based on h/o  non-compliance and medical prognosis given cerebellar mass CHL IP DIET RECOMMENDATION 12/27/2019 SLP Diet Recommendations Regular solids;Thin liquid Liquid Administration via Straw;Cup Medication Administration Whole meds with puree Compensations Small sips/bites;Chin tuck;Clear throat after each swallow Postural Changes Chin tuck in upright position   CHL IP OTHER RECOMMENDATIONS 12/27/2019 Recommended Consults Consider ENT  evaluation Oral Care Recommendations Oral care BID Other Recommendations    CHL IP FOLLOW UP RECOMMENDATIONS 12/27/2019 Follow up Recommendations Outpatient SLP;Home health SLP   CHL IP FREQUENCY AND DURATION 12/27/2019 Speech Therapy Frequency (ACUTE ONLY) min 1 x/week Treatment Duration 2 weeks      CHL IP ORAL PHASE 12/27/2019 Oral Phase WFL Oral - Honey Cup WFL Oral - Nectar Cup WFL Oral - Thin Teaspoon WFL Oral - Thin Cup WFL Oral - Thin Straw WFL Oral - Puree WFL Oral - Regular WFL Oral Phase - Comment Poor dentition, not impacting mastication  CHL IP PHARYNGEAL PHASE 12/27/2019 Pharyngeal Phase Impaired Pharyngeal- Honey Cup Penetration/Apiration after swallow;Pharyngeal residue - cp segment;Pharyngeal residue - pyriform;Pharyngeal residue - valleculae Pharyngeal- Nectar Cup Trace aspiration;Reduced epiglottic inversion;Reduced laryngeal elevation;Reduced airway/laryngeal closure Pharyngeal- Thin Teaspoon Reduced anterior laryngeal mobility;Reduced epiglottic inversion;Reduced airway/laryngeal closure;Reduced laryngeal elevation;Pharyngeal residue - valleculae;Pharyngeal residue - pyriform;Pharyngeal residue - cp segment Pharyngeal- Thin Cup Trace aspiration;Penetration/Apiration after swallow;Penetration/Aspiration before swallow;Reduced epiglottic inversion;Reduced anterior laryngeal mobility;Reduced laryngeal elevation;Reduced airway/laryngeal closure Pharyngeal- Thin Straw Reduced epiglottic inversion;Reduced anterior laryngeal mobility;Penetration/Apiration after swallow;Penetration/Aspiration before swallow;Reduced airway/laryngeal closure;Reduced laryngeal elevation Pharyngeal- Puree Pharyngeal residue - valleculae;Pharyngeal residue - pyriform;Pharyngeal residue - cp segment Pharyngeal- Regular Pharyngeal residue - pyriform;Pharyngeal residue - valleculae;Pharyngeal residue - posterior pharnyx;Reduced epiglottic inversion;Reduced pharyngeal peristalsis;Reduced laryngeal elevation;Reduced anterior laryngeal  mobility  CHL IP CERVICAL ESOPHAGEAL PHASE 12/27/2019 Cervical Esophageal Phase Impaired Honey Cup Reduced cricopharyngeal relaxation Nectar Cup Reduced cricopharyngeal relaxation Thin Cup Reduced cricopharyngeal relaxation Thin Straw Reduced cricopharyngeal relaxation Puree Reduced cricopharyngeal relaxation Regular Reduced cricopharyngeal relaxation Madison P. Isenhour, M.S., CCC-SLP Speech-Language Pathologist Acute Rehabilitation Services Pager: [place] 12/27/2019, 9:47 AM              ECHOCARDIOGRAM COMPLETE  Result Date: 12/26/2019    ECHOCARDIOGRAM REPORT   Patient Name:   Gary Hudson Date of Exam: 12/26/2019 Medical Rec #:  [id number]       Height:       71.0 in Accession #:    [id number]      Weight:       164.0 lb Date of Birth:  07/14/1966       BSA:          1.938 m Patient Age:    10 years        BP:           125/89 mmHg Patient Gender: M               HR:           72 bpm. Exam Location:  Inpatient Procedure: 2D Echo Indications:    lung abnormality  History:        Patient has prior history of Echocardiogram examinations, most                 recent 01/26/2019. Risk Factors:Diabetes and Former Smoker.  Sonographer:    Jannett Celestine RDCS (AE) Referring Phys: [id number] CAROLYN GUILLOUD IMPRESSIONS  1. Left ventricular ejection fraction, by estimation, is 60 to 65%. The left ventricle has normal function. The left ventricle has no regional wall motion abnormalities. Left ventricular diastolic parameters were normal.  2. Right ventricular systolic function is normal. The right ventricular size is mildly enlarged.  3. Left atrial size was mildly dilated.  4. Right atrial size was mildly dilated.  5. The mitral valve is normal in structure. Trivial mitral valve regurgitation. No evidence of mitral stenosis.  6. The aortic valve has an indeterminant number of cusps. Aortic valve regurgitation is not visualized. No aortic stenosis is present.  7. The inferior vena cava is normal in size  with <50% respiratory variability, suggesting right atrial pressure of 8 mmHg. FINDINGS  Left Ventricle: Left ventricular ejection fraction, by estimation, is 60 to 65%. The left ventricle has normal function. The left ventricle has no regional wall motion abnormalities. The left ventricular internal cavity size was normal in size. There is  no left ventricular hypertrophy. Left ventricular diastolic parameters were normal. Right Ventricle: The right ventricular size is mildly enlarged. No increase in right ventricular wall thickness. Right ventricular systolic function is normal. Left Atrium: Left atrial size was mildly dilated. Right Atrium: Right atrial size was mildly dilated. Pericardium: Trivial pericardial effusion is present. Mitral Valve: The mitral valve is normal in structure. Trivial mitral valve regurgitation. No evidence of mitral valve stenosis. Tricuspid Valve: The tricuspid valve is normal in structure. Tricuspid valve regurgitation is trivial. No evidence of tricuspid stenosis. Aortic Valve: The aortic valve has an indeterminant number of cusps. Aortic valve regurgitation is not visualized. No aortic stenosis is present. There is mild calcification of the aortic valve. Pulmonic Valve: The pulmonic valve was not well visualized. Pulmonic valve regurgitation is not visualized. No evidence of pulmonic stenosis. Aorta: The aortic arch was not well visualized, the ascending aorta was not well visualized and the aortic root was not well visualized. Pulmonary Artery: The pulmonary artery is not well seen. Venous: The inferior vena cava is normal in size with less than 50% respiratory variability, suggesting right atrial pressure of 8 mmHg. IAS/Shunts: No atrial level shunt detected by color flow Doppler.  LEFT VENTRICLE PLAX 2D LVIDd:         4.50 cm  Diastology LVIDs:         2.90 cm  LV e' lateral:   10.30 cm/s LV PW:         1.00 cm  LV E/e' lateral: 9.6 LV IVS:        1.10 cm  LV e' medial:    7.07  cm/s LVOT diam:     2.10 cm  LV E/e' medial:  14.0 LV SV:         63 LV SV Index:   33 LVOT Area:     3.46 cm  RIGHT VENTRICLE RV S prime:     12.20 cm/s TAPSE (M-mode): 2.0 cm LEFT ATRIUM             Index       RIGHT ATRIUM           Index LA diam:        3.90 cm 2.01 cm/m  RA Area:     20.90 cm LA Vol (A2C):   61.0 ml 31.47 ml/m RA Volume:   57.70 ml  29.77 ml/m LA Vol (A4C):   71.8 ml 37.05 ml/m LA Biplane Vol: 69.7 ml 35.96 ml/m  AORTIC VALVE LVOT Vmax:   80.20 cm/s LVOT Vmean:  51.900 cm/s LVOT VTI:    0.182 m  AORTA Ao Root diam: 3.30 cm MITRAL VALVE MV Area (PHT): 3.72 cm    SHUNTS MV  Decel Time: 204 msec    Systemic VTI:  0.18 m MV E velocity: 99.20 cm/s  Systemic Diam: 2.10 cm MV A velocity: 60.00 cm/s MV E/A ratio:  1.65 Buford Dresser MD Electronically signed by Buford Dresser MD Signature Date/Time: 12/26/2019/5:17:17 PM    Final    Assessment and Plan:  1.  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  Diabetes mellitus 4.  Hypertension 5.  Hypothyroidism 6.  Protein calorie malnutrition 7.  Leukocytosis and thrombocytosis likely related to dexamethasone 8.  Mild anemia  -CT and MRI results as follows biopsy results have been discussed with the patient.  The patient has had a bronchoscopy with biopsy of his lung mass x2 (1 at outside hospital) and results have been nondiagnostic. -Agree with proceeding with CT-guided biopsy of his lung mass today by IR.  Will await these results. -Continue dexamethasone for vasogenic edema.  Recommend neurosurgery consult for consideration of resecting this cerebellar lesion. -Suspect anemia is likely due to nutritional deficiency.  Will add on a ferritin, iron studies, vitamin B12 level, and folate level to next lab draw.  Thank you for this referral.   Mikey Bussing, DNP, AGPCNP-BC, AOCNP   ADDENDUM: I saw and examined Gary Hudson.  This is a very interesting situation.  I must say that in  25 years, I have never seen lung cancer present with such a cavitary appearance.  I know that he has a significant history of tobacco use.  I know that he is lost 100 pounds in 6 months from what he tells me.  He has had multiple biopsies to date.  So far there is been nothing that is diagnostic.  To me, I think the simplest way to find out what is going on is to resect out this cerebellar mass.  This is caused him a lot of problems.  Is causing a lot of headaches.  He has a hard time walking.  This is a solitary lesion.  This I think will tell us what is going on.  If this is truly malignancy, I would have to think that squamous cell carcinoma would be the histology.  I have not seen  adenocarcinoma develop a cystic nature.  He has terrible dentition.  One would have to worry about maybe septic emboli causing these lesions.  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  He has had an echocardiogram which did not show any obvious valvular issues.  Again, I am not totally convinced that he has cancer.  Again this would be a most unusual presentation for metastatic lung cancer.  I cannot imagine any other malignancy.  He does have fair skin.  I would not think this would be melanoma.  I have to believe that the thrombocytosis and leukocytosis are both reactive.  Please order an MRI of the abdomen and pelvis to make sure there is nothing going on in the abdominal/pelvic area.  I would not order tumor markers right now.  Clearly, this is a situation where "the issue is tissue" and to me, the most accessible tissue is in the brain and this should be removed and this will tell us what is going on.  Gary Hudson is very nice.  I enjoyed talking to him.  He has 8 kids.  I think he has 17 grandchildren.  He is truly blessed.  Lattie Haw, MD  Proverbs 18:10

## 2019-12-29 NOTE — Progress Notes (Signed)
Inpatient Diabetes Program Recommendations  AACE/ADA: New Consensus Statement on Inpatient Glycemic Control (2015)  Target Ranges:  Prepandial:   less than 140 mg/dL      Peak postprandial:   less than 180 mg/dL (1-2 hours)      Critically ill patients:  140 - 180 mg/dL   Lab Results  Component Value Date   GLUCAP 116 (H) 12/29/2019   HGBA1C 13.5 (H) 12/24/2019    Review of Glycemic Control Results for Gary Hudson, Gary Hudson (MRN [id number]) as of 12/29/2019 11:07  Ref. Range 12/28/2019 03:57 12/28/2019 09:00 12/28/2019 11:05 12/28/2019 16:09 12/28/2019 19:51 12/28/2019 23:58 12/29/2019 03:18 12/29/2019 08:02  Glucose-Capillary Latest Ref Range: 70 - 99 mg/dL 194 (H) 186 (H) 219 (H) 365 (H) 338 (H) 284 (H) 160 (H) 116 (H)   Diabetes history:  DM2  Outpatient Diabetes medications:  Glimepiride 4 units qd + metformin 1000 qam and 500 qpm + Lantus 15 units daily  Current orders for Inpatient glycemic control:  Lantus 15 units daily + Novolog 0-20 Q4H + decadron 4 mg Q6H + Ensure Enlive tid (44 grams of CHO)  Inpatient Diabetes Program Recommendations:     Post prandial elevated yesterday.  Please consider,  Novolog 3 units with meals tid if eats at least 50% and cbg is > 80 mg/dl  Thank you, Reche Dixon, RN, BSN Diabetes Coordinator Inpatient Diabetes Program [phone] (team pager from 8a-5p)

## 2019-12-29 NOTE — Plan of Care (Signed)

## 2019-12-29 NOTE — Progress Notes (Signed)
Subjective:  O/N Events: None  Gary Hudson was seen at bedside. Headache unchanged. Denies nausea, vision changes, vertigo, chest pain, dyspnea, dysphagia, abdominal pain. He did ok with imaging yesterday after getting medication. Discussed plan for lung biopsy and consulting oncology today.   Objective:  Vital signs in last 24 hours: Vitals:   12/28/19 0859 12/28/19 1609 12/28/19 2141 12/29/19 0805  BP: 121/69 115/64 104/62 124/71  Pulse: (!) 58 64 66 63  Resp: _0 Temp: 97.7 F (36.5 C)  98 F (36.7 C) 97.9 F (36.6 C)  TempSrc:   Oral   SpO2: 98% 97% 92% 94%  Weight:      Height:       Physical Exam Vitals and nursing note reviewed.  Constitutional:      General: He is not in acute distress.    Appearance: Normal appearance. He is not ill-appearing or toxic-appearing.  HENT:     Head: Normocephalic and atraumatic.  Eyes:     General:        Right eye: No discharge.        Left eye: No discharge.     Conjunctiva/sclera: Conjunctivae normal.  Cardiovascular:     Rate and Rhythm: Normal rate and regular rhythm.     Pulses: Normal pulses.     Heart sounds: Normal heart sounds. No murmur. No friction rub. No gallop.   Pulmonary:     Effort: Pulmonary effort is normal.     Breath sounds: Normal breath sounds. No wheezing, rhonchi or rales.  Abdominal:     General: Bowel sounds are normal.     Palpations: Abdomen is soft.     Tenderness: There is no abdominal tenderness. There is no guarding.  Musculoskeletal:        General: No swelling.     Right lower leg: No edema.     Left lower leg: No edema.  Neurological:     General: No focal deficit present.     Mental Status: He is alert and oriented to person, place, and time.  Psychiatric:        Mood and Affect: Mood normal.        Behavior: Behavior normal.     CBC Latest Ref Rng & Units 12/29/2019 12/28/2019 12/25/2019  WBC 4.0 - 10.5 K/uL 19.0(H) 15.3(H) 11.1(H)  Hemoglobin 13.0 - 17.0 g/dL 12.5(L)  11.8(L) 11.2(L)  Hematocrit 39.0 - 52.0 % 39.1 37.3(L) 35.7(L)  Platelets 150 - 400 K/uL 842(H) 819(H) 643(H)   CMP Latest Ref Rng & Units 12/29/2019 12/28/2019 12/25/2019  Glucose 70 - 99 mg/dL 158(H) 208(H) 160(H)  BUN 6 - 20 mg/dL _1 Creatinine 0.61 - 1.24 mg/dL 0.67 0.70 0.66  Sodium 135 - 145 mmol/L 133(L) 132(L) 134(L)  Potassium 3.5 - 5.1 mmol/L 4.1 4.3 4.2  Chloride 98 - 111 mmol/L 92(L) 91(L) 96(L)  CO2 22 - 32 mmol/L 32 28 26  Calcium 8.9 - 10.3 mg/dL 9.0 9.2 9.1  Total Protein 6.5 - 8.1 g/dL - - 7.1  Total Bilirubin 0.3 - 1.2 mg/dL - - 0.4  Alkaline Phos 38 - 126 U/L - - 429(H)  AST 15 - 41 U/L - - 15  ALT 0 - 44 U/L - - 19   CBG (last 3)  Recent Labs    12/28/19 2358 12/29/19 0318 12/29/19 0802  GLUCAP 284* 160* 116*   Echo Complete:  1. Left ventricular ejection fraction, by estimation, is 60 to 65%.  The  left ventricle has normal function.The left ventricle has no regional  wall motion abnormalities. Left ventricular diastolic parameters were  normal.  2. Right ventricular systolic function is normal. The right ventricular  size is mildly enlarged.  3. Left atrial size was mildly dilated.  4. Right atrial size was mildly dilated.  5. The mitral valve is normal in structure. Trivial mitral valve  regurgitation. No evidence of mitral stenosis.  6. The aortic valve has an indeterminant number of cusps. Aortic valve  regurgitation is not visualized. No aortic stenosis is present.  7. The inferior vena cava is normal in size with <50% respiratory  variability, suggesting right atrial pressure of 8 mmHg.  MRI Brain w/wo Contrast:  IMPRESSION: Large cystic appearing mass centered on the right cerebellar hemisphere measuring approximately 4.5 x 3.9 x 3.2 cm with layering debris and thin rim enhancement. Given absence of intralesional restricted diffusion, findings favor metastasis over abscess. No hydrocephalus.  Assessment/Plan:  Active Problems:    Uncontrolled type 2 diabetes mellitus with hyperglycemia (HCC)   Essential hypertension   Status post thoracotomy   Poor dentition   Cavitary lesion of lung   History of pleural effusion   Dyslipidemia   Cerebellar mass   Hypothyroid   Normocytic anemia   Unintentional weight loss   Hemoptysis   Dysphagia  Persistent Headaches Right cerebellar Cystic Mass: Likely 2/2 to persistent headaches related to cerebellar mass with mass effect. Patient refused MRI on 12/27/2019. Initial Lab results are coming back negative, will consult with neurosurgery to further assess Gary Hudson cerebellar mass. Patient completed MRI 5/7 without complications. Imaging revealed a mass centered in right cerebellar hemisphere measuring 4.5x3.9x3.2 cm with layering debris and thing rim enhancement. Favors metastasis vs infection. Still has complaints of resistant headaches.  - Neurosurgery consulted and will provide recommendations based off of MRI Brain. We appreciate their recommendations.  - Continue Decadron 31m q6h.  - Increase Dilaudid to 1.5 mg Q4H PRN.   Cavitary Lung Disease Infection vs Malignancy: Pt with known pulmonary cavitary lesions of the left lower lobe and numerous cavitary lesions in the bilateral lung. Current every day smoker. Continues to experience hemoptysis, weight loss, and intermittent dyspnea. Overall picture at that time concerning for malignancy vs disseminated fungal/bacterial infection. Completed bronchoalveolar lavage on 12/26/2019. Results of his labs have been negative, and a CT guided biopsy of the LLL cavity lesion is scheduled for 12/29/2019. Due to multiple pulmonary lesions which have not given definitive answers for malignancy vs infection, but a R cerebellar mass concerning for metastasis, and a PMH significant for smoking history, will consult with oncology today for recommendations.  - CT Guided biopsy of the left lower lobe today. - Oncology consulted, we appreciate their  recommendations.  - Pulmonary consulted, we appreciate their recommendations.  - Infectious Disease consulted, we appreciate their recommendations. - Body Fluid cell count: Colorless, clear, monocyte-macrophage-serous Fluid 93 (50-90%) - Acid Fast Culture: In Process - Acid Fast smear: Negative - Gram Stain: No WBC seen, no organisms seen - Anaerobic culture: NGTD  - Cytology- Non PAP: No malignant cells identified, Benign reactive/reparative changes - Fungitell: Pending - Aspergillus Ag: Active - OT recommended 3 in 1 bedside commode  Type II Diabetes - Uncontrolled A1c 13.5 on admission. Pt on glimepiride 426mdaily and metformin 100063mID at home. CBGs continue to be elevated due to decadron, will increase basal insulin dose.  - SSI Resistant. - Increased Lantus to 15U  Hypertension: - Losartan 45m66m  QD - HCZ 12.50m QD  Hypothyroidism - Levothyroxine 237m QD  Prior to Admission Living Arrangement: Home Anticipated Discharge Location: Home Barriers to Discharge: Continued Medical workup Dispo: Anticipated discharge in approximately 2-3 days   WiMaudie MercuryMD 12/29/2019, 11:17 AM Pager: [phone]

## 2019-12-29 NOTE — Progress Notes (Addendum)
Internal Medicine Teaching Service reached out to [facility] at 3:45 p.m. and spoke with the receptionist about [name] MRI Brain at [phone], per documented note on 5/6, we await further recommendations.  Maudie Mercury, MD IMTS, PGY-1 12/29/2019,5:50 PM

## 2019-12-29 NOTE — Progress Notes (Signed)
PT Cancellation Note  Patient Details Name: Gary Hudson MRN: [id number] DOB: 1965/09/18   Cancelled Treatment:    Reason Eval/Treat Not Completed: Patient declined, no reason specified; patient awaiting biopsy and reports does not want to walk with PT today despite encouragement.  NP in from medical oncology for consult.  Will attempt again another day.   Reginia Naas 12/29/2019, 2:14 PM  Magda Kiel, [place] [phone] 12/29/2019

## 2019-12-29 NOTE — Progress Notes (Signed)
Spoke with RN to notify her of pt needing to be NPO for five hours for MRI to be completed. RN stated pt had dinner around 6:30p and stated it was not fair to make the pt NPO after dinner and wanted to wait until midnight due to the pt having a procedure tomorrow 5/8 that he needed to be NPO for after midnight. I expressed understanding for the RN wanting to wait until after midnight to make pt NPO. I also notified RN that if the pt has anything by mouth right before or at midnight the earliest we can complete the MRI will be around 5/8 @5a . RN expressed understanding and we will check back with RN at midnight to find out when the patient last had anything by mouth.

## 2019-12-30 ENCOUNTER — Inpatient Hospital Stay (HOSPITAL_COMMUNITY): Payer: 59

## 2019-12-30 LAB — BASIC METABOLIC PANEL
Anion gap: 14 (ref 5–15)
BUN: 21 mg/dL — ABNORMAL HIGH (ref 6–20)
CO2: 28 mmol/L (ref 22–32)
Calcium: 9.6 mg/dL (ref 8.9–10.3)
Chloride: 89 mmol/L — ABNORMAL LOW (ref 98–111)
Creatinine, Ser: 0.71 mg/dL (ref 0.61–1.24)
GFR calc Af Amer: >60 mL/min (ref >60)
GFR calc non Af Amer: >60 mL/min (ref >60)
Glucose, Bld: 159 mg/dL — ABNORMAL HIGH (ref 70–99)
Potassium: 5 mmol/L (ref 3.5–5.1)
Sodium: 131 mmol/L — ABNORMAL LOW (ref 135–145)

## 2019-12-30 LAB — GLUCOSE, CAPILLARY
Glucose-Capillary: 204 mg/dL — ABNORMAL HIGH (ref 70–99)
Glucose-Capillary: 127 mg/dL — ABNORMAL HIGH (ref 70–99)
Glucose-Capillary: 128 mg/dL — ABNORMAL HIGH (ref 70–99)
Glucose-Capillary: 300 mg/dL — ABNORMAL HIGH (ref 70–99)
Glucose-Capillary: 248 mg/dL — ABNORMAL HIGH (ref 70–99)

## 2019-12-30 LAB — VITAMIN B12: Vitamin B-12: 1775 pg/mL — ABNORMAL HIGH (ref 180–914)

## 2019-12-30 LAB — IRON AND TIBC
Iron: 43 ug/dL — ABNORMAL LOW (ref 45–182)
Saturation Ratios: 15 % — ABNORMAL LOW (ref 17.9–39.5)
TIBC: 291 ug/dL (ref 250–450)
UIBC: 248 ug/dL

## 2019-12-30 LAB — CBC
HCT: 43 % (ref 39.0–52.0)
Hemoglobin: 13.8 g/dL (ref 13.0–17.0)
MCH: 27.3 pg (ref 26.0–34.0)
MCHC: 32.1 g/dL (ref 30.0–36.0)
MCV: 85 fL (ref 80.0–100.0)
Platelets: 871 10*3/uL — ABNORMAL HIGH (ref 150–400)
RBC: 5.06 MIL/uL (ref 4.22–5.81)
RDW: 14.1 % (ref 11.5–15.5)
WBC: 22.3 10*3/uL — ABNORMAL HIGH (ref 4.0–10.5)
nRBC: 0 % (ref 0.0–0.2)

## 2019-12-30 LAB — FOLATE: Folate: 8.9 ng/mL (ref >5.9)

## 2019-12-30 LAB — FERRITIN: Ferritin: 281 ng/mL (ref 24–336)

## 2019-12-30 MED ORDER — GADOBUTROL 1 MMOL/ML IV SOLN
7.5000 mL | Freq: Once | INTRAVENOUS | Status: AC | PRN
  Administered 2019-12-30: 7.5 mL via INTRAVENOUS

## 2019-12-30 MED ORDER — HYDROCERIN EX CREA
TOPICAL_CREAM | Freq: Two times a day (BID) | CUTANEOUS | Status: DC
  Filled 2019-12-30 (×4): qty 113

## 2019-12-30 NOTE — Progress Notes (Signed)
Patient ID: Gary Hudson, male   DOB: 1965/10/11, 54 y.o.   MRN: [id number] Films reviewed. Will see for full consult.

## 2019-12-30 NOTE — Progress Notes (Signed)
Subjective:  O/N Events: None   [name] was seen at bedside this morning. He states that he is doing okay this morning, but still has persistent headaches. He asks if his results have come back, and we briefly discussed that some of his fungal cultures came back negative. He asked about his brain mass, and we discussed that neurosurgery would evaluate him. He voiced understanding. All questions and concerns were addressed.   Objective:  Vital signs in last 24 hours: Vitals:   12/29/19 0805 12/29/19 1829 12/29/19 2152 12/30/19 0409  BP: 124/71 128/78 119/73 117/64  Pulse: 63 (!) 59 60 (!) 57  Resp: _0 Temp: 97.9 F (36.6 C) 97.7 F (36.5 C) 97.8 F (36.6 C) 98 F (36.7 C)  TempSrc:      SpO2: 94% 96% 96% 96%  Weight:      Height:       Physical Exam Constitutional:      Appearance: He is normal weight. He is ill-appearing.  HENT:     Head: Normocephalic and atraumatic.  Cardiovascular:     Rate and Rhythm: Normal rate and regular rhythm.     Heart sounds: No murmur. No friction rub. No gallop.   Pulmonary:     Effort: Pulmonary effort is normal.     Breath sounds: Normal breath sounds. No decreased breath sounds, wheezing, rhonchi or rales.  Abdominal:     General: Bowel sounds are normal.     Palpations: Abdomen is soft. There is no mass.     Tenderness: There is no abdominal tenderness. There is no guarding.  Neurological:     Mental Status: He is alert.     CBC Latest Ref Rng & Units 12/29/2019 12/28/2019 12/25/2019  WBC 4.0 - 10.5 K/uL 19.0(H) 15.3(H) 11.1(H)  Hemoglobin 13.0 - 17.0 g/dL 12.5(L) 11.8(L) 11.2(L)  Hematocrit 39.0 - 52.0 % 39.1 37.3(L) 35.7(L)  Platelets 150 - 400 K/uL 842(H) 819(H) 643(H)   CMP Latest Ref Rng & Units 12/29/2019 12/28/2019 12/25/2019  Glucose 70 - 99 mg/dL 158(H) 208(H) 160(H)  BUN 6 - 20 mg/dL _1 Creatinine 0.61 - 1.24 mg/dL 0.67 0.70 0.66  Sodium 135 - 145 mmol/L 133(L) 132(L) 134(L)  Potassium 3.5 - 5.1 mmol/L 4.1  4.3 4.2  Chloride 98 - 111 mmol/L 92(L) 91(L) 96(L)  CO2 22 - 32 mmol/L 32 28 26  Calcium 8.9 - 10.3 mg/dL 9.0 9.2 9.1  Total Protein 6.5 - 8.1 g/dL - - 7.1  Total Bilirubin 0.3 - 1.2 mg/dL - - 0.4  Alkaline Phos 38 - 126 U/L - - 429(H)  AST 15 - 41 U/L - - 15  ALT 0 - 44 U/L - - 19   CBG (last 3)  Recent Labs    12/29/19 1951 12/29/19 2355 12/30/19 0406  GLUCAP 201* 156* 204*    Assessment/Plan:  Active Problems:   Uncontrolled type 2 diabetes mellitus with hyperglycemia (HCC)   Essential hypertension   Status post thoracotomy   Poor dentition   Cavitary lesion of lung   History of pleural effusion   Dyslipidemia   Cerebellar mass   Hypothyroid   Normocytic anemia   Unintentional weight loss   Hemoptysis   Dysphagia  Persistent Headaches Right cerebellar Cystic Mass: Likely 2/2 to persistent headaches related to cerebellar mass with mass effect. Patient refused MRI on 12/27/2019. Initial Lab results are coming back negative, will consult with neurosurgery to further assess Gary Hudson cerebellar  mass. Patient completed MRI 5/7 without complications. Imaging revealed a mass centered in right cerebellar hemisphere measuring 4.5x3.9x3.2 cm with layering debris and thing rim enhancement. Favors metastasis vs infection. - Neurosurgery consulted, we appreciate their recommendations.  - Decadron 67m q6h.  - Dilaudid to 1.5 mg Q4H PRN.   Cavitary Lung Disease Infection vs Malignancy: Pt with known pulmonary cavitary lesions of the left lower lobe and numerous cavitary lesions in the bilateral lung. Current every day smoker. Continues to experience hemoptysis, weight loss, and intermittent dyspnea. Overall picture at that time concerning for malignancy vs disseminated fungal/bacterial infection. Completed bronchoalveolar lavage on 12/26/2019. Results of his labs have been negative, and a CT guided biopsy of the LLL cavity lesion is scheduled for 12/29/2019. Biopsy not performed.  -  CT Guided biopsy of the left lower lobe not completed on 12/29/19. - Oncology consulted, we appreciate their recommendations.  - Ordered MRI abdomen and pelvis per oncology's recommendatio  - Pulmonary consulted, we appreciate their recommendations.  - Infectious Disease consulted, we appreciate their recommendations. - Body Fluid cell count: Colorless, clear, monocyte-macrophage-serous Fluid 93 (50-90%) - Acid Fast Culture: In Process - Acid Fast smear: Negative - Gram Stain: No WBC seen, no organisms seen - Anaerobic culture: NGTD  - Cytology- Non PAP: No malignant cells identified, Benign reactive/reparative changes - Fungitell: Negative - Aspergillus Ag: Active - OT recommended 3 in 1 bedside commode  Type II Diabetes - Uncontrolled A1c 13.5 on admission. Pt on glimepiride 456mdaily and metformin 10003mID at home. CBGs continue to be elevated due to decadron, will increase basal insulin dose.  - SSI Resistant. - Lantus 15U  Hypertension: - Losartan 41m7m - HCZ 12.5mg 44m Hypothyroidism - Levothyroxine 25mcg14m Normocytic Anemia:  Iron studies ordered showing a decreased Iron (43) and saturation (15) with normal ferritin and slight decrease in hemoglobin is suggestive of chronic disease likely chronic infection vs malignancy.    Prior to Admission Living Arrangement: Home Anticipated Discharge Location: Home Barriers to Discharge: Continued Medical workup Dispo: Anticipated discharge in approximately 2-3 days   WinterMaudie Mercury/03/2020, 5:50 AM Pager: [phone]

## 2019-12-31 LAB — BASIC METABOLIC PANEL
Anion gap: 10 (ref 5–15)
BUN: 20 mg/dL (ref 6–20)
CO2: 31 mmol/L (ref 22–32)
Calcium: 9.1 mg/dL (ref 8.9–10.3)
Chloride: 89 mmol/L — ABNORMAL LOW (ref 98–111)
Creatinine, Ser: 0.67 mg/dL (ref 0.61–1.24)
GFR calc Af Amer: >60 mL/min (ref >60)
GFR calc non Af Amer: >60 mL/min (ref >60)
Glucose, Bld: 150 mg/dL — ABNORMAL HIGH (ref 70–99)
Potassium: 4.3 mmol/L (ref 3.5–5.1)
Sodium: 130 mmol/L — ABNORMAL LOW (ref 135–145)

## 2019-12-31 LAB — CBC
HCT: 43.1 % (ref 39.0–52.0)
Hemoglobin: 13.7 g/dL (ref 13.0–17.0)
MCH: 26.6 pg (ref 26.0–34.0)
MCHC: 31.8 g/dL (ref 30.0–36.0)
MCV: 83.7 fL (ref 80.0–100.0)
Platelets: 767 10*3/uL — ABNORMAL HIGH (ref 150–400)
RBC: 5.15 MIL/uL (ref 4.22–5.81)
RDW: 14 % (ref 11.5–15.5)
WBC: 26.4 10*3/uL — ABNORMAL HIGH (ref 4.0–10.5)
nRBC: 0 % (ref 0.0–0.2)

## 2019-12-31 LAB — GLUCOSE, CAPILLARY
Glucose-Capillary: 145 mg/dL — ABNORMAL HIGH (ref 70–99)
Glucose-Capillary: 263 mg/dL — ABNORMAL HIGH (ref 70–99)
Glucose-Capillary: 285 mg/dL — ABNORMAL HIGH (ref 70–99)
Glucose-Capillary: 306 mg/dL — ABNORMAL HIGH (ref 70–99)
Glucose-Capillary: 390 mg/dL — ABNORMAL HIGH (ref 70–99)

## 2019-12-31 MED ORDER — HYDROMORPHONE HCL 1 MG/ML IJ SOLN
1.0000 mg | INTRAMUSCULAR | Status: DC | PRN
  Administered 2019-12-31 – 2020-01-09 (×54): 1 mg via INTRAVENOUS
  Filled 2019-12-31 (×57): qty 1

## 2019-12-31 MED ORDER — POLYETHYLENE GLYCOL 3350 17 G PO PACK
17.0000 g | PACK | Freq: Every day | ORAL | Status: DC
  Administered 2020-01-01 – 2020-01-10 (×8): 17 g via ORAL
  Filled 2019-12-31 (×12): qty 1

## 2019-12-31 MED ORDER — DIPHENHYDRAMINE HCL 50 MG/ML IJ SOLN
25.0000 mg | Freq: Four times a day (QID) | INTRAMUSCULAR | Status: DC | PRN
  Administered 2019-12-31 – 2020-01-04 (×3): 25 mg via INTRAVENOUS
  Filled 2019-12-31 (×3): qty 1

## 2019-12-31 MED ORDER — INSULIN GLARGINE 100 UNIT/ML ~~LOC~~ SOLN
18.0000 [IU] | Freq: Every day | SUBCUTANEOUS | Status: DC
  Administered 2020-01-01: 18 [IU] via SUBCUTANEOUS
  Filled 2019-12-31: qty 0.18

## 2019-12-31 MED ORDER — DIPHENHYDRAMINE HCL 50 MG/ML IJ SOLN
INTRAMUSCULAR | Status: AC
  Filled 2019-12-31: qty 1

## 2019-12-31 NOTE — Progress Notes (Signed)
Internal Medicine Attending Note:  This patient's plan of care was discussed with the house staff. Please see their note for complete details. I concur with their findings.  Still awaiting neurosurgery evaluation. Appreciate recommendations. Will call again tomorrow if not seen.   Velna Ochs, MD 12/31/2019, 4:29 PM

## 2019-12-31 NOTE — Progress Notes (Signed)
Subjective: The patient is seen and examined today.  He is feeling fine with no concerning complaints except for pain on the right neck area.  He is expected to have brain surgery tomorrow.  He denied having any current fever or chills.  He has no nausea, vomiting, diarrhea or constipation.  Objective: Vital signs in last 24 hours: Temp:  [97.4 F (36.3 C)-98.9 F (37.2 C)] 98.9 F (37.2 C) (05/09 0827) Pulse Rate:  [70-86] 86 (05/09 0827) Resp:  [16-20] 20 (05/09 0827) BP: (109-114)/(64-102) 114/102 (05/09 0827) SpO2:  [95 %-99 %] 99 % (05/09 0827)  Intake/Output from previous day: No intake/output data recorded. Intake/Output this shift: No intake/output data recorded.  General appearance: alert, cooperative and no distress Resp: rales bilaterally Cardio: regular rate and rhythm, S1, S2 normal, no murmur, click, rub or gallop GI: soft, non-tender; bowel sounds normal; no masses,  no organomegaly Extremities: extremities normal, atraumatic, no cyanosis or edema  Lab Results:  Recent Labs    12/30/19 0601 12/31/19 0354  WBC 22.3* 26.4*  HGB 13.8 13.7  HCT 43.0 43.1  PLT 871* 767*   BMET Recent Labs    12/30/19 0601 12/31/19 0354  NA 131* 130*  K 5.0 4.3  CL 89* 89*  CO2 28 31  GLUCOSE 159* 150*  BUN 21* 20  CREATININE 0.71 0.67  CALCIUM 9.6 9.1    Studies/Results: MR PELVIS W WO CONTRAST  Result Date: 12/30/2019 CLINICAL DATA:  Cavitary LEFT upper lobe pulmonary lesion. Posterior fossa brain lesion. EXAM: MRI ABDOMEN AND PELVIS WITHOUT AND WITH CONTRAST TECHNIQUE: Multiplanar multisequence MR imaging of the abdomen and pelvis was performed both before and after the administration of intravenous contrast. CONTRAST:  7.25m GADAVIST GADOBUTROL 1 MMOL/ML IV SOLN COMPARISON:  CT chest 12/24/2019, brain MRI 12/28/2019 FINDINGS: COMBINED FINDINGS FOR BOTH MR ABDOMEN AND PELVIS Lower chest: Large cavitary mass again noted in the LEFT lower lobe. Hepatobiliary: No enhancing  hepatic lesion. Gallbladder normal. No biliary duct dilatation. Pancreas: Pancreas is normal. No ductal dilatation. No pancreatic inflammation. Spleen: Normal spleen Adrenals/urinary tract: Adrenal glands are normal. Cystic lesion in the cortex of the RIGHT kidney measures 1.5 cm (image 24/series 5). Cystic lesion has mild complexity on the T2 weighted imaging however has no post-contrast enhancement by direct intensity measurement (series 15, series 17) as well as by a subtraction imaging (series 18) Stomach/Bowel: Stomach and duodenum are normal. Small bowel normal. Moderate volume stool throughout the colon. No obstructing lesion identified. Vascular/Lymphatic: Abdominal aorta is normal caliber. No periportal or retroperitoneal adenopathy. No pelvic adenopathy. Reproductive: Prostate normal. Other: No free fluid. Musculoskeletal: No aggressive osseous lesion identified. IMPRESSION: 1. No evidence of malignancy in the abdomen pelvis by MRI imaging. 2. No evidence of bowel lesion. There is some limitation in bowel evaluation with MRI. 3. Normal adrenal glands. 4. 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 Again demonstrated, large cavitary process in the LEFT lower lobe. Electronically Signed   By: SSuzy BouchardM.D.   On: 12/30/2019 09:57   MR ABDOMEN W WO CONTRAST  Result Date: 12/30/2019 CLINICAL DATA:  Cavitary LEFT upper lobe pulmonary lesion. Posterior fossa brain lesion. EXAM: MRI ABDOMEN AND PELVIS WITHOUT AND WITH CONTRAST TECHNIQUE: Multiplanar multisequence MR imaging of the abdomen and pelvis was performed both before and after the administration of intravenous contrast. CONTRAST:  7.539mGADAVIST GADOBUTROL 1 MMOL/ML IV SOLN COMPARISON:  CT chest 12/24/2019, brain MRI 12/28/2019 FINDINGS: COMBINED FINDINGS FOR BOTH MR ABDOMEN AND PELVIS Lower  chest: Large cavitary mass again noted in the LEFT lower lobe. Hepatobiliary: No enhancing hepatic lesion. Gallbladder normal. No biliary duct  dilatation. Pancreas: Pancreas is normal. No ductal dilatation. No pancreatic inflammation. Spleen: Normal spleen Adrenals/urinary tract: Adrenal glands are normal. Cystic lesion in the cortex of the RIGHT kidney measures 1.5 cm (image 24/series 5). Cystic lesion has mild complexity on the T2 weighted imaging however has no post-contrast enhancement by direct intensity measurement (series 15, series 17) as well as by a subtraction imaging (series 18) Stomach/Bowel: Stomach and duodenum are normal. Small bowel normal. Moderate volume stool throughout the colon. No obstructing lesion identified. Vascular/Lymphatic: Abdominal aorta is normal caliber. No periportal or retroperitoneal adenopathy. No pelvic adenopathy. Reproductive: Prostate normal. Other: No free fluid. Musculoskeletal: No aggressive osseous lesion identified. IMPRESSION: 1. No evidence of malignancy in the abdomen pelvis by MRI imaging. 2. No evidence of bowel lesion. There is some limitation in bowel evaluation with MRI. 3. Normal adrenal glands. 4. 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 Again demonstrated, large cavitary process in the LEFT lower lobe. Electronically Signed   By: Suzy Bouchard M.D.   On: 12/30/2019 09:57    Medications: I have reviewed the patient's current medications.   Assessment/Plan: This is a very pleasant 54 years old white male with multiple cavitary lung lesions in addition to cystic lesion in the brain.  These findings are highly suspicious for metastatic malignancy especially squamous cell carcinoma but inflammatory process cannot be completely excluded at this point. The patient underwent bronchoscopy twice that were negative for malignancy. I agree with the surgical resection of the cystic brain lesion for tissue diagnosis and also removal of the solitary lesion in the brain. The patient may benefit from a PET scan on outpatient basis for further identification of the pulmonary lesion. If the  final pathology is consistent with squamous cell carcinoma of the lung, the patient would definitely benefit from treatment with a combination of systemic chemotherapy with carboplatin, paclitaxel and Keytruda or only single agent Keytruda if PD-L1 expression is over 50%. The patient is followed by Dr. Marin Olp who is providing him with great care.  He will see him tomorrow. Please call if you have any questions.     LOS: 7 days    Eilleen Kempf 12/31/2019

## 2019-12-31 NOTE — Progress Notes (Signed)
Subjective: No acute events overnight. [name] continues to have a persistent headache that he thinks has worsened. He gets dizzy with standing. No photophobia, nausea, vomiting. Tolerating PO well. He has not had a BM yet and is becoming uncomfortable. I discussed plan to address bowel regimen and attempt pain control for his headache until further evaluation can be done.   Objective:  Vital signs in last 24 hours: Vitals:   12/30/19 0409 12/30/19 0845 12/30/19 1805 12/30/19 2021  BP: 117/64 128/87 109/64 111/77  Pulse: (!) 57 74 71 70  Resp: _0 Temp: 98 F (36.7 C) 98.4 F (36.9 C) (!) 97.4 F (36.3 C) 98.2 F (36.8 C)  TempSrc:      SpO2: 96% 93% 95% 97%  Weight: 71.7 kg     Height:       General: ill-appearing, NAD CV: RRR Pulm: normal work of breathing on room air; lungs CTAB Abd: Soft, non-distended, non-tender Neuro: bilateral nystagmus  Assessment/Plan:  Active Problems:   Uncontrolled type 2 diabetes mellitus with hyperglycemia (HCC)   Essential hypertension   Status post thoracotomy   Poor dentition   Cavitary lesion of lung   History of pleural effusion   Dyslipidemia   Cerebellar mass   Hypothyroid   Normocytic anemia   Unintentional weight loss   Hemoptysis   Dysphagia  Persistent Headaches Right cerebellar Cystic Mass: Patient admitted with persistent headaches are most likely related to mass effect from cerebellar mass. Symptoms have been difficult to control thus far. We obtained an MRI on 12/29/19 to further characterize the mass which revealed a mass centered in right cerebellar hemisphere measuring 4.5x3.9x3.2 cm with layering debris and thin rim enhancement. Favors metastasis vs infection. - Neurosurgery consulted, we appreciate their recommendations.  - Continue symptomatic management with Decadron 81m q6h. Increase dilaudid frequency to 1 mg q 2 hrs to attempt better pain control   Cavitary Lung Disease Infection vs  Malignancy: Pt with known pulmonary cavitary lesions of the left lower lobe and numerous cavitary lesions in the bilateral lung. Current every day smoker. Continues to experience hemoptysis, weight loss, and intermittent dyspnea. Overall picture at that time concerning for malignancy vs disseminated fungal/bacterial infection. Pulmonology was consulted and completed bronchoalveolar lavage on 12/26/2019. Results of his labs have been negative. Pulm coordinated with IR to arrange for a CT guided biopsy of the LLL cavity lesion.  We also consulted oncology to review this challenging case. It was thought that this would be a very unusual presentation of lung cancer. Recommended MRI abdomen/pelvis to look for other evidence of underlying malignancy which was negative.  The hope and consensus is that we will know more information once the cerebellar mass is resected or biopsied given that work-up of the cavitary lung lesions has been unrevealing.   Type II Diabetes - Uncontrolled A1c 13.5 on admission. Pt on glimepiride 457mdaily and metformin 100081mID at home. CBGs continue to be elevated due to decadron, will increase basal insulin dose.  - SSI Resistant. - Lantus 18U  Hypertension: - Losartan 37m24m - HCZ 12.5mg 40m Hypothyroidism - Levothyroxine 25mcg68m Normocytic Anemia:  Iron studies ordered showing a decreased Iron (43) and saturation (15) with normal ferritin and slight decrease in hemoglobin is suggestive of chronic disease likely chronic infection vs malignancy.   Prior to Admission Living Arrangement: home Anticipated Discharge Location: home Barriers to Discharge: continued medical work-up  Dispo: Anticipated discharge in approximately 3-4 day(s).  Modena Nunnery D, DO 12/31/2019, 6:04 AM Pager: [phone]

## 2020-01-01 ENCOUNTER — Other Ambulatory Visit: Payer: Self-pay

## 2020-01-01 ENCOUNTER — Inpatient Hospital Stay (HOSPITAL_COMMUNITY): Payer: 59

## 2020-01-01 DIAGNOSIS — Z8709 Personal history of other diseases of the respiratory system: Secondary | ICD-10-CM

## 2020-01-01 DIAGNOSIS — R1312 Dysphagia, oropharyngeal phase: Secondary | ICD-10-CM

## 2020-01-01 DIAGNOSIS — E1165 Type 2 diabetes mellitus with hyperglycemia: Secondary | ICD-10-CM

## 2020-01-01 LAB — GLUCOSE, CAPILLARY
Glucose-Capillary: 103 mg/dL — ABNORMAL HIGH (ref 70–99)
Glucose-Capillary: 162 mg/dL — ABNORMAL HIGH (ref 70–99)
Glucose-Capillary: 163 mg/dL — ABNORMAL HIGH (ref 70–99)
Glucose-Capillary: 165 mg/dL — ABNORMAL HIGH (ref 70–99)
Glucose-Capillary: 183 mg/dL — ABNORMAL HIGH (ref 70–99)
Glucose-Capillary: 189 mg/dL — ABNORMAL HIGH (ref 70–99)
Glucose-Capillary: 231 mg/dL — ABNORMAL HIGH (ref 70–99)

## 2020-01-01 LAB — ANAEROBIC CULTURE: Gram Stain: NONE SEEN

## 2020-01-01 MED ORDER — MIDAZOLAM HCL 2 MG/2ML IJ SOLN
INTRAMUSCULAR | Status: AC
  Filled 2020-01-01: qty 2

## 2020-01-01 MED ORDER — FENTANYL CITRATE (PF) 100 MCG/2ML IJ SOLN
INTRAMUSCULAR | Status: AC | PRN
  Administered 2020-01-01: 25 ug via INTRAVENOUS
  Administered 2020-01-01: 50 ug via INTRAVENOUS

## 2020-01-01 MED ORDER — MIDAZOLAM HCL 2 MG/2ML IJ SOLN
INTRAMUSCULAR | Status: AC | PRN
  Administered 2020-01-01: 1 mg via INTRAVENOUS
  Administered 2020-01-01: 0.5 mg via INTRAVENOUS

## 2020-01-01 MED ORDER — INSULIN GLARGINE 100 UNIT/ML ~~LOC~~ SOLN
21.0000 [IU] | Freq: Every day | SUBCUTANEOUS | Status: DC
  Administered 2020-01-02 – 2020-01-03 (×2): 21 [IU] via SUBCUTANEOUS
  Filled 2020-01-01 (×3): qty 0.21

## 2020-01-01 MED ORDER — FENTANYL CITRATE (PF) 100 MCG/2ML IJ SOLN
INTRAMUSCULAR | Status: AC
  Filled 2020-01-01: qty 2

## 2020-01-01 MED ORDER — LIDOCAINE HCL 1 % IJ SOLN
INTRAMUSCULAR | Status: AC
  Filled 2020-01-01: qty 20

## 2020-01-01 NOTE — Progress Notes (Signed)
Subjective:  O/N Events: None Mr. Auriemma was seen at bedside this morning. Mr. Angelino states that he feels groggy after his biopsy. Patient states that changing the frequency of his dilaudid is helping but his headache is still resistant. We discussed his recent labs, which are negative. We also discussed awaiting the labs from his biopsy this morning.   Objective:  Vital signs in last 24 hours: Vitals:   12/30/19 2021 12/31/19 0827 12/31/19 1653 01/01/20 0124  BP: 111/77 (!) 114/102 120/73 132/79  Pulse: 70 86 76 79  Resp: _0 Temp: 98.2 F (36.8 C) 98.9 F (37.2 C) 97.9 F (36.6 C) 97.8 F (36.6 C)  TempSrc:      SpO2: 97% 99% 97% 95%  Weight:      Height:       Physical Exam Constitutional:      Appearance: He is ill-appearing. He is not diaphoretic.  HENT:     Head: Normocephalic and atraumatic.  Eyes:     Comments: Bilateral nystagmus noted.   Cardiovascular:     Rate and Rhythm: Normal rate and regular rhythm.     Heart sounds: No murmur. No friction rub.  Pulmonary:     Effort: Pulmonary effort is normal.     Breath sounds: Normal breath sounds.  Abdominal:     General: Bowel sounds are normal.     Palpations: Abdomen is soft.     Tenderness: There is no guarding.  Musculoskeletal:     Right lower leg: No tenderness. No edema.     Left lower leg: No tenderness. No edema.  Skin:    General: Skin is warm and dry.  Neurological:     Mental Status: He is alert and oriented to person, place, and time.     Assessment/Plan:  Active Problems:   Uncontrolled type 2 diabetes mellitus with hyperglycemia (HCC)   Essential hypertension   Status post thoracotomy   Poor dentition   Cavitary lesion of lung   History of pleural effusion   Dyslipidemia   Cerebellar mass   Hypothyroid   Normocytic anemia   Unintentional weight loss   Hemoptysis   Dysphagia  Persistent Headaches Right Cerebellar Cystic Mass: Patient admitted with persistent  headaches are most likely related to mass effect from cerebellar mass. Symptoms have been difficult to control thus far. We obtained an MRI on 12/29/19 to further characterize the mass which revealed a mass centered in right cerebellar hemisphere measuring 4.5x3.9x3.2 cm with layering debris and thin rim enhancement. Favors metastasis vs infection. - Neurosurgery consulted. We appreciate their recommendations.  - Decadron 32m Q6H.  - Dilaudid 196mQ2H  Cavitary Lung Disease Infection vs Malignancy: Pt with known pulmonary cavitary lesions of the left lower lobe and numerous cavitary lesions in the bilateral lung. Current every day smoker. Continues to experience hemoptysis, weight loss, and intermittent dyspnea. Overall picture at that time concerning for malignancy vs disseminated fungal/bacterial infection. Pulmonology was consulted and completed bronchoalveolar lavage on 12/26/2019. Results of his labs have been negative. Pulm coordinated with IR to arrange for a CT guided biopsy of the LLL cavity lesion. Oncology was also consulted to assess this challenging case. They thought this would be a very unusual presentation for lung cancer. They recommended an MRI abdomen/pelvis for underlying malignancy, but the MRI came back negative. The hope and consensus is that we will have more data once the cerebellar mass is resected/biopsied as current medical workup of lung lesions has  been negative. - Patient underwent LLL CT Bx today.   - Surgical Pathology: Active - Aerobic/Anaerobic: Active - Fungus Culture: Active - Acid Fast Smear: Active - Acid Fast Culture: Active  Type II Diabetes - Uncontrolled A1c 13.5 on admission. Pt on glimepiride 20m daily and metformin 10069mBID at home. CBGs continue to be elevated due to decadron, will increase basal insulin dose.  - SSI Resistant. - Lantus increased to 21U  Hypertension: - Losartan 5042mD - HCZ 12.5mg42m  Hypothyroidism - Levothyroxine 25mc55m   Normocytic Anemia:  Iron studies ordered showing a decreased Iron (43) and saturation (15) with normal ferritin and slight decrease in hemoglobin is suggestive of chronic disease likely chronic infection vs malignancy.   Prior to Admission Living Arrangement: home Anticipated Discharge Location: home Barriers to Discharge: continued medical work-up  Dispo: Anticipated discharge in approximately 3-4 day(s).   WinteMaudie Mercury5/05/2020, 6:34 AM Pager: [phone]

## 2020-01-01 NOTE — Progress Notes (Signed)
This chaplain responded to RN-Woody request to deliver AD brochure to Pt.  The Pt. sister and girlfriend were bedside at the time of the chaplain visit.  The chaplain explained to the Pt. sister with the Pt. permission a consult was placed for AD Pt. Education. A follow up is necessary to complete the AD.  The Pt. sister thanked the chaplain for the opportunity to read the document before the meeting.

## 2020-01-01 NOTE — Progress Notes (Signed)
Inpatient Diabetes Program Recommendations  AACE/ADA: New Consensus Statement on Inpatient Glycemic Control   Target Ranges:  Prepandial:   less than 140 mg/dL      Peak postprandial:   less than 180 mg/dL (1-2 hours)      Critically ill patients:  140 - 180 mg/dL   Results for Gary Hudson, Gary Hudson (MRN [id number]) as of 01/01/2020 10:28  Ref. Range 12/31/2019 08:24 12/31/2019 12:12 12/31/2019 16:50 12/31/2019 20:58 01/01/2020 00:46 01/01/2020 04:12 01/01/2020 07:57  Glucose-Capillary Latest Ref Range: 70 - 99 mg/dL 306 (H)  Novolog 11 units  Lantus 15 units 263 (H)  Novolog 11 units 390 (H)  Novolog 20 units 285 (H)  Novolog 11 units 231 (H)  Novolog 7 units 163 (H)  Novolog 4 units 103 (H)     Review of Glycemic Control  Diabetes history: DM2 Outpatient Diabetes medications: Lantus 30 units daily, Amaryl 4 mg daily, Metformin 1000 mg QAM, Metformin 500 mg QPM Current orders for Inpatient glycemic control: Lantus 18 units daily, Novolog 0-20 units Q4H; Decadron 4 mg Q6H  Inpatient Diabetes Program Recommendations:   Insulin-Basal: If steroids are continued, please consider increasing Lantus to 21 units daily.  Insulin-Meal Coverage: If steroids are continued, please consider ordering Novolog 8 units TID with meals for meal coverage if patient eats at least 50% of meals.  Thanks, Barnie Alderman, RN, MSN, CDE Diabetes Coordinator Inpatient Diabetes Program [phone] (Team Pager from 8am to 5pm)

## 2020-01-01 NOTE — Progress Notes (Signed)
OT Cancellation Note  Patient Details Name: Gary Hudson MRN: [id number] DOB: [birth year]   Cancelled Treatment:    Reason Eval/Treat Not Completed: Patient declined, no reason specified; pt reports too fatigued to attempt participating in therapies today, declined ADL/EOB activity. Will follow up as able.  Lou Cal, OT Acute Rehabilitation Services Pager [phone] Office [phone]   Raymondo Band 01/01/2020, 4:23 PM

## 2020-01-01 NOTE — Progress Notes (Signed)
Subjective: Pt did not respond to questions but family had questions   Antibiotics:  Anti-infectives (From admission, onward)   None      Medications: Scheduled Meds: . atorvastatin  20 mg Oral Daily  . dexamethasone  4 mg Oral Q6H  . feeding supplement (ENSURE ENLIVE)  237 mL Oral TID BM  . fentaNYL      . hydrocerin   Topical BID  . losartan  50 mg Oral Daily   And  . hydrochlorothiazide  12.5 mg Oral Daily  . insulin aspart  0-20 Units Subcutaneous Q4H  . insulin glargine  18 Units Subcutaneous Daily  . levothyroxine  25 mcg Oral Q0600  . lidocaine      . midazolam      . multivitamin with minerals  1 tablet Oral Daily  . nicotine  14 mg Transdermal Daily  . polyethylene glycol  17 g Oral Daily  . senna-docusate  2 tablet Oral BID  . sodium chloride flush  3 mL Intravenous Q12H   Continuous Infusions: PRN Meds:.acetaminophen **OR** acetaminophen, diphenhydrAMINE, HYDROmorphone (DILAUDID) injection, ondansetron **OR** ondansetron (ZOFRAN) IV    Objective: Weight change:   Intake/Output Summary (Last 24 hours) at 01/01/2020 1127 Last data filed at 12/31/2019 1430 Gross per 24 hour  Intake 360 ml  Output --  Net 360 ml   Blood pressure 123/70, pulse 87, temperature 97.8 F (36.6 C), resp. rate (!) 21, height 5\' 11"  (1.803 m), weight 71.7 kg, SpO2 96 %. Temp:  [97.8 F (36.6 C)-97.9 F (36.6 C)] 97.8 F (36.6 C) (05/10 0124) Pulse Rate:  [75-87] 87 (05/10 0925) Resp:  [11-21] 21 (05/10 0925) BP: (110-132)/(69-107) 123/70 (05/10 0925) SpO2:  [94 %-100 %] 96 % (05/10 0925)  Physical Exam: General: Alert ot in any acute distress. HEENT: anicteric sclera, EOMI CVS regular rate, normal  Chest: , no wheezing, no respiratory distress Abdomen: soft non-distended,  Extremities: no edema or deformity noted bilaterally Skin: no rashes  CBC:    BMET Recent Labs    12/30/19 0601 12/31/19 0354  NA 131* 130*  K 5.0 4.3  CL 89* 89*  CO2 28 31   GLUCOSE 159* 150*  BUN 21* 20  CREATININE 0.71 0.67  CALCIUM 9.6 9.1     Liver Panel  No results for input(s): PROT, ALBUMIN, AST, ALT, ALKPHOS, BILITOT, BILIDIR, IBILI in the last 72 hours.     Sedimentation Rate No results for input(s): ESRSEDRATE in the last 72 hours. C-Reactive Protein No results for input(s): CRP in the last 72 hours.  Micro Results: Recent Results (from the past 720 hour(s))  Respiratory Panel by RT PCR (Flu A&B, Covid) - Nasopharyngeal Swab     Status: None   Collection Time: 12/24/19  4:57 PM   Specimen: Nasopharyngeal Swab  Result Value Ref Range Status   SARS Coronavirus 2 by RT PCR NEGATIVE NEGATIVE Final    Comment: (NOTE) SARS-CoV-2 target nucleic acids are NOT DETECTED. The SARS-CoV-2 RNA is generally detectable in upper respiratoy specimens during the acute phase of infection. The lowest concentration of SARS-CoV-2 viral copies this assay can detect is 131 copies/mL. A negative result does not preclude SARS-Cov-2 infection and should not be used as the sole basis for treatment or other patient management decisions. A negative result may occur with  improper specimen collection/handling, submission of specimen other than nasopharyngeal swab, presence of viral mutation(s) within the areas targeted by this assay, and inadequate number of  viral copies (<131 copies/mL). A negative result must be combined with clinical observations, patient history, and epidemiological information. The expected result is Negative. Fact Sheet for Patients:  PinkCheek.be Fact Sheet for Healthcare Providers:  GravelBags.it This test is not yet ap proved or cleared by the [place] FDA and  has been authorized for detection and/or diagnosis of SARS-CoV-2 by FDA under an Emergency Use Authorization (EUA). This EUA will remain  in effect (meaning this test can be used) for the duration of the COVID-19  declaration under Section 564(b)(1) of the Act, 21 U.S.C. section 360bbb-3(b)(1), unless the authorization is terminated or revoked sooner.    Influenza A by PCR NEGATIVE NEGATIVE Final   Influenza B by PCR NEGATIVE NEGATIVE Final    Comment: (NOTE) The Xpert Xpress SARS-CoV-2/FLU/RSV assay is intended as an aid in  the diagnosis of influenza from Nasopharyngeal swab specimens and  should not be used as a sole basis for treatment. Nasal washings and  aspirates are unacceptable for Xpert Xpress SARS-CoV-2/FLU/RSV  testing. Fact Sheet for Patients: PinkCheek.be Fact Sheet for Healthcare Providers: GravelBags.it This test is not yet approved or cleared by the [place] FDA and  has been authorized for detection and/or diagnosis of SARS-CoV-2 by  FDA under an Emergency Use Authorization (EUA). This EUA will remain  in effect (meaning this test can be used) for the duration of the  Covid-19 declaration under Section 564(b)(1) of the Act, 21  U.S.C. section 360bbb-3(b)(1), unless the authorization is  terminated or revoked. Performed at [facility], [place] [address] [place], [place] [address]   Anaerobic culture     Status: None   Collection Time: 12/26/19 10:53 AM   Specimen: Bronchial Alveolar Lavage; Lung  Result Value Ref Range Status   Specimen Description BRONCHIAL ALVEOLAR LAVAGE  Final   Special Requests NONE  Final   Gram Stain NO WBC SEEN NO ORGANISMS SEEN   Final   Culture   Final    NO ANAEROBES ISOLATED Performed at [facility], [address] [place], [place] [address]    Report Status 01/01/2020 FINAL  Final  Acid Fast Smear (AFB)     Status: None   Collection Time: 12/26/19 10:53 AM   Specimen: Bronchial Alveolar Lavage; Lung  Result Value Ref Range Status   AFB Specimen Processing Concentration  Final   Acid Fast Smear Negative  Final    Comment: (NOTE) Performed At: [facility] [place], [place] [id number] Rush Farmer MD SA:[id number]    Source (AFB) BRONCHIAL ALVEOLAR LAVAGE  Final    Studies/Results: CT guided needle placement  Result Date: 01/01/2020 INDICATION: Left lower lobe cavitary mass EXAM: CT-GUIDED LEFT LOWER LOBE CAVITARY MASS CORE BIOPSY MEDICATIONS: 1% lidocaine local ANESTHESIA/SEDATION: Moderate (conscious) sedation was employed during this procedure. A total of Versed 1.5 mg and Fentanyl 75 mcg was administered intravenously. Moderate Sedation Time: 13 minutes the patient's level of consciousness and vital signs were monitored continuously by radiology nursing throughout the procedure under my direct supervision. FLUOROSCOPY TIME:  Fluoroscopy Time: None. COMPLICATIONS: None immediate. PROCEDURE: Informed written consent was obtained from the patient after a thorough discussion of the procedural risks, benefits and alternatives. All questions were addressed. Maximal Sterile Barrier Technique was utilized including caps, mask, sterile gowns, sterile gloves, sterile drape, hand hygiene and skin antiseptic. A timeout was performed prior to the initiation of the procedure. Previous imaging reviewed. Patient position prone. Noncontrast localization CT performed. The medial  left lower lobe cavitary mass was localized and marked for a posterior paraspinous approach. Under sterile conditions and local anesthesia, a 17 gauge 6.8 cm guide needle was advanced to the medial cavitary lesion margin. Needle position confirmed with CT. 18 gauge core biopsies obtained and placed in saline. Sample sent for pathology and pan culture. Needle tract occluded with the bio sentry device. Postprocedure imaging demonstrates no hemorrhage or hematoma. Patient tolerated biopsy well. IMPRESSION: Successful CT-guided core biopsy of the left lower lobe cavitary mass Electronically Signed   By: Jerilynn Mages.  Shick M.D.   On: 01/01/2020 09:51       Assessment/Plan:  INTERVAL HISTORY: IR guided biopsy this am   Active Problems:   Uncontrolled type 2 diabetes mellitus with hyperglycemia (HCC)   Essential hypertension   Status post thoracotomy   Poor dentition   Cavitary lesion of lung   History of pleural effusion   Dyslipidemia   Cerebellar mass   Hypothyroid   Normocytic anemia   Unintentional weight loss   Hemoptysis   Dysphagia    Gary Hudson is a 54 y.o. male with cavitary process in the lung in the cerebellum.  This is concerning for malignancy but it would be nice if it was infection.  Await tissue pathology report from lungs.  If this is nondiagnostic I expect he will need neurosurgery vs to help [place] solve this problem.  We will follow peripherally until we have tissue diagnosis   LOS: 8 days   Alcide Evener 01/01/2020, 11:27 AM

## 2020-01-01 NOTE — Procedures (Signed)
Interventional Radiology Procedure Note  Procedure: LLL CT BX  Complications: None  Estimated Blood Loss: NONE  Findings: 18 G CORES OF LLL CAVITARY LESION

## 2020-01-01 NOTE — Consult Note (Signed)
Reason for Consult:brain tumor Referring Physician: Willia, Gary Hudson is an 54 y.o. male.  HPI: with a 50month history severe headaches admitted on 12/24/19. He had recently been admitted to [facility] which he left ama. He was worked up for lung lesions, weight loss, uncontrolled Diabetes mellitus, hypothyrodism, vocal cord paralysis, and possible TB. After leaving AMA he presented to the [facility] ED with worsening headaches, and failure to thrive. Multiple biopsies of lung lesions have been performed. 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 He characterizes his coordination as being worse on the left side. Mass effect is present without edema. Enhancement is in the rim only, no other lesions identified on the MRI brain. Given lack of diagnosis, and the obvious mass effect Gary Hudson is likely to go to the operating room for resection. However given the very little tissue that enhances diagnosis may remain a challenge. My partner was originally consulted about the patient. Outpatient evaluation was recommended since there was no hydrocephalus and workup was not complete.   Past Medical History:  Diagnosis Date  . Diabetes mellitus without complication [facility])     Past Surgical History:  Procedure Laterality Date  . BRONCHIAL BRUSHINGS  12/26/2019   Procedure: BRONCHIAL BRUSHINGS;  Surgeon: Spero Geralds, MD;  Location: [facility];  Service: Pulmonary;;  . BRONCHIAL WASHINGS  12/26/2019   Procedure: BRONCHIAL WASHINGS;  Surgeon: Spero Geralds, MD;  Location: [facility] ENDOSCOPY;  Service: Pulmonary;;  . TEE WITHOUT CARDIOVERSION N/A 01/26/2019   Procedure: TRANSESOPHAGEAL ECHOCARDIOGRAM (TEE);  Surgeon: Prescott Gum, Collier Salina, MD;  Location: [facility] OR;  Service: Thoracic;  Laterality: N/A;  . VIDEO ASSISTED THORACOSCOPY (VATS)/EMPYEMA Left 01/26/2019   Procedure: VIDEO ASSISTED THORACOSCOPY (VATS)/DRAINAGE OF EMPYEMA  AND DECORTICATION;  Surgeon: Ivin Poot,  MD;  Location: [facility];  Service: Thoracic;  Laterality: Left;  Marland Kitchen VIDEO BRONCHOSCOPY N/A 01/26/2019   Procedure: VIDEO BRONCHOSCOPY;  Surgeon: Prescott Gum, Collier Salina, MD;  Location: [place];  Service: Thoracic;  Laterality: N/A;  . VIDEO BRONCHOSCOPY N/A 12/26/2019   Procedure: VIDEO BRONCHOSCOPY WITHOUT FLUORO;  Surgeon: Spero Geralds, MD;  Location: [facility] ENDOSCOPY;  Service: Pulmonary;  Laterality: N/A;    Family History  Problem Relation Age of Onset  . CAD Father   . Diabetes Mellitus II Neg Hx     Social History:  reports that he has quit smoking. He has never used smokeless tobacco. He reports that he does not drink alcohol or use drugs.  Allergies: No Known Allergies  Medications: I have reviewed the patient's current medications.  Results for orders placed or performed during the hospital encounter of 12/24/19 (from the past 48 hour(s))  Glucose, capillary     Status: Abnormal   Collection Time: 12/30/19  8:20 PM  Result Value Ref Range   Glucose-Capillary 248 (H) 70 - 99 mg/dL    Comment: Glucose reference range applies only to samples taken after fasting for at least 8 hours.  Glucose, capillary     Status: Abnormal   Collection Time: 12/31/19 12:45 AM  Result Value Ref Range   Glucose-Capillary 145 (H) 70 - 99 mg/dL    Comment: Glucose reference range applies only to samples taken after fasting for at least 8 hours.  CBC     Status: Abnormal   Collection Time: 12/31/19  3:54 AM  Result Value Ref Range   WBC 26.4 (H) 4.0 - 10.5 K/uL   RBC 5.15 4.22 - 5.81 MIL/uL  Hemoglobin 13.7 13.0 - 17.0 g/dL   HCT 43.1 39.0 - 52.0 %   MCV 83.7 80.0 - 100.0 fL   MCH 26.6 26.0 - 34.0 pg   MCHC 31.8 30.0 - 36.0 g/dL   RDW 14.0 11.5 - 15.5 %   Platelets 767 (H) 150 - 400 K/uL   nRBC 0.0 0.0 - 0.2 %    Comment: Performed at [facility] [address] [place], [place] [address]  Basic metabolic panel     Status: Abnormal   Collection Time: 12/31/19  3:54 AM  Result Value Ref Range    Sodium 130 (L) 135 - 145 mmol/L   Potassium 4.3 3.5 - 5.1 mmol/L   Chloride 89 (L) 98 - 111 mmol/L   CO2 31 22 - 32 mmol/L   Glucose, Bld 150 (H) 70 - 99 mg/dL    Comment: Glucose reference range applies only to samples taken after fasting for at least 8 hours.   BUN 20 6 - 20 mg/dL   Creatinine, Ser 0.67 0.61 - 1.24 mg/dL   Calcium 9.1 8.9 - 10.3 mg/dL   GFR calc non Af Amer >60 >60 mL/min   GFR calc Af Amer >60 >60 mL/min   Anion gap 10 5 - 15    Comment: Performed at [facility] [address] [place], [place] [address]  Glucose, capillary     Status: Abnormal   Collection Time: 12/31/19  8:24 AM  Result Value Ref Range   Glucose-Capillary 306 (H) 70 - 99 mg/dL    Comment: Glucose reference range applies only to samples taken after fasting for at least 8 hours.  Glucose, capillary     Status: Abnormal   Collection Time: 12/31/19 12:12 PM  Result Value Ref Range   Glucose-Capillary 263 (H) 70 - 99 mg/dL    Comment: Glucose reference range applies only to samples taken after fasting for at least 8 hours.  Glucose, capillary     Status: Abnormal   Collection Time: 12/31/19  4:50 PM  Result Value Ref Range   Glucose-Capillary 390 (H) 70 - 99 mg/dL    Comment: Glucose reference range applies only to samples taken after fasting for at least 8 hours.  Glucose, capillary     Status: Abnormal   Collection Time: 12/31/19  8:58 PM  Result Value Ref Range   Glucose-Capillary 285 (H) 70 - 99 mg/dL    Comment: Glucose reference range applies only to samples taken after fasting for at least 8 hours.  Glucose, capillary     Status: Abnormal   Collection Time: 01/01/20 12:46 AM  Result Value Ref Range   Glucose-Capillary 231 (H) 70 - 99 mg/dL    Comment: Glucose reference range applies only to samples taken after fasting for at least 8 hours.  Glucose, capillary     Status: Abnormal   Collection Time: 01/01/20  4:12 AM  Result Value Ref Range   Glucose-Capillary 163 (H) 70 - 99  mg/dL    Comment: Glucose reference range applies only to samples taken after fasting for at least 8 hours.  Glucose, capillary     Status: Abnormal   Collection Time: 01/01/20  7:57 AM  Result Value Ref Range   Glucose-Capillary 103 (H) 70 - 99 mg/dL    Comment: Glucose reference range applies only to samples taken after fasting for at least 8 hours.  Aerobic/Anaerobic Culture (surgical/deep wound)     Status: None (Preliminary result)  Collection Time: 01/01/20  9:44 AM   Specimen: Abscess  Result Value Ref Range   Specimen Description ABSCESS    Special Requests LEFT LOWER LOBE LUNG    Gram Stain      RARE WBC PRESENT,BOTH PMN AND MONONUCLEAR NO ORGANISMS SEEN Performed at [facility], [address] [place], [place] [address]    Culture PENDING    Report Status PENDING   Glucose, capillary     Status: Abnormal   Collection Time: 01/01/20 11:56 AM  Result Value Ref Range   Glucose-Capillary 189 (H) 70 - 99 mg/dL    Comment: Glucose reference range applies only to samples taken after fasting for at least 8 hours.  Glucose, capillary     Status: Abnormal   Collection Time: 01/01/20  3:32 PM  Result Value Ref Range   Glucose-Capillary 165 (H) 70 - 99 mg/dL    Comment: Glucose reference range applies only to samples taken after fasting for at least 8 hours.    CT guided needle placement  Result Date: 01/01/2020 INDICATION: Left lower lobe cavitary mass EXAM: CT-GUIDED LEFT LOWER LOBE CAVITARY MASS CORE BIOPSY MEDICATIONS: 1% lidocaine local ANESTHESIA/SEDATION: Moderate (conscious) sedation was employed during this procedure. A total of Versed 1.5 mg and Fentanyl 75 mcg was administered intravenously. Moderate Sedation Time: 13 minutes the patient's level of consciousness and vital signs were monitored continuously by radiology nursing throughout the procedure under my direct supervision. FLUOROSCOPY TIME:  Fluoroscopy Time: None. COMPLICATIONS: None immediate. PROCEDURE:  Informed written consent was obtained from the patient after a thorough discussion of the procedural risks, benefits and alternatives. All questions were addressed. Maximal Sterile Barrier Technique was utilized including caps, mask, sterile gowns, sterile gloves, sterile drape, hand hygiene and skin antiseptic. A timeout was performed prior to the initiation of the procedure. Previous imaging reviewed. Patient position prone. Noncontrast localization CT performed. The medial left lower lobe cavitary mass was localized and marked for a posterior paraspinous approach. Under sterile conditions and local anesthesia, a 17 gauge 6.8 cm guide needle was advanced to the medial cavitary lesion margin. Needle position confirmed with CT. 18 gauge core biopsies obtained and placed in saline. Sample sent for pathology and pan culture. Needle tract occluded with the bio sentry device. Postprocedure imaging demonstrates no hemorrhage or hematoma. Patient tolerated biopsy well. IMPRESSION: Successful CT-guided core biopsy of the left lower lobe cavitary mass Electronically Signed   By: Jerilynn Mages.  Shick M.D.   On: 01/01/2020 09:51    Review of Systems  Constitutional: Positive for appetite change, fatigue and unexpected weight change.  HENT: Positive for dental problem.   Eyes: Negative.   Respiratory: Positive for cough.        Hemoptysis  Gastrointestinal: Negative.   Endocrine:       Diabetes, poorly controlled  Neurological: Positive for speech difficulty, weakness and headaches.   Blood pressure (!) 102/58, pulse 73, temperature 98.3 F (36.8 C), resp. rate 19, height 5\' 11"  (1.803 m), weight 71.7 kg, SpO2 95 %. Physical Exam  Constitutional: He is oriented to person, place, and time. He appears distressed.  Anorexic, appears ill  HENT:  Head: Normocephalic.  Right Ear: External ear normal.  Left Ear: External ear normal.  Mouth/Throat: Oropharynx is clear and moist.  Eyes: Pupils are equal, round, and  reactive to light. Conjunctivae and EOM are normal.  Cardiovascular: Normal rate and regular rhythm.  Respiratory: Effort normal.  Musculoskeletal:     Cervical back: Normal range  of motion and neck supple.  Neurological: He is alert and oriented to person, place, and time. He has normal strength. No cranial nerve deficit or sensory deficit. He displays no Babinski's sign on the right side. He displays no Babinski's sign on the left side.  Reflex Scores:      Patellar reflexes are 2+ on the right side and 2+ on the left side.      Achilles reflexes are 2+ on the right side and 2+ on the left side. Mild dysmetria right >left on finger nose finger testing Mild disdiadochokinesia right side Heel shin testing mild dysmetria on right side Intact proprioception upper and lower extremities No hoffmans, no clonus Gait not assessed, romberg not tested     Assessment/Plan: Gary Hudson is a 54 y.o. 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 Odds alone predict metastatic lesion, no primary has been identified. Will arrange for operative resection when time is available.   Ashok Pall 01/01/2020, 6:08 PM

## 2020-01-01 NOTE — Progress Notes (Signed)
Chaplain spoke with Mr. Manthey sister Kieth Brightly.  Kieth Brightly stated that she will be back to the hospital tomorrow morning around 9:30 am.  Chaplain told Kieth Brightly to have nurse page chaplain when she has arrived. Chaplain will provide education around Advanced Directive.  Unit chaplain will be following up.

## 2020-01-01 NOTE — Progress Notes (Signed)
PT Cancellation Note  Patient Details Name: Gary Hudson MRN: [id number] DOB: Jun 29, 1966   Cancelled Treatment:    Reason Eval/Treat Not Completed: Fatigue/lethargy limiting ability to participate. Pt reporting significant fatigue today which is preventing him from engaging with therapies. PT will continue to follow and treat as time/schedule allows.   Karma Ganja, PT, DPT   Acute Rehabilitation Department Pager #: [phone]   Otho Bellows 01/01/2020, 4:08 PM

## 2020-01-01 NOTE — Progress Notes (Signed)
Pulmonary Note  Case reviewed, biopsy of lung lesion today by IR. Bronch biopsies neg here and at [facility]. Consider TCTS for wedge resection vs. NSGY with Brain biopsy if percutaneous sampling negative. Tough case.  Not much else to add, call if we can be of further help.  Erskine Emery MD PCCM

## 2020-01-02 ENCOUNTER — Other Ambulatory Visit: Payer: Self-pay | Admitting: Neurosurgery

## 2020-01-02 DIAGNOSIS — R519 Headache, unspecified: Secondary | ICD-10-CM

## 2020-01-02 DIAGNOSIS — C7931 Secondary malignant neoplasm of brain: Principal | ICD-10-CM

## 2020-01-02 DIAGNOSIS — R42 Dizziness and giddiness: Secondary | ICD-10-CM

## 2020-01-02 LAB — GLUCOSE, CAPILLARY
Glucose-Capillary: 171 mg/dL — ABNORMAL HIGH (ref 70–99)
Glucose-Capillary: 176 mg/dL — ABNORMAL HIGH (ref 70–99)
Glucose-Capillary: 249 mg/dL — ABNORMAL HIGH (ref 70–99)
Glucose-Capillary: 287 mg/dL — ABNORMAL HIGH (ref 70–99)
Glucose-Capillary: 330 mg/dL — ABNORMAL HIGH (ref 70–99)
Glucose-Capillary: 355 mg/dL — ABNORMAL HIGH (ref 70–99)

## 2020-01-02 LAB — CBC
HCT: 38.7 % — ABNORMAL LOW (ref 39.0–52.0)
Hemoglobin: 12.3 g/dL — ABNORMAL LOW (ref 13.0–17.0)
MCH: 26.7 pg (ref 26.0–34.0)
MCHC: 31.8 g/dL (ref 30.0–36.0)
MCV: 84.1 fL (ref 80.0–100.0)
Platelets: 633 10*3/uL — ABNORMAL HIGH (ref 150–400)
RBC: 4.6 MIL/uL (ref 4.22–5.81)
RDW: 14.4 % (ref 11.5–15.5)
WBC: 24.3 10*3/uL — ABNORMAL HIGH (ref 4.0–10.5)
nRBC: 0 % (ref 0.0–0.2)

## 2020-01-02 LAB — BASIC METABOLIC PANEL
Anion gap: 11 (ref 5–15)
BUN: 17 mg/dL (ref 6–20)
CO2: 33 mmol/L — ABNORMAL HIGH (ref 22–32)
Calcium: 9.2 mg/dL (ref 8.9–10.3)
Chloride: 87 mmol/L — ABNORMAL LOW (ref 98–111)
Creatinine, Ser: 0.65 mg/dL (ref 0.61–1.24)
GFR calc Af Amer: >60 mL/min (ref >60)
GFR calc non Af Amer: >60 mL/min (ref >60)
Glucose, Bld: 168 mg/dL — ABNORMAL HIGH (ref 70–99)
Potassium: 4.5 mmol/L (ref 3.5–5.1)
Sodium: 131 mmol/L — ABNORMAL LOW (ref 135–145)

## 2020-01-02 MED ORDER — PRO-STAT SUGAR FREE PO LIQD
30.0000 mL | Freq: Two times a day (BID) | ORAL | Status: DC
  Administered 2020-01-02 – 2020-01-10 (×4): 30 mL via ORAL
  Filled 2020-01-02 (×18): qty 30

## 2020-01-02 NOTE — Progress Notes (Signed)
Hopefully, Mr. Cabello will have neurosurgery to remove the cerebellar met.  I know he was seen by Dr. Christella Noa yesterday.  I know that Mr. Gallardo is in fantastic hands with Dr. Christella Noa.  Hopefully, surgery will be done in a day or so.  Mr. Garringer did have a CT-guided biopsy yesterday of a lung lesion.  There is also not back yet.  He still is having the problem with headaches and dizziness.  I am sure this is from this cerebellar met.  His labs look okay.  His white cell count is elevated because of the steroids that he is on.  His hemoglobin is 12.3.  Platelet count 633,000.  His blood sugar is 168.  Again, we do not have a diagnosis of cancer as of yet.  One would have to think that this is malignant although this would be a usual presentation given all these cavitary lesions.  We will have to wait biopsy results.  Lattie Haw, MD  Lurena [place] 1:37

## 2020-01-02 NOTE — Progress Notes (Signed)
Nutrition Follow-up  DOCUMENTATION CODES:   Severe malnutrition in context of chronic illness  INTERVENTION:   - Continue Ensure Enlive po TID, each supplement provides 350 kcal and 20 grams of protein  - Continue double protein portions TID with meals  - Pro-stat 30 ml po BID, each supplement provides 100 kcal and 15 grams of protein  - MVI with minerals daily  - Encourage adequate PO intake  NUTRITION DIAGNOSIS:   Severe Malnutrition related to chronic illness (cavitary lung lesions, cerebellar mass) as evidenced by severe fat depletion, severe muscle depletion, percent weight loss (17.6% weight loss in 10 months).  New diagnosis after completion of NFPE  GOAL:   Patient will meet greater than or equal to 90% of their needs   Progressing  MONITOR:   PO intake, Supplement acceptance, Labs, Weight trends, Skin, I & O's  REASON FOR ASSESSMENT:   Malnutrition Screening Tool    ASSESSMENT:   54 year old male who presented on 5/02 with headache, decreased PO intake, AMS. PMH of loculated left empyema with cavitary lung lesion in the LLL s/p VATS in 01/2019, tobacco use, T2DM, HTN, hypothyroidism, recent hospitalization for cavitary lung lesions and a cerebellar mass of unknown etiology.  5/04 - s/p bronchoscopy 5/05 - s/p MBS with recommendations for Regular diet, thin liquids 5/10 - s/p CT guided core biopsy of lung mass  Neurosurgery with plans for operative resection of right cerebellar cystic mass.  Spoke with pt and family members at bedside. Pt reports appetite has improved since admission but that the smell of some foods causes him to feel nauseated. RD changed pt to "with assist" so that he will be able to order meals of his choosing rather than being sent house trays. Pt states that he does not like the Ensure Enlive very much but is willing to drink 3 of the chocolate ones daily if that is what is needed.  RD discussed the importance of adequate kcal and  protein intake in healing and in preparation for surgery. Pt and family expressed understanding. Discussed ways to optimize nutrition after discharge.  Pt with a 14.7 kg weight loss over the last 10 months. This is a 17.6% weight loss which is significant for timeframe. Pt meets criteria for malnutrition.  Pt requesting pain medication. RN aware.  Meal Completion: 95-100%  Medications reviewed and include: decadron, Ensure Enlive TID, SSI q 4 hours, Lantus 21 units daily, MVI with minerals, miralax, senna  Labs reviewed: sodium 131, chloride 87 CBG's: 162-287 x 24 hours  NUTRITION - FOCUSED PHYSICAL EXAM:    Most Recent Value  Orbital Region  Severe depletion  Upper Arm Region  Moderate depletion  Thoracic and Lumbar Region  Severe depletion  Buccal Region  Moderate depletion  Temple Region  Moderate depletion  Clavicle Bone Region  Severe depletion  Clavicle and Acromion Bone Region  Severe depletion  Scapular Bone Region  Moderate depletion  Dorsal Hand  Moderate depletion  Patellar Region  Moderate depletion  Anterior Thigh Region  Moderate depletion  Posterior Calf Region  Moderate depletion  Edema (RD Assessment)  None  Hair  Reviewed  Eyes  Reviewed  Mouth  Reviewed  Skin  Reviewed  Nails  Reviewed       Diet Order:   Diet Order            Diet Carb Modified Fluid consistency: Thin; Room service appropriate? Yes  Diet effective now  EDUCATION NEEDS:   Education needs have been addressed  Skin:  Skin Assessment: Skin Integrity Issues: Incisions: back  Last BM:  no documented BM  Height:   Ht Readings from Last 1 Encounters:  12/24/19 5\' 11"  (1.803 m)    Weight:   Wt Readings from Last 1 Encounters:  01/02/20 68.9 kg    Ideal Body Weight:  78.2 kg  BMI:  Body mass index is 21.19 kg/m.  Estimated Nutritional Needs:   Kcal:  6269-4854  Protein:  105-125 grams  Fluid:  >/= 2.0 L    Gaynell Face, MS, RD,  LDN Inpatient Clinical Dietitian Pager: [phone] Weekend/After Hours: [phone]

## 2020-01-02 NOTE — Progress Notes (Signed)
  Speech Language Pathology Treatment: Dysphagia  Patient Details Name: Gary Hudson MRN: [id number] DOB: 1966/05/13 Today's Date: 01/02/2020 Time: 4098-1191 SLP Time Calculation (min) (ACUTE ONLY): 10 min  Assessment / Plan / Recommendation Clinical Impression  Pt encountered sitting up in bed, smiled at therapist and denied headache presently and reported his surgery is Friday. He recalled and demonstrated chin tuck however needed cues for more effective implementation. Pt reached for table to place cup on tray simultaneously with swallowing preventing full cervical ROM. There were no s/s aspiration with thin liquid via straw. He declined solids at this time. Will continue treatment and observing with meal would be optimal.   HPI HPI: Gary Hudson is a 54 year old M with significant PMH of loculated left empyema with cavitary lung lesion in the left lower lobe s/p VATS in 01/2019, significant tobacco use, type II diabetes, hypertension, and hypothyroidism. He presents to the ED today for intractable headaches. Of note, he had a recent hospitalization 1 month ago after presenting for hemoptysis and weight loss and was found to have enlargement of his prior cavitary lung lesions with numerous bilateral smaller lesions and right cerebellar cystic mass.      SLP Plan  Continue with current plan of care       Recommendations  Diet recommendations: Regular;Thin liquid Liquids provided via: Cup;Straw Medication Administration: Whole meds with puree Supervision: Patient able to self feed;Intermittent supervision to cue for compensatory strategies Compensations: Small sips/bites;Chin tuck;Clear throat after each swallow Postural Changes and/or Swallow Maneuvers: Seated upright 90 degrees                Oral Care Recommendations: Oral care BID Follow up Recommendations: Other (comment)(TBD) SLP Visit Diagnosis: Dysphagia, pharyngeal phase (R13.13) Plan: Continue with current plan of  care       Gary Hudson 01/02/2020, 4:30 PM

## 2020-01-02 NOTE — Progress Notes (Signed)
Chaplain attempted notary of AD but no volunteers available. Chaplain will attempt notary again Wednesday morning.  De Burrs Chaplain Resident

## 2020-01-02 NOTE — Progress Notes (Signed)
Patient ID: Gary Hudson, male   DOB: Sep 25, 1965, 54 y.o.   MRN: [id number] BP 113/62 (BP Location: Left Arm)   Pulse 77   Temp (!) 97.4 F (36.3 C)   Resp 20   Ht 5\' 11"  (1.803 m)   Wt 68.9 kg   SpO2 94%   BMI 21.19 kg/m  Alert and oriented x 4, speech is clear, fluent Perrl, full eom Moving all extremities Symmetric facies, tongue and uvula midline I have scheduled Gary Hudson for Friday afternoon. Consent obtained from sister. No new recommendations.

## 2020-01-02 NOTE — Progress Notes (Signed)
PT Cancellation Note  Patient Details Name: Gary Hudson MRN: [id number] DOB: 10-09-1965   Cancelled Treatment:    Reason Eval/Treat Not Completed: Other (comment) Pt adamantly declined PT upon arrival.  Stated "I'm good, I got up with the other girl, walked in room, and took a shower, no more therapy today."  Pt had worked with OT earlier. Will f/u as able. Maggie Font, PT Acute Rehab Services Pager [phone] [facility] [phone]   Karlton Lemon 01/02/2020, 1:03 PM

## 2020-01-02 NOTE — Progress Notes (Signed)
Subjective:  O/N Events:None   Gary Hudson evaluated at bedside today. He states that he spoke with Neurosurgery yesterday, and that there is a plan for surgery for his brain mass. He states that his headaches are about the same as yesterday. We discussed ambulation today. He states he is able to ambulate to the bathroom and back, but has to catch his breath. He states that his ambulation has gotten worse since his hospitalization. He denies nausea, vomiting, or fevers overnight. He has no complaints at this time. All questions and concerns were addressed.   Objective:  Vital signs in last 24 hours: Vitals:   01/01/20 1158 01/01/20 1533 01/01/20 2332 01/02/20 0430  BP: 125/85 (!) 102/58 116/74   Pulse: 84 73 69   Resp: 18 19    Temp: 97.9 F (36.6 C) 98.3 F (36.8 C) 97.7 F (36.5 C)   TempSrc: Oral  Oral   SpO2: 99% 95% 94%   Weight:    68.9 kg  Height:       Physical Exam Constitutional:      General: He is not in acute distress.    Appearance: He is normal weight.  HENT:     Head: Normocephalic.  Cardiovascular:     Rate and Rhythm: Normal rate and regular rhythm.  Pulmonary:     Effort: Pulmonary effort is normal. No accessory muscle usage or respiratory distress.  Abdominal:     General: Bowel sounds are normal.     Palpations: Abdomen is soft. There is no mass.     Tenderness: There is no abdominal tenderness. There is no guarding.  Musculoskeletal:     Right lower leg: No tenderness. No edema.     Left lower leg: No tenderness. No edema.  Skin:    General: Skin is warm and dry.  Neurological:     Mental Status: He is alert and oriented to person, place, and time.      CBG (last 3)  Recent Labs    01/01/20 1949 01/01/20 2333 01/02/20 0306  GLUCAP 183* 162* 171*    CBC Latest Ref Rng & Units 01/02/2020 12/31/2019 12/30/2019  WBC 4.0 - 10.5 K/uL 24.3(H) 26.4(H) 22.3(H)  Hemoglobin 13.0 - 17.0 g/dL 12.3(L) 13.7 13.8  Hematocrit 39.0 - 52.0 % 38.7(L) 43.1  43.0  Platelets 150 - 400 K/uL 633(H) 767(H) 871(H)   BMP Latest Ref Rng & Units 01/02/2020 12/31/2019 12/30/2019  Glucose 70 - 99 mg/dL 168(H) 150(H) 159(H)  BUN 6 - 20 mg/dL 17 20 21(H)  Creatinine 0.61 - 1.24 mg/dL 0.65 0.67 0.71  Sodium 135 - 145 mmol/L 131(L) 130(L) 131(L)  Potassium 3.5 - 5.1 mmol/L 4.5 4.3 5.0  Chloride 98 - 111 mmol/L 87(L) 89(L) 89(L)  CO2 22 - 32 mmol/L 33(H) 31 28  Calcium 8.9 - 10.3 mg/dL 9.2 9.1 9.6   Assessment/Plan:  Active Problems:   Uncontrolled type 2 diabetes mellitus with hyperglycemia (HCC)   Essential hypertension   Status post thoracotomy   Poor dentition   Cavitary lesion of lung   History of pleural effusion   Dyslipidemia   Cerebellar mass   Hypothyroid   Normocytic anemia   Unintentional weight loss   Hemoptysis   Dysphagia  Persistent Headaches Right Cerebellar Cystic Mass: Patient admitted with persistent headaches are most likely related to mass effect from cerebellar mass. Symptoms have been difficult to control thus far. We obtained an MRI on 12/29/19 to further characterize the mass which revealed a mass  centered in right cerebellar hemisphere measuring 4.5x3.9x3.2 cm with layering debris and thin rim enhancement. - Neurosurgery consulted, we appreciate their recommendations.  - Plans for operative resection.   - Continue Decadron 4mg  Q6H.  - Continue 1mg  Q2H  Cavitary Lung Disease Infection vs Malignancy:  Patient underwent LLL biopsy 01/01/20. Labs are currently pending.    - Surgical Pathology: Active - Aerobic/Anaerobic: Active - Fungus Culture: Active - Acid Fast Smear: Active - Acid Fast Culture: Active - Gram Stain: Rare WBC (PMN and mononuclear). No organisms seen.   Type II Diabetes - Uncontrolled A1c 13.5 on admission. Pt on glimepiride 4mg  daily and metformin 1000mg  BID at home.  - SSI Resistant. - Lantus 21U  Hypertension: - Losartan 50mg  QD - HCZ 12.5mg  QD  Hypothyroidism - Levothyroxine 39mcg  QD  Prior to Admission Living Arrangement: home Anticipated Discharge Location: home Barriers to Discharge: continued medical work-up  Dispo: Anticipated discharge in approximately 3-4 day(s).   Maudie Mercury, MD 01/02/2020, 5:55 AM Pager: [phone]

## 2020-01-02 NOTE — Progress Notes (Signed)
Occupational Therapy Treatment Patient Details Name: Gary Hudson MRN: [id number] DOB: Feb 14, 1966 Today's Date: 01/02/2020    History of present illness This is a 54 year old gentleman with a history of tobacco use disorder and previous empyema of the left lower lobe status post VATS decortication in June 2020.  He presents with several months of hemoptysis, headache and unintentional weight loss.  Was recently admitted to [facility] on 4/6 with these complaints and had prolonged hospital stay.  Tuberculosis was ruled out.  He had an EBUS which was negative for malignancy.  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  He was supposed to have a CT-guided biopsy but checked himself out AMA.  He presented yesterday to the [facility], ED with worsening shortness of breath and headache.    OT comments  Pt making progress. Pt able to complete all basic adls in his room with no hands on assist.  Pt is very impulsive but feel this may be more his personality as well so he is a safety risk.  Spoke to pt about slowing down and taking his time with adls when on his feet.  Will continue to see to address home skills.   Follow Up Recommendations  No OT follow up;Supervision - Intermittent    Equipment Recommendations  3 in 1 bedside commode    Recommendations for Other Services      Precautions / Restrictions Precautions Precautions: None Restrictions Weight Bearing Restrictions: No       Mobility Bed Mobility Overal bed mobility: Independent                Transfers Overall transfer level: Modified independent Equipment used: None Transfers: Sit to/from Stand Sit to Stand: Supervision         General transfer comment: To ensure balance and safety    Balance Overall balance assessment: Mild deficits observed, not formally tested                                         ADL either performed or assessed with clinical judgement   ADL Overall  ADL's : Needs assistance/impaired Eating/Feeding: Independent;Sitting   Grooming: Wash/dry hands;Wash/dry face;Oral care;Applying deodorant;Brushing hair;Supervision/safety;Standing Grooming Details (indicate cue type and reason): stood at sink for 5 minutes to groom with no hands on assist. Pt leans against the sink to assist with balance. Pt moves quickly and not always safe with his movements. Upper Body Bathing: Supervision/ safety;Standing Upper Body Bathing Details (indicate cue type and reason): in shower Lower Body Bathing: Supervison/ safety;Cueing for safety;Sit to/from stand Lower Body Bathing Details (indicate cue type and reason): PT stood in shower Upper Body Dressing : Set up;Sitting   Lower Body Dressing: Supervision/safety;Sit to/from stand;Cueing for safety Lower Body Dressing Details (indicate cue type and reason): Pt fully dressed LE from EOB.  Supervision only when standing due to impulsivity. Toilet Transfer: Supervision/safety;Ambulation;Regular Toilet;Grab bars Toilet Transfer Details (indicate cue type and reason): Pt walked to bathroom with no assistive device for all toileting tasks. Toileting- Clothing Manipulation and Hygiene: Supervision/safety;Sit to/from stand   Tub/ Shower Transfer: Supervision/safety;Shower seat;Walk-in shower;Grab Paediatric nurse Details (indicate cue type and reason): Pt showered with 3:1 available if needed. Functional mobility during ADLs: Supervision/safety General ADL Comments: Pt up and ambulating in room for all adls. Only one LOB walking back to bed but pt recovered without assist. Main concern  is with impulsivity.     Vision   Vision Assessment?: No apparent visual deficits   Perception     Praxis      Cognition Arousal/Alertness: Awake/alert Behavior During Therapy: Impulsive Overall Cognitive Status: No family/caregiver present to determine baseline cognitive functioning                                  General Comments: Pt very impulsive and fast to move.  Otherwise appears intact.  This may be more his personality.        Exercises     Shoulder Instructions       General Comments Pt most limited by impulsivity and hurring through all tasks.    Pertinent Vitals/ Pain       Pain Assessment: Faces Faces Pain Scale: Hurts whole lot Pain Location: head Pain Descriptors / Indicators: Headache Pain Intervention(s): Monitored during session;Repositioned  Home Living                                          Prior Functioning/Environment              Frequency  Min 2X/week        Progress Toward Goals  OT Goals(current goals can now be found in the care plan section)  Progress towards OT goals: Progressing toward goals  Acute Rehab OT Goals Patient Stated Goal: to go home Time For Goal Achievement: 01/09/20 Potential to Achieve Goals: Good ADL Goals Pt Will Perform Tub/Shower Transfer: Tub transfer;with modified independence Additional ADL Goal #1: Pt to complete all ADLs independently with 0 instances of LOB. Additional ADL Goal #2: Pt to complete higher level IADLs (i.e. bed making, item retrieval) independently. Additional ADL Goal #3: Pt to tolerate standing up to 10 min independently, in preparation for ADLs.  Plan Discharge plan remains appropriate    Co-evaluation                 AM-PAC OT "6 Clicks" Daily Activity     Outcome Measure   Help from another person eating meals?: None Help from another person taking care of personal grooming?: None Help from another person toileting, which includes using toliet, bedpan, or urinal?: A Little Help from another person bathing (including washing, rinsing, drying)?: A Little Help from another person to put on and taking off regular upper body clothing?: None Help from another person to put on and taking off regular lower body clothing?: None 6 Click Score: 22    End of Session     OT Visit Diagnosis: Unsteadiness on feet (R26.81);Muscle weakness (generalized) (M62.81);Pain   Activity Tolerance Patient tolerated treatment well   Patient Left in bed;with call bell/phone within reach   Nurse Communication Mobility status        Time: 6294-7654 OT Time Calculation (min): 26 min  Charges: OT General Charges $OT Visit: 1 Visit OT Treatments $Self Care/Home Management : 23-37 mins    Glenford Peers 01/02/2020, 11:17 AM

## 2020-01-03 DIAGNOSIS — E43 Unspecified severe protein-calorie malnutrition: Secondary | ICD-10-CM | POA: Insufficient documentation

## 2020-01-03 LAB — GLUCOSE, CAPILLARY
Glucose-Capillary: 292 mg/dL — ABNORMAL HIGH (ref 70–99)
Glucose-Capillary: 289 mg/dL — ABNORMAL HIGH (ref 70–99)
Glucose-Capillary: 397 mg/dL — ABNORMAL HIGH (ref 70–99)
Glucose-Capillary: 356 mg/dL — ABNORMAL HIGH (ref 70–99)
Glucose-Capillary: 366 mg/dL — ABNORMAL HIGH (ref 70–99)

## 2020-01-03 MED ORDER — INSULIN ASPART 100 UNIT/ML ~~LOC~~ SOLN
0.0000 [IU] | Freq: Three times a day (TID) | SUBCUTANEOUS | Status: DC
  Administered 2020-01-04: 20 [IU] via SUBCUTANEOUS
  Administered 2020-01-04: 15 [IU] via SUBCUTANEOUS
  Administered 2020-01-04: 20 [IU] via SUBCUTANEOUS

## 2020-01-03 MED ORDER — INSULIN ASPART 100 UNIT/ML ~~LOC~~ SOLN
6.0000 [IU] | Freq: Three times a day (TID) | SUBCUTANEOUS | Status: DC
  Administered 2020-01-03 – 2020-01-04 (×4): 6 [IU] via SUBCUTANEOUS

## 2020-01-03 NOTE — Progress Notes (Signed)
OT Cancellation Note  Patient Details Name: Gary Hudson MRN: [id number] DOB: 03/07/1966   Cancelled Treatment:    Reason Eval/Treat Not Completed: Patient declined, no reason specified. Pt reports that he cannot work with therapy because he feels dizzy while seated in bed and that he will walk with family members later today.   Emmit Alexanders [facility] 01/03/2020, 12:03 PM

## 2020-01-03 NOTE — Progress Notes (Signed)
Inpatient Diabetes Program Recommendations  AACE/ADA: New Consensus Statement on Inpatient Glycemic Control   Target Ranges:  Prepandial:   less than 140 mg/dL      Peak postprandial:   less than 180 mg/dL (1-2 hours)      Critically ill patients:  140 - 180 mg/dL   Results for EVERARDO, VORIS (MRN [id number]) as of 01/03/2020 10:26  Ref. Range 01/02/2020 08:18 01/02/2020 11:52 01/02/2020 15:54 01/02/2020 19:55 01/02/2020 23:45 01/03/2020 03:37 01/03/2020 08:20  Glucose-Capillary Latest Ref Range: 70 - 99 mg/dL 176 (H) 287 (H) 355 (H) 330 (H) 249 (H) 292 (H) 289 (H)   Review of Glycemic Control  Diabetes history: DM2 Outpatient Diabetes medications: Lantus 30 units daily, Amaryl 4 mg daily, Metformin 1000 mg QAM, Metformin 500 mg QPM Current orders for Inpatient glycemic control: Lantus 21 units daily, Novolog 0-20 units Q4H; Decadron 4 mg Q6H  Inpatient Diabetes Program Recommendations:   Insulin-Basal: If steroids are continued, please consider increasing Lantus to 30 units daily.  Insulin-Meal Coverage: If steroids are continued, please consider ordering Novolog 6 units TID with meals for meal coverage if patient eats at least 50% of meals.  Thanks, Barnie Alderman, RN, MSN, CDE Diabetes Coordinator Inpatient Diabetes Program [phone] (Team Pager from 8am to 5pm)

## 2020-01-03 NOTE — Progress Notes (Signed)
Chaplain responded to request for notary of AD at the time sister was present.  Notary accomplished.  De Burrs Chaplain Resident

## 2020-01-03 NOTE — Progress Notes (Signed)
Subjective:  O/N Events:None   Mr. Eisemann was seen and evaluated at bedside on morning rounds. Headache is unchanged, manages ok with pain medication. No vision changes, chest pain or difficulty breathing. He is having normal BMs. Neurosurgery plans for resection on Friday or earlier if schedule permits.   Objective:  Vital signs in last 24 hours: Vitals:   01/02/20 0430 01/02/20 0821 01/02/20 1553 01/02/20 2344  BP:  100/72 113/62 107/67  Pulse:  63 77 66  Resp:  20  19  Temp:  98.2 F (36.8 C) (!) 97.4 F (36.3 C) (!) 97.4 F (36.3 C)  TempSrc:      SpO2:  96% 94% 97%  Weight: 68.9 kg     Height:        Physical Exam Constitutional:      Appearance: He is ill-appearing.  Neurological:     Mental Status: He is alert.     CBG (last 3)  Recent Labs    01/02/20 1955 01/02/20 2345 01/03/20 0337  GLUCAP 330* 249* 292*    CBC Latest Ref Rng & Units 01/02/2020 12/31/2019 12/30/2019  WBC 4.0 - 10.5 K/uL 24.3(H) 26.4(H) 22.3(H)  Hemoglobin 13.0 - 17.0 g/dL 12.3(L) 13.7 13.8  Hematocrit 39.0 - 52.0 % 38.7(L) 43.1 43.0  Platelets 150 - 400 K/uL 633(H) 767(H) 871(H)   BMP Latest Ref Rng & Units 01/02/2020 12/31/2019 12/30/2019  Glucose 70 - 99 mg/dL 168(H) 150(H) 159(H)  BUN 6 - 20 mg/dL 17 20 21(H)  Creatinine 0.61 - 1.24 mg/dL 0.65 0.67 0.71  Sodium 135 - 145 mmol/L 131(L) 130(L) 131(L)  Potassium 3.5 - 5.1 mmol/L 4.5 4.3 5.0  Chloride 98 - 111 mmol/L 87(L) 89(L) 89(L)  CO2 22 - 32 mmol/L 33(H) 31 28  Calcium 8.9 - 10.3 mg/dL 9.2 9.1 9.6   Assessment/Plan:  Active Problems:   Uncontrolled type 2 diabetes mellitus with hyperglycemia (HCC)   Essential hypertension   Status post thoracotomy   Poor dentition   Cavitary lesion of lung   History of pleural effusion   Dyslipidemia   Cerebellar mass   Hypothyroid   Normocytic anemia   Unintentional weight loss   Hemoptysis   Dysphagia  Persistent Headaches Right Cerebellar Cystic Mass: Patient admitted with  persistent headaches are most likely related to mass effect from cerebellar mass. Symptoms have been difficult to control thus far. We obtained an MRI on 12/29/19 to further characterize the mass which revealed a mass centered in right cerebellar hemisphere measuring 4.5x3.9x3.2 cm with layering debris and thin rim enhancement. - Neurosurgery consulted, we appreciate their recommendations.  - Scheduled R suboccipital craniotomy for tumor resection on 01/05/20 - Continue Decadron 4mg  Q6H.  - Continue 1mg  Q2H  Cavitary Lung Disease Infection vs Malignancy:  Patient underwent LLL biopsy 01/01/20. Labs are currently pending.    - Surgical Pathology: Active - Aerobic/Anaerobic: No growth 24 hours - Fungus Culture: Process - Acid Fast Smear: Process - Acid Fast Culture: Process - Gram Stain: Rare WBC (PMN and mononuclear). No organisms seen.   Type II Diabetes - Uncontrolled A1c 13.5 on admission. Pt on glimepiride 4mg  daily and metformin 1000mg  BID at home.  - SSI Resistant. - Lantus 21U - Novolog 6 U with meals.   Hypertension: - Losartan 50mg  QD - HCZ 12.5mg  QD  Hypothyroidism - Levothyroxine 62mcg QD  Prior to Admission Living Arrangement: home Anticipated Discharge Location: home Barriers to Discharge: continued medical work-up  Dispo: Anticipated discharge in approximately 3-4 day(s).  Maudie Mercury, MD 01/03/2020, 7:16 AM Pager: [phone]

## 2020-01-03 NOTE — Progress Notes (Signed)
PT Cancellation Note  Patient Details Name: Gary Hudson MRN: [id number] DOB: Jul 22, 1966   Cancelled Treatment:    Reason Eval/Treat Not Completed: Patient declined, no reason specified.  I'm unable to return today. 01/03/2020  Ginger Carne., PT Acute Rehabilitation Services [phone]  (pager) [phone]  (office)   Tessie Fass Kentavious Michele 01/03/2020, 3:43 PM

## 2020-01-04 DIAGNOSIS — R634 Abnormal weight loss: Secondary | ICD-10-CM

## 2020-01-04 LAB — GLUCOSE, CAPILLARY
Glucose-Capillary: 302 mg/dL — ABNORMAL HIGH (ref 70–99)
Glucose-Capillary: 318 mg/dL — ABNORMAL HIGH (ref 70–99)
Glucose-Capillary: 395 mg/dL — ABNORMAL HIGH (ref 70–99)
Glucose-Capillary: 462 mg/dL — ABNORMAL HIGH (ref 70–99)
Glucose-Capillary: 397 mg/dL — ABNORMAL HIGH (ref 70–99)

## 2020-01-04 LAB — ACID FAST SMEAR (AFB, MYCOBACTERIA): Acid Fast Smear: NEGATIVE

## 2020-01-04 LAB — SURGICAL PATHOLOGY

## 2020-01-04 MED ORDER — INSULIN ASPART 100 UNIT/ML ~~LOC~~ SOLN
0.0000 [IU] | Freq: Three times a day (TID) | SUBCUTANEOUS | Status: DC
  Administered 2020-01-05: 11 [IU] via SUBCUTANEOUS

## 2020-01-04 MED ORDER — INSULIN GLARGINE 100 UNIT/ML ~~LOC~~ SOLN
25.0000 [IU] | Freq: Every day | SUBCUTANEOUS | Status: DC
  Administered 2020-01-04: 25 [IU] via SUBCUTANEOUS
  Filled 2020-01-04: qty 0.25

## 2020-01-04 MED ORDER — INSULIN GLARGINE 100 UNIT/ML ~~LOC~~ SOLN
35.0000 [IU] | Freq: Every day | SUBCUTANEOUS | Status: DC
  Administered 2020-01-05: 35 [IU] via SUBCUTANEOUS
  Filled 2020-01-04: qty 0.35

## 2020-01-04 MED ORDER — INSULIN GLARGINE 100 UNIT/ML ~~LOC~~ SOLN
10.0000 [IU] | Freq: Once | SUBCUTANEOUS | Status: AC
  Administered 2020-01-04: 10 [IU] via SUBCUTANEOUS
  Filled 2020-01-04: qty 0.1

## 2020-01-04 MED ORDER — INSULIN ASPART 100 UNIT/ML ~~LOC~~ SOLN
10.0000 [IU] | Freq: Three times a day (TID) | SUBCUTANEOUS | Status: DC
  Administered 2020-01-04 – 2020-01-05 (×2): 10 [IU] via SUBCUTANEOUS

## 2020-01-04 MED ORDER — INSULIN ASPART 100 UNIT/ML ~~LOC~~ SOLN
0.0000 [IU] | Freq: Every day | SUBCUTANEOUS | Status: DC
  Administered 2020-01-04: 5 [IU] via SUBCUTANEOUS

## 2020-01-04 MED ORDER — INSULIN ASPART 100 UNIT/ML ~~LOC~~ SOLN
5.0000 [IU] | Freq: Once | SUBCUTANEOUS | Status: AC
  Administered 2020-01-04: 5 [IU] via SUBCUTANEOUS

## 2020-01-04 NOTE — Progress Notes (Signed)
Patient's CBG 462. Paged physician at number in notes x 2. Waiting on call back with orders.

## 2020-01-04 NOTE — Progress Notes (Signed)
Patient ID: Gary Hudson, male   DOB: [birth year], 54 y.o.   MRN: [id number] BP 129/73 (BP Location: Left Arm)   Pulse 69   Temp 97.8 F (36.6 C) (Oral)   Resp 20   Ht 5\' 11"  (1.803 m)   Wt 70.5 kg   SpO2 97%   BMI 21.68 kg/m  Alert and oriented x 4, speech is clear and fluent Moving all extremities well perrl full eom Mild problems with coordination.  OR tomorrow. Despite identification of primary bronchogenic ca, tumor needs to be removed. It however not likely to be a great deal of solid tumor for genetic studies. It is cystic.

## 2020-01-04 NOTE — Progress Notes (Signed)
Physical Therapy Treatment Patient Details Name: Gary Hudson MRN: [id number] DOB: 07/06/1966 Today's Date: 01/04/2020    History of Present Illness This is a 54 year old gentleman with a history of tobacco use disorder and previous empyema of the left lower lobe status post VATS decortication in June 2020.  He presents with several months of hemoptysis, headache and unintentional weight loss.  Was recently admitted to [facility] on 4/6 with these complaints and had prolonged hospital stay.  Tuberculosis was ruled out.  He had an EBUS which was negative for malignancy.  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  He was supposed to have a CT-guided biopsy but checked himself out AMA.  He presented yesterday to the [facility], ED with worsening shortness of breath and headache.  Pt scheduled for mass removal on 01/05/20.    PT Comments    Pt agreeable to walk with PT but further activity limited due to pt with headache.  Pt was able to increase gait distance today but required rest break and min guard for safety.  Pt with mild unsteadiness but no overt LOB with gait.  Pt is scheduled for cerebellar mass resection tomorrow - will need new therapy order or reeval when appropriate.      Follow Up Recommendations  No PT follow up(None at this time - will need further assessment post op)     Equipment Recommendations  Cane;Other (comment)(will need further assessment post op)    Recommendations for Other Services       Precautions / Restrictions Precautions Precautions: None Restrictions Weight Bearing Restrictions: No    Mobility  Bed Mobility Overal bed mobility: Independent                Transfers Overall transfer level: Needs assistance Equipment used: None Transfers: Sit to/from Stand Sit to Stand: Supervision         General transfer comment: To ensure balance and safety  Ambulation/Gait Ambulation/Gait assistance: Min guard Gait Distance  (Feet): 100 Feet(x2) Assistive device: 1 person hand held assist(and handrail at times) Gait Pattern/deviations: Step-through pattern;Drifts right/left Gait velocity: decreased   General Gait Details: denied use of RW; required 1 seated rest break; cued to pick a focus point with gait and transfers to assist with dizziness; unsteady and drifting R/L but no overt LOB   Stairs             Wheelchair Mobility    Modified Rankin (Stroke Patients Only)       Balance Overall balance assessment: Needs assistance   Sitting balance-Leahy Scale: Normal       Standing balance-Leahy Scale: Good                              Cognition Arousal/Alertness: Awake/alert Behavior During Therapy: WFL for tasks assessed/performed Overall Cognitive Status: Within Functional Limits for tasks assessed                                        Exercises      General Comments General comments (skin integrity, edema, etc.): VSS   Pt reluctantly agreed to therapy today but then reported it felt good to walk in hall.       Pertinent Vitals/Pain Pain Assessment: Faces Faces Pain Scale: Hurts a little bit Pain Location: head Pain Descriptors / Indicators:  Headache Pain Intervention(s): Monitored during session;Repositioned;Relaxation    Home Living                      Prior Function            PT Goals (current goals can now be found in the care plan section) Progress towards PT goals: Progressing toward goals    Frequency    Min 3X/week      PT Plan Current plan remains appropriate    Co-evaluation              AM-PAC PT "6 Clicks" Mobility   Outcome Measure  Help needed turning from your back to your side while in a flat bed without using bedrails?: None Help needed moving from lying on your back to sitting on the side of a flat bed without using bedrails?: None Help needed moving to and from a bed to a chair (including a  wheelchair)?: None Help needed standing up from a chair using your arms (e.g., wheelchair or bedside chair)?: None Help needed to walk in hospital room?: A Little Help needed climbing 3-5 steps with a railing? : A Little 6 Click Score: 22    End of Session   Activity Tolerance: Patient tolerated treatment well Patient left: in bed;with call bell/phone within reach(pt has been getting up to bathroom independently)   PT Visit Diagnosis: Other abnormalities of gait and mobility (R26.89)     Time: 1423-9532 PT Time Calculation (min) (ACUTE ONLY): 10 min  Charges:  $Gait Training: 8-22 mins                     Maggie Font, PT Acute Rehab Services Pager [phone] [facility] [phone] [facility] [place] 01/04/2020, 11:00 AM

## 2020-01-04 NOTE — Progress Notes (Signed)
Subjective:  O/N Events:None   Gary Hudson was seen at bedside this morning. He states that he is sleepy, but doing the same as the last several days. We discussed the importance of participating with PT and OT, as they can help make recommendations for him as well as keep him conditioned here in the hospital. He voiced understanding, but states that he does get up and move around his room and sits in his chair. He is looking forward to his procedure tomorrow.   Objective:  Vital signs in last 24 hours: Vitals:   01/03/20 1600 01/03/20 2139 01/04/20 0557 01/04/20 0742  BP: 118/67 136/69  119/79  Pulse: 65 62  67  Resp: 16 17  16   Temp: (!) 97.4 F (36.3 C) 97.7 F (36.5 C)  (!) 97.5 F (36.4 C)  TempSrc:  Oral    SpO2: 97% 100%  98%  Weight:   70.5 kg   Height:        Physical Exam Vitals reviewed.  HENT:     Head: Normocephalic and atraumatic.  Cardiovascular:     Rate and Rhythm: Normal rate and regular rhythm.     Heart sounds: No murmur. No friction rub. No gallop.   Pulmonary:     Breath sounds: No decreased breath sounds, wheezing, rhonchi or rales.  Abdominal:     General: Bowel sounds are normal.     Palpations: Abdomen is soft. There is no mass.     Tenderness: There is no abdominal tenderness.  Musculoskeletal:     Right lower leg: No edema.     Left lower leg: No edema.  Neurological:     Mental Status: He is alert.     CBG (last 3)  Recent Labs    01/03/20 2043 01/04/20 0353 01/04/20 0743  GLUCAP 366* 302* 318*    CBC Latest Ref Rng & Units 01/02/2020 12/31/2019 12/30/2019  WBC 4.0 - 10.5 K/uL 24.3(H) 26.4(H) 22.3(H)  Hemoglobin 13.0 - 17.0 g/dL 12.3(L) 13.7 13.8  Hematocrit 39.0 - 52.0 % 38.7(L) 43.1 43.0  Platelets 150 - 400 K/uL 633(H) 767(H) 871(H)   BMP Latest Ref Rng & Units 01/02/2020 12/31/2019 12/30/2019  Glucose 70 - 99 mg/dL 168(H) 150(H) 159(H)  BUN 6 - 20 mg/dL 17 20 21(H)  Creatinine 0.61 - 1.24 mg/dL 0.65 0.67 0.71  Sodium 135 - 145  mmol/L 131(L) 130(L) 131(L)  Potassium 3.5 - 5.1 mmol/L 4.5 4.3 5.0  Chloride 98 - 111 mmol/L 87(L) 89(L) 89(L)  CO2 22 - 32 mmol/L 33(H) 31 28  Calcium 8.9 - 10.3 mg/dL 9.2 9.1 9.6   Assessment/Plan:  Active Problems:   Uncontrolled type 2 diabetes mellitus with hyperglycemia (HCC)   Essential hypertension   Status post thoracotomy   Poor dentition   Cavitary lesion of lung   History of pleural effusion   Dyslipidemia   Cerebellar mass   Hypothyroid   Normocytic anemia   Unintentional weight loss   Hemoptysis   Dysphagia   Protein-calorie malnutrition, severe  Persistent Headaches Right Cerebellar Cystic Mass: - Neurosurgery consulted, we appreciate their recommendations.  - Scheduled R suboccipital craniotomy for tumor resection on 01/05/20 - Continue Decadron 4mg  Q6H.  - Continue 1mg  Q2H  Cavitary Lung Disease Infection vs Malignancy:  Patient underwent LLL biopsy 01/01/20. Labs currently pending.    - Surgical Pathology: Active - Aerobic/Anaerobic: NGTD - Fungus Culture: Process - Acid Fast Smear: Negative - Acid Fast Culture: Process - Gram Stain: Rare WBC (PMN  and mononuclear). No organisms seen.   Type II Diabetes - Uncontrolled A1c 13.5 on admission. Pt on glimepiride 4mg  daily and metformin 1000mg  BID at home.  - SSI Resistant. - Increase Lantus 25U - Novolog 6 U with meals.   Hypertension: - Losartan 50mg  QD - HCZ 12.5mg  QD  Hypothyroidism - Levothyroxine 65mcg QD  Prior to Admission Living Arrangement: home Anticipated Discharge Location: home Barriers to Discharge: continued medical work-up  Dispo: Anticipated discharge in approximately 3-4 day(s).   Maudie Mercury, MD 01/04/2020, 10:31 AM Pager: [phone]

## 2020-01-04 NOTE — Progress Notes (Addendum)
Gary Hudson will have neurosurgery for the cerebellar lesion tomorrow.  We are still awaiting the pathology from the percutaneous lung biopsy that he had a couple days ago.  Otherwise, seems to be holding his own.  He is having some headaches because of the mass in his brain.  His blood sugars are up because of the Decadron that he is on.  We have to assume that he has an underlying malignancy.  He is lost 100 pounds in about 6 months.  Again this would be an unusual presentation with such a cavitary nature to these tumors.  Maybe, the be some pathology report back today or tomorrow.  I would have to believe that there would be more than adequate tissue with the brain lesion that is going to be removed.  Lattie Haw, MD  Isaiah 25:1  ADDENDUM: I actually got back the pathology report.  I must give interventional radiology a ton of credit for getting the biopsy.  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  Again this is incredibly interesting given that he has cystic type masses.  He is still to have the brain lesion resected.  One has to believe that this can be metastatic disease.  I will plan to send the tissue from the brain lesion to molecular analysis.  There should be more than enough tissue with the brain lesion for adequate studies.  If the brain lesion is malignant, which I have to believe it is, then he will need postop radiation to the brain.  Lattie Haw, MD  Exodus 23:25

## 2020-01-04 NOTE — Progress Notes (Signed)
   01/04/20 1200  OT Visit Information  Last OT Received On 01/04/20  Assistance Needed +1  History of Present Illness This is a 54 year old gentleman with a history of tobacco use disorder and previous empyema of the left lower lobe status post VATS decortication in June 2020.  He presents with several months of hemoptysis, headache and unintentional weight loss.  Was recently admitted to [facility] on 4/6 with these complaints and had prolonged hospital stay.  Tuberculosis was ruled out.  He had an EBUS which was negative for malignancy.  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  He was supposed to have a CT-guided biopsy but checked himself out AMA.  He presented yesterday to the [facility], ED with worsening shortness of breath and headache.  Pt scheduled for mass removal on 01/05/20.  Precautions  Precautions None  Pain Assessment  Pain Assessment Faces  Faces Pain Scale 4  Pain Location head  Pain Descriptors / Indicators Headache  Pain Intervention(s) Monitored during session;Patient requesting pain meds-RN notified  Cognition  Arousal/Alertness Awake/alert  Behavior During Therapy Flat affect  Overall Cognitive Status Within Functional Limits for tasks assessed  ADL  Overall ADL's  Modified independent  General ADL Comments Pt is routinely walking to the bathroom and sink without assistance. Pt educated in energy conservation strategies and fall prevention. Receptive, but highly focused on getting his pain medication.  Bed Mobility  Overal bed mobility Independent  General bed mobility comments gets out at bottom of bed, like bed rails up  Balance  Sitting balance-Leahy Scale Normal  Standing balance-Leahy Scale Good  Transfers  Overall transfer level Independent  Equipment used None  OT - End of Session  Activity Tolerance Patient tolerated treatment well  Patient left in bed;with call bell/phone within reach  OT Assessment/Plan  OT Plan Discharge plan  remains appropriate  OT Visit Diagnosis Muscle weakness (generalized) (M62.81);Pain  OT Frequency (ACUTE ONLY) Min 2X/week  Follow Up Recommendations No OT follow up;Supervision - Intermittent  OT Equipment 3 in 1 bedside commode  AM-PAC OT "6 Clicks" Daily Activity Outcome Measure (Version 2)  Help from another person eating meals? 4  Help from another person taking care of personal grooming? 4  Help from another person toileting, which includes using toliet, bedpan, or urinal? 4  Help from another person bathing (including washing, rinsing, drying)? 4  Help from another person to put on and taking off regular upper body clothing? 4  Help from another person to put on and taking off regular lower body clothing? 4  6 Click Score 24  OT Goal Progression  Progress towards OT goals Progressing toward goals  Acute Rehab OT Goals  Patient Stated Goal to go home  Time For Goal Achievement 01/09/20  Potential to Achieve Goals Good  OT Time Calculation  OT Start Time (ACUTE ONLY) 1056  OT Stop Time (ACUTE ONLY) 1111  OT Time Calculation (min) 15 min  OT General Charges  $OT Visit 1 Visit  OT Treatments  $Self Care/Home Management  8-22 mins  Nestor Lewandowsky, OTR/L Acute Rehabilitation Services Pager: [phone] Office: [phone]

## 2020-01-04 NOTE — Progress Notes (Signed)
Inpatient Diabetes Program Recommendations  AACE/ADA: New Consensus Statement on Inpatient Glycemic Control   Target Ranges:  Prepandial:   less than 140 mg/dL      Peak postprandial:   less than 180 mg/dL (1-2 hours)      Critically ill patients:  140 - 180 mg/dL  Results for Gary Hudson, Gary Hudson (MRN [id number]) as of 01/04/2020 13:04  Ref. Range 01/04/2020 03:53 01/04/2020 07:43 01/04/2020 11:49  Glucose-Capillary Latest Ref Range: 70 - 99 mg/dL 302 (H) 318 (H) 395 (H)   Results for Gary Hudson, Gary Hudson (MRN [id number]) as of 01/04/2020 13:04  Ref. Range 01/03/2020 08:20 01/03/2020 11:32 01/03/2020 16:42 01/03/2020 20:43  Glucose-Capillary Latest Ref Range: 70 - 99 mg/dL 289 (H) 397 (H) 356 (H) 366 (H)   Review of Glycemic Control  Diabetes history: DM2 Outpatient Diabetes medications: Lantus 30 units daily, Amaryl 4 mg daily, Metformin 1000 mg QAM, Metformin 500 mg QPM Current orders for Inpatient glycemic control:Lantus 25 units daily, Novolog 0-20 units Q4H; Decadron 4 mg Q6H  Inpatient Diabetes Program Recommendations:  Insulin-Basal:If steroids are continued, please consider increasing Lantus to 35 units daily. If MD agreeable, please also order one time Lantus 10 units x 1 now (for total of 35 units today).  Insulin-Meal Coverage:If steroids are continued, please consider ordering Novolog10units TID with meals for meal coverage if patient eats at least 50% of meals.  Thanks, Barnie Alderman, RN, MSN, CDE Diabetes Coordinator Inpatient Diabetes Program [phone] (Team Pager from 8am to 5pm)

## 2020-01-05 ENCOUNTER — Encounter (HOSPITAL_COMMUNITY)
Admission: EM | Disposition: A | Discharge status: Hospice | Payer: Self-pay | Source: Home / Self Care | Attending: Internal Medicine

## 2020-01-05 ENCOUNTER — Other Ambulatory Visit: Payer: Self-pay

## 2020-01-05 ENCOUNTER — Inpatient Hospital Stay (HOSPITAL_COMMUNITY): Payer: 59 | Admitting: Certified Registered"

## 2020-01-05 ENCOUNTER — Encounter (HOSPITAL_COMMUNITY): Payer: Self-pay | Admitting: Internal Medicine

## 2020-01-05 DIAGNOSIS — C799 Secondary malignant neoplasm of unspecified site: Secondary | ICD-10-CM | POA: Diagnosis present

## 2020-01-05 DIAGNOSIS — C3490 Malignant neoplasm of unspecified part of unspecified bronchus or lung: Secondary | ICD-10-CM

## 2020-01-05 DIAGNOSIS — C7931 Secondary malignant neoplasm of brain: Secondary | ICD-10-CM

## 2020-01-05 HISTORY — PX: CRANIOTOMY: SHX93

## 2020-01-05 LAB — GLUCOSE, CAPILLARY
Glucose-Capillary: 233 mg/dL — ABNORMAL HIGH (ref 70–99)
Glucose-Capillary: 234 mg/dL — ABNORMAL HIGH (ref 70–99)
Glucose-Capillary: 250 mg/dL — ABNORMAL HIGH (ref 70–99)
Glucose-Capillary: 256 mg/dL — ABNORMAL HIGH (ref 70–99)
Glucose-Capillary: 265 mg/dL — ABNORMAL HIGH (ref 70–99)
Glucose-Capillary: 282 mg/dL — ABNORMAL HIGH (ref 70–99)
Glucose-Capillary: 295 mg/dL — ABNORMAL HIGH (ref 70–99)
Glucose-Capillary: 296 mg/dL — ABNORMAL HIGH (ref 70–99)

## 2020-01-05 LAB — TYPE AND SCREEN
ABO/RH(D): A POS
Antibody Screen: NEGATIVE

## 2020-01-05 SURGERY — CRANIOTOMY TUMOR EXCISION
Anesthesia: General | Site: Head | Laterality: Right

## 2020-01-05 MED ORDER — INSULIN ASPART 100 UNIT/ML ~~LOC~~ SOLN: 15.0000 [IU] | Freq: Three times a day (TID) | SUBCUTANEOUS | Status: DC

## 2020-01-05 MED ORDER — FENTANYL CITRATE (PF) 250 MCG/5ML IJ SOLN
INTRAMUSCULAR | Status: DC | PRN
  Administered 2020-01-05: 100 ug via INTRAVENOUS
  Administered 2020-01-05: 150 ug via INTRAVENOUS

## 2020-01-05 MED ORDER — DEXAMETHASONE 4 MG PO TABS
6.0000 mg | ORAL_TABLET | Freq: Four times a day (QID) | ORAL | Status: AC
  Administered 2020-01-05 – 2020-01-06 (×4): 6 mg via ORAL
  Filled 2020-01-05 (×4): qty 2

## 2020-01-05 MED ORDER — INSULIN GLARGINE 100 UNIT/ML ~~LOC~~ SOLN
15.0000 [IU] | Freq: Once | SUBCUTANEOUS | Status: AC
  Administered 2020-01-05: 15 [IU] via SUBCUTANEOUS
  Filled 2020-01-05: qty 0.15

## 2020-01-05 MED ORDER — HYDROCODONE-ACETAMINOPHEN 5-325 MG PO TABS
1.0000 | ORAL_TABLET | ORAL | Status: DC | PRN
  Administered 2020-01-06 – 2020-01-10 (×13): 1 via ORAL
  Filled 2020-01-05 (×13): qty 1

## 2020-01-05 MED ORDER — PROPOFOL 10 MG/ML IV BOLUS
INTRAVENOUS | Status: DC | PRN
  Administered 2020-01-05: 200 mg via INTRAVENOUS
  Administered 2020-01-05: 50 mg via INTRAVENOUS

## 2020-01-05 MED ORDER — BUPIVACAINE HCL (PF) 0.5 % IJ SOLN
INTRAMUSCULAR | Status: AC
  Filled 2020-01-05: qty 30

## 2020-01-05 MED ORDER — ALBUTEROL SULFATE (2.5 MG/3ML) 0.083% IN NEBU
3.0000 mL | INHALATION_SOLUTION | Freq: Four times a day (QID) | RESPIRATORY_TRACT | Status: DC | PRN
  Administered 2020-01-06 – 2020-01-08 (×2): 3 mL via RESPIRATORY_TRACT
  Filled 2020-01-05 (×2): qty 3

## 2020-01-05 MED ORDER — INSULIN ASPART 100 UNIT/ML ~~LOC~~ SOLN
0.0000 [IU] | Freq: Three times a day (TID) | SUBCUTANEOUS | Status: DC
  Administered 2020-01-05: 3 [IU] via SUBCUTANEOUS

## 2020-01-05 MED ORDER — DEXAMETHASONE SODIUM PHOSPHATE 10 MG/ML IJ SOLN
INTRAMUSCULAR | Status: DC | PRN
  Administered 2020-01-05: 10 mg via INTRAVENOUS

## 2020-01-05 MED ORDER — INSULIN GLARGINE 100 UNIT/ML ~~LOC~~ SOLN
30.0000 [IU] | Freq: Every day | SUBCUTANEOUS | Status: DC
  Filled 2020-01-05: qty 0.3

## 2020-01-05 MED ORDER — OXYCODONE HCL 5 MG PO TABS: 5.0000 mg | ORAL_TABLET | Freq: Once | ORAL | Status: DC | PRN

## 2020-01-05 MED ORDER — DEXAMETHASONE 4 MG PO TABS
4.0000 mg | ORAL_TABLET | Freq: Three times a day (TID) | ORAL | Status: DC
  Administered 2020-01-07 – 2020-01-12 (×14): 4 mg via ORAL
  Filled 2020-01-05 (×15): qty 1

## 2020-01-05 MED ORDER — CEFAZOLIN SODIUM-DEXTROSE 2-4 GM/100ML-% IV SOLN
2.0000 g | INTRAVENOUS | Status: AC
  Administered 2020-01-05: 2 g via INTRAVENOUS
  Filled 2020-01-05: qty 100

## 2020-01-05 MED ORDER — MORPHINE SULFATE (PF) 2 MG/ML IV SOLN
1.0000 mg | INTRAVENOUS | Status: DC | PRN
  Administered 2020-01-06 (×4): 2 mg via INTRAVENOUS
  Administered 2020-01-07 (×2): 1 mg via INTRAVENOUS
  Administered 2020-01-08 – 2020-01-11 (×3): 2 mg via INTRAVENOUS
  Filled 2020-01-05 (×10): qty 1

## 2020-01-05 MED ORDER — CHLORHEXIDINE GLUCONATE CLOTH 2 % EX PADS
6.0000 | MEDICATED_PAD | Freq: Every day | CUTANEOUS | Status: DC
  Administered 2020-01-06 – 2020-01-12 (×7): 6 via TOPICAL

## 2020-01-05 MED ORDER — OXYCODONE HCL 5 MG/5ML PO SOLN: 5.0000 mg | Freq: Once | ORAL | Status: DC | PRN

## 2020-01-05 MED ORDER — ROCURONIUM BROMIDE 10 MG/ML (PF) SYRINGE
PREFILLED_SYRINGE | INTRAVENOUS | Status: DC | PRN
  Administered 2020-01-05: 70 mg via INTRAVENOUS
  Administered 2020-01-05: 20 mg via INTRAVENOUS

## 2020-01-05 MED ORDER — LIDOCAINE-EPINEPHRINE 0.5 %-1:200000 IJ SOLN
INTRAMUSCULAR | Status: AC
  Filled 2020-01-05: qty 1

## 2020-01-05 MED ORDER — INSULIN GLARGINE 100 UNIT/ML ~~LOC~~ SOLN: 50.0000 [IU] | Freq: Every day | SUBCUTANEOUS | Status: DC

## 2020-01-05 MED ORDER — THROMBIN 20000 UNITS EX SOLR
CUTANEOUS | Status: AC
  Filled 2020-01-05: qty 20000

## 2020-01-05 MED ORDER — FENTANYL CITRATE (PF) 100 MCG/2ML IJ SOLN
INTRAMUSCULAR | Status: AC
  Filled 2020-01-05: qty 2

## 2020-01-05 MED ORDER — CHLORHEXIDINE GLUCONATE CLOTH 2 % EX PADS: 6.0000 | MEDICATED_PAD | Freq: Once | CUTANEOUS | Status: DC

## 2020-01-05 MED ORDER — BUPIVACAINE HCL (PF) 0.5 % IJ SOLN
INTRAMUSCULAR | Status: DC | PRN
  Administered 2020-01-05: 30 mL

## 2020-01-05 MED ORDER — INSULIN ASPART 100 UNIT/ML ~~LOC~~ SOLN
1.0000 [IU] | Freq: Once | SUBCUTANEOUS | Status: AC
  Administered 2020-01-05: 1 [IU] via SUBCUTANEOUS

## 2020-01-05 MED ORDER — SODIUM CHLORIDE 0.9 % IV SOLN: INTRAVENOUS | Status: DC

## 2020-01-05 MED ORDER — MICROFIBRILLAR COLL HEMOSTAT EX PADS: MEDICATED_PAD | CUTANEOUS | Status: DC | PRN

## 2020-01-05 MED ORDER — LIDOCAINE 2% (20 MG/ML) 5 ML SYRINGE
INTRAMUSCULAR | Status: DC | PRN
  Administered 2020-01-05: 30 mg via INTRAVENOUS

## 2020-01-05 MED ORDER — FENTANYL CITRATE (PF) 100 MCG/2ML IJ SOLN
25.0000 ug | INTRAMUSCULAR | Status: DC | PRN
  Administered 2020-01-05 (×3): 50 ug via INTRAVENOUS

## 2020-01-05 MED ORDER — THROMBIN 20000 UNITS EX SOLR
CUTANEOUS | Status: DC | PRN
  Administered 2020-01-05: 20 mL via TOPICAL

## 2020-01-05 MED ORDER — 0.9 % SODIUM CHLORIDE (POUR BTL) OPTIME
TOPICAL | Status: DC | PRN
  Administered 2020-01-05 (×3): 1000 mL

## 2020-01-05 MED ORDER — NALOXONE HCL 0.4 MG/ML IJ SOLN: 0.0800 mg | INTRAMUSCULAR | Status: DC | PRN

## 2020-01-05 MED ORDER — ONDANSETRON HCL 4 MG/2ML IJ SOLN
INTRAMUSCULAR | Status: DC | PRN
  Administered 2020-01-05: 4 mg via INTRAVENOUS

## 2020-01-05 MED ORDER — INSULIN ASPART 100 UNIT/ML ~~LOC~~ SOLN
SUBCUTANEOUS | Status: AC
  Filled 2020-01-05: qty 1

## 2020-01-05 MED ORDER — SODIUM CHLORIDE 0.9 % IV SOLN: INTRAVENOUS | Status: DC | PRN

## 2020-01-05 MED ORDER — BACITRACIN ZINC 500 UNIT/GM EX OINT
TOPICAL_OINTMENT | CUTANEOUS | Status: AC
  Filled 2020-01-05: qty 28.35

## 2020-01-05 MED ORDER — MICROFIBRILLAR COLL HEMOSTAT EX PADS
MEDICATED_PAD | CUTANEOUS | Status: DC | PRN
  Administered 2020-01-05: 1 via TOPICAL

## 2020-01-05 MED ORDER — DEXAMETHASONE 4 MG PO TABS
4.0000 mg | ORAL_TABLET | Freq: Four times a day (QID) | ORAL | Status: AC
  Administered 2020-01-06 – 2020-01-07 (×4): 4 mg via ORAL
  Filled 2020-01-05 (×4): qty 1

## 2020-01-05 MED ORDER — BACITRACIN ZINC 500 UNIT/GM EX OINT
TOPICAL_OINTMENT | CUTANEOUS | Status: DC | PRN
  Administered 2020-01-05: 1 via TOPICAL

## 2020-01-05 MED ORDER — PROPOFOL 10 MG/ML IV BOLUS
INTRAVENOUS | Status: AC
  Filled 2020-01-05: qty 40

## 2020-01-05 MED ORDER — SUGAMMADEX SODIUM 200 MG/2ML IV SOLN
INTRAVENOUS | Status: DC | PRN
  Administered 2020-01-05: 200 mg via INTRAVENOUS

## 2020-01-05 MED ORDER — HYDROCHLOROTHIAZIDE 12.5 MG PO CAPS: 12.5000 mg | ORAL_CAPSULE | Freq: Every day | ORAL | Status: DC

## 2020-01-05 MED ORDER — FENTANYL CITRATE (PF) 250 MCG/5ML IJ SOLN
INTRAMUSCULAR | Status: AC
  Filled 2020-01-05: qty 5

## 2020-01-05 MED ORDER — POTASSIUM CHLORIDE IN NACL 20-0.9 MEQ/L-% IV SOLN
INTRAVENOUS | Status: DC
  Filled 2020-01-05 (×10): qty 1000

## 2020-01-05 MED ORDER — PANTOPRAZOLE SODIUM 40 MG IV SOLR: 40.0000 mg | Freq: Every day | INTRAVENOUS | Status: DC

## 2020-01-05 MED ORDER — PROMETHAZINE HCL 25 MG/ML IJ SOLN: 6.2500 mg | INTRAMUSCULAR | Status: DC | PRN

## 2020-01-05 MED ORDER — LABETALOL HCL 5 MG/ML IV SOLN: 10.0000 mg | INTRAVENOUS | Status: DC | PRN

## 2020-01-05 MED ORDER — LIDOCAINE-EPINEPHRINE 0.5 %-1:200000 IJ SOLN
INTRAMUSCULAR | Status: DC | PRN
  Administered 2020-01-05: 6 mL via INTRADERMAL

## 2020-01-05 MED ORDER — GLIMEPIRIDE 4 MG PO TABS
4.0000 mg | ORAL_TABLET | Freq: Every day | ORAL | Status: DC
  Administered 2020-01-06: 4 mg via ORAL
  Filled 2020-01-05 (×2): qty 1

## 2020-01-05 SURGICAL SUPPLY — 82 items
APL SKNCLS STERI-STRIP NONHPOA (GAUZE/BANDAGES/DRESSINGS)
BAND INSRT 18 STRL LF DISP RB (MISCELLANEOUS)
BAND RUBBER #18 3X1/16 STRL (MISCELLANEOUS) IMPLANT
BENZOIN TINCTURE PRP APPL 2/3 (GAUZE/BANDAGES/DRESSINGS) IMPLANT
BLADE CLIPPER SURG (BLADE) ×2 IMPLANT
BLADE SAW GIGLI 16 STRL (MISCELLANEOUS) IMPLANT
BLADE SURG 15 STRL LF DISP TIS (BLADE) IMPLANT
BLADE SURG 15 STRL SS (BLADE)
BLADE ULTRA TIP 2M (BLADE) IMPLANT
BNDG GAUZE ELAST 4 BULKY (GAUZE/BANDAGES/DRESSINGS) IMPLANT
BUR ACORN 6.0 PRECISION (BURR) ×2 IMPLANT
BUR MATCHSTICK NEURO 3.0 LAGG (BURR) IMPLANT
BUR SPIRAL ROUTER 2.3 (BUR) IMPLANT
CANISTER SUCT 3000ML PPV (MISCELLANEOUS) ×2 IMPLANT
CARTRIDGE OIL MAESTRO DRILL (MISCELLANEOUS) ×1 IMPLANT
CATH VENTRIC 35X38 W/TROCAR LG (CATHETERS) IMPLANT
CLIP VESOCCLUDE MED 6/CT (CLIP) IMPLANT
CNTNR URN SCR LID CUP LEK RST (MISCELLANEOUS) ×2 IMPLANT
CONT SPEC 4OZ STRL OR WHT (MISCELLANEOUS) ×4
COVER WAND RF STERILE (DRAPES) ×2 IMPLANT
DECANTER SPIKE VIAL GLASS SM (MISCELLANEOUS) ×2 IMPLANT
DIFFUSER DRILL AIR PNEUMATIC (MISCELLANEOUS) ×2 IMPLANT
DRAIN SUBARACHNOID (WOUND CARE) IMPLANT
DRAPE CAMERA VIDEO/LASER (DRAPES) IMPLANT
DRAPE MICROSCOPE LEICA (MISCELLANEOUS) IMPLANT
DRAPE NEUROLOGICAL W/INCISE (DRAPES) ×2 IMPLANT
DRAPE ORTHO SPLIT 77X108 STRL (DRAPES)
DRAPE SURG 17X23 STRL (DRAPES) IMPLANT
DRAPE SURG ORHT 6 SPLT 77X108 (DRAPES) IMPLANT
DRAPE WARM FLUID 44X44 (DRAPES) ×2 IMPLANT
DRSG OPSITE POSTOP 4X6 (GAUZE/BANDAGES/DRESSINGS) ×2 IMPLANT
DURAPREP 6ML APPLICATOR 50/CS (WOUND CARE) ×2 IMPLANT
ELECT REM PT RETURN 9FT ADLT (ELECTROSURGICAL) ×2
ELECTRODE REM PT RTRN 9FT ADLT (ELECTROSURGICAL) ×1 IMPLANT
EVACUATOR 1/8 PVC DRAIN (DRAIN) IMPLANT
EVACUATOR SILICONE 100CC (DRAIN) IMPLANT
GAUZE 4X4 16PLY RFD (DISPOSABLE) IMPLANT
GAUZE SPONGE 4X4 12PLY STRL (GAUZE/BANDAGES/DRESSINGS) ×2 IMPLANT
GLOVE BIO SURGEON STRL SZ 6.5 (GLOVE) ×2 IMPLANT
GLOVE BIO SURGEON STRL SZ7 (GLOVE) ×2 IMPLANT
GLOVE BIOGEL PI IND STRL 7.5 (GLOVE) ×1 IMPLANT
GLOVE BIOGEL PI INDICATOR 7.5 (GLOVE) ×1
GLOVE ECLIPSE 6.5 STRL STRAW (GLOVE) ×2 IMPLANT
GLOVE EXAM NITRILE XL STR (GLOVE) IMPLANT
GOWN STRL REUS W/ TWL LRG LVL3 (GOWN DISPOSABLE) ×4 IMPLANT
GOWN STRL REUS W/ TWL XL LVL3 (GOWN DISPOSABLE) IMPLANT
GOWN STRL REUS W/TWL 2XL LVL3 (GOWN DISPOSABLE) IMPLANT
GOWN STRL REUS W/TWL LRG LVL3 (GOWN DISPOSABLE) ×8
GOWN STRL REUS W/TWL XL LVL3 (GOWN DISPOSABLE)
GRAFT DURAGEN MATRIX 2WX2L ×2 IMPLANT
HEMOSTAT SURGICEL 2X14 (HEMOSTASIS) IMPLANT
HOOK DURA 1/2IN (MISCELLANEOUS) ×2 IMPLANT
KIT BASIN OR (CUSTOM PROCEDURE TRAY) ×2 IMPLANT
KIT DRAIN CSF ACCUDRAIN (MISCELLANEOUS) IMPLANT
KIT TURNOVER KIT B (KITS) ×2 IMPLANT
NEEDLE HYPO 25X1 1.5 SAFETY (NEEDLE) ×2 IMPLANT
NEEDLE SPNL 18GX3.5 QUINCKE PK (NEEDLE) IMPLANT
NS IRRIG 1000ML POUR BTL (IV SOLUTION) ×2 IMPLANT
OIL CARTRIDGE MAESTRO DRILL (MISCELLANEOUS) ×2
PACK CRANIOTOMY CUSTOM (CUSTOM PROCEDURE TRAY) ×2 IMPLANT
PATTIES SURGICAL .25X.25 (GAUZE/BANDAGES/DRESSINGS) IMPLANT
PATTIES SURGICAL .5 X.5 (GAUZE/BANDAGES/DRESSINGS) IMPLANT
PATTIES SURGICAL .5 X3 (DISPOSABLE) IMPLANT
PATTIES SURGICAL 1/4 X 3 (GAUZE/BANDAGES/DRESSINGS) IMPLANT
PATTIES SURGICAL 1X1 (DISPOSABLE) IMPLANT
SPECIMEN JAR SMALL (MISCELLANEOUS) IMPLANT
SPONGE NEURO XRAY DETECT 1X3 (DISPOSABLE) IMPLANT
SPONGE SURGIFOAM ABS GEL 100 (HEMOSTASIS) ×2 IMPLANT
STAPLER VISISTAT 35W (STAPLE) ×2 IMPLANT
SUT ETHILON 3 0 FSL (SUTURE) ×2 IMPLANT
SUT ETHILON 3 0 PS 1 (SUTURE) IMPLANT
SUT NURALON 4 0 TR CR/8 (SUTURE) ×4 IMPLANT
SUT SILK 0 TIES 10X30 (SUTURE) IMPLANT
SUT STEEL 0 (SUTURE)
SUT STEEL 0 18XMFL TIE 17 (SUTURE) IMPLANT
SUT VIC AB 2-0 CT2 18 VCP726D (SUTURE) ×6 IMPLANT
TOWEL GREEN STERILE (TOWEL DISPOSABLE) ×2 IMPLANT
TOWEL GREEN STERILE FF (TOWEL DISPOSABLE) ×2 IMPLANT
TRAY FOLEY MTR SLVR 16FR STAT (SET/KITS/TRAYS/PACK) ×2 IMPLANT
TUBE CONNECTING 12X1/4 (SUCTIONS) ×2 IMPLANT
UNDERPAD 30X36 HEAVY ABSORB (UNDERPADS AND DIAPERS) ×2 IMPLANT
WATER STERILE IRR 1000ML POUR (IV SOLUTION) ×2 IMPLANT

## 2020-01-05 NOTE — Progress Notes (Signed)
Patient had a brown emesis, Zofran IV given. Will continue to monitor patient

## 2020-01-05 NOTE — Progress Notes (Signed)
Inpatient Diabetes Program Recommendations  AACE/ADA: New Consensus Statement on Inpatient Glycemic Control   Target Ranges:  Prepandial:   less than 140 mg/dL      Peak postprandial:   less than 180 mg/dL (1-2 hours)      Critically ill patients:  140 - 180 mg/dL   Results for LUNDY, COZART (MRN [id number]) as of 01/05/2020 08:23  Ref. Range 01/04/2020 07:43 01/04/2020 11:49 01/04/2020 17:33 01/04/2020 20:35 01/05/2020 05:51 01/05/2020 07:30  Glucose-Capillary Latest Ref Range: 70 - 99 mg/dL 318 (H) 395 (H) 462 (H) 397 (H) 256 (H) 296 (H)   Review of Glycemic Control  Diabetes history:DM2 Outpatient Diabetes medications:Lantus 30 units daily, Amaryl 4 mg daily, Metformin 1000 mg QAM, Metformin 500 mg QPM Current orders for Inpatient glycemic control:Lantus35units daily, Novolog 0-20 units TID with meals, Novolog 0-5 units QHS, Novolog 10 units TID with meals; Decadron 4 mg Q6H  Inpatient Diabetes Program Recommendations:  Insulin-Basal:If steroids are continued, please consider increasing Lantus to50 units daily.   Insulin-Meal Coverage:If steroids are continued, please consider ordering Novolog15units TID with meals for meal coverage if patient eats at least 50% of meals.  Thanks, Barnie Alderman, RN, MSN, CDE Diabetes Coordinator Inpatient Diabetes Program [phone] (Team Pager from 8am to 5pm)

## 2020-01-05 NOTE — Anesthesia Preprocedure Evaluation (Addendum)
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 on medications (-) angina Rhythm:Regular Rate:Normal  12/26/2019 ECHO:  EF 60-65%, valves OK   Neuro/Psych Brain mets    GI/Hepatic negative GI ROS, Neg liver ROS,   Endo/Other  diabetes (glu 282), Oral Hypoglycemic Agents, Insulin DependentHypothyroidism   Renal/GU negative Renal ROS     Musculoskeletal   Abdominal   Peds  Hematology negative hematology ROS (+)   Anesthesia Other Findings Patient had a recent hospitalization at [place] one month ago for hemoptysis and weight loss and was found to have enlargement of his prior cavitary lung lesion with new bilateral smaller lesions, a right cerebellar cystic mass and vocal cord paralysis. Extensive work up for TB and malignancy including EBUS and biopsy were all negative  Reproductive/Obstetrics                            Anesthesia Physical Anesthesia Plan  ASA: III  Anesthesia Plan: General   Post-op Pain Management:    Induction: Intravenous  PONV Risk Score and Plan: 2 and Ondansetron and Dexamethasone  Airway Management Planned: Oral ETT  Additional Equipment: Arterial line  Intra-op Plan:   Post-operative Plan:  Extubation in OR  Informed Consent: I have reviewed the patients History and Physical, chart, labs and discussed the procedure including the risks, benefits and alternatives for the proposed anesthesia with the patient or authorized representative who has indicated his/her understanding and acceptance.     Dental advisory given  Plan Discussed with: Surgeon and CRNA  Anesthesia Plan Comments:        Anesthesia Quick Evaluation

## 2020-01-05 NOTE — Transfer of Care (Signed)
Immediate Anesthesia Transfer of Care Note  Patient: Gary Hudson  Procedure(s) Performed: Right Suboccipital craniectomy for tumor (Right Head)  Patient Location: PACU  Anesthesia Type:General  Level of Consciousness: drowsy  Airway & Oxygen Therapy: Patient Spontanous Breathing and Patient connected to face mask oxygen  Post-op Assessment: Report given to RN and Post -op Vital signs reviewed and stable  Post vital signs: Reviewed and stable  Last Vitals:  Vitals Value Taken Time  BP    Temp    Pulse 71 01/05/20 2118  Resp 20 01/05/20 2118  SpO2 95 % 01/05/20 2118  Vitals shown include unvalidated device data.  Last Pain:  Vitals:   01/05/20 1444  TempSrc:   PainSc: 8       Patients Stated Pain Goal: 4 (01/00/71 2197)  Complications: No apparent anesthesia complications

## 2020-01-05 NOTE — Anesthesia Postprocedure Evaluation (Signed)
Anesthesia Post Note  Patient: Reginal Wojcicki Sabino  Procedure(s) Performed: Right Suboccipital craniectomy for tumor (Right Head)     Patient location during evaluation: PACU Anesthesia Type: General Level of consciousness: awake and alert Pain management: pain level controlled Vital Signs Assessment: post-procedure vital signs reviewed and stable Respiratory status: spontaneous breathing, nonlabored ventilation and respiratory function stable Cardiovascular status: blood pressure returned to baseline and stable Postop Assessment: no apparent nausea or vomiting Anesthetic complications: no    Last Vitals:  Vitals:   01/05/20 2215 01/05/20 2230  BP: (!) 170/72 (!) 159/86  Pulse: (!) 56 65  Resp: 18 (!) 28  Temp:    SpO2: 97% 98%                   Audry Pili

## 2020-01-05 NOTE — Progress Notes (Signed)
Subjective:  O/N Events: None  Gary Hudson was seen at bedside this morning. Pt states that Gary Hudson has not had an update on the results of his biopsy results. 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 Family voiced understanding.   Of note, the patient is scheduled for surgery at 2:40PM today. The patient last ate breakfast around 8-8:30 AM.  Objective:  Vital signs in last 24 hours: Vitals:   01/04/20 0742 01/04/20 1735 01/04/20 2123 01/05/20 0548  BP: 119/79 127/88 129/73 (!) 155/64  Pulse: 67 68 69 61  Resp: 16 16 20    Temp: (!) 97.5 F (36.4 C) 97.6 F (36.4 C) 97.8 F (36.6 C) 97.7 F (36.5 C)  TempSrc:   Oral   SpO2: 98% 95% 97% 98%  Weight:      Height:        Physical Exam HENT:     Head: Normocephalic and atraumatic.  Cardiovascular:     Rate and Rhythm: Normal rate and regular rhythm.  Pulmonary:     Effort: Pulmonary effort is normal.     Breath sounds: No decreased breath sounds, wheezing, rhonchi or rales.  Abdominal:     General: Bowel sounds are normal.     Palpations: Abdomen is soft.  Neurological:     Mental Status: Gary Hudson is alert and oriented to person, place, and time.  Psychiatric:        Mood and Affect: Mood normal.        Behavior: Behavior normal.      CBG (last 3)  Recent Labs    01/04/20 1733 01/04/20 2035 01/05/20 0551  GLUCAP 462* 397* 256*    CBC Latest Ref Rng & Units 01/02/2020 12/31/2019 12/30/2019  WBC 4.0 - 10.5 K/uL 24.3(H) 26.4(H) 22.3(H)  Hemoglobin 13.0 - 17.0 g/dL 12.3(L) 13.7 13.8  Hematocrit 39.0 - 52.0 % 38.7(L) 43.1 43.0  Platelets 150 - 400 K/uL 633(H) 767(H) 871(H)   BMP Latest Ref Rng & Units 01/02/2020 12/31/2019 12/30/2019  Glucose 70 - 99 mg/dL 168(H) 150(H) 159(H)  BUN 6 - 20 mg/dL 17 20 21(H)  Creatinine 0.61 - 1.24 mg/dL 0.65 0.67 0.71  Sodium 135 - 145 mmol/L 131(L) 130(L) 131(L)  Potassium 3.5 - 5.1 mmol/L 4.5 4.3 5.0    Chloride 98 - 111 mmol/L 87(L) 89(L) 89(L)  CO2 22 - 32 mmol/L 33(H) 31 28  Calcium 8.9 - 10.3 mg/dL 9.2 9.1 9.6   Assessment/Plan:  Active Problems:   Uncontrolled type 2 diabetes mellitus with hyperglycemia (HCC)   Essential hypertension   Status post thoracotomy   Poor dentition   Cavitary lesion of lung   History of pleural effusion   Dyslipidemia   Cerebellar mass   Hypothyroid   Normocytic anemia   Unintentional weight loss   Hemoptysis   Dysphagia   Protein-calorie malnutrition, severe  Persistent Headaches Right Cerebellar Cystic Mass: Patient ate this morning at 8:00-8:30, we reached out to anesthesiology concerning the matter, states that [facility] abides by 8 hour policy, and that patient should be pushed back to 4:00 pm. After receiving this information, I reached out to [facility] and gave an update. Patient instructed to no longer eat or drink until surgery, NPO. Insulin held.  - Neurosurgery consulted, we appreciate their recommendations.  - Scheduled R suboccipital craniotomy for tumor resection on 01/05/20. - Discontinued Decadron 4mg  Q6H.  - Continue dilaudid 1mg  Q2H. -  NPO  Cavitary Lung Disease Infection vs Malignancy:  Patient underwent LLL biopsy 01/01/20. Labs currently pending.    - Surgical Pathology: squamous cell carcinoma is favored; however, the differential diagnosis includes adenosquamous carcinoma - Aerobic/Anaerobic: NGTD - Fungus Culture: Process - Acid Fast Smear: Negative - Acid Fast Culture: Process - Gram Stain: Rare WBC (PMN and mononuclear). No organisms seen.   Type II Diabetes - Uncontrolled A1c 13.5 on admission. Pt on glimepiride 4mg  daily and metformin 1000mg  BID at home. Nursing staff told to hold insulin at this time.  - SSI sensitive. - Lantus 30U - DC Novolog 15U with meals.   Hypertension: - Losartan 50mg  QD - HCZ 12.5mg  QD  Hypothyroidism - Levothyroxine 73mcg QD  Prior to Admission Living Arrangement:  home Anticipated Discharge Location: home Barriers to Discharge: continued medical work-up  Dispo: Anticipated discharge in approximately 3-4 day(s).   Maudie Mercury, MD 01/05/2020, 7:17 AM Pager: [phone]

## 2020-01-05 NOTE — Progress Notes (Signed)
Pt's CBG 265 after one unit of novolog insulin given. Updated Dr. Glennon Mac and received no new orders to cover CBG.

## 2020-01-05 NOTE — Anesthesia Procedure Notes (Signed)
Arterial Line Insertion Start/End5/14/2021 5:20 PM, 01/05/2020 5:30 PM Performed by: Milford Cage, CRNA  Patient location: Pre-op. Preanesthetic checklist: patient identified, IV checked, site marked, risks and benefits discussed, surgical consent, monitors and equipment checked, pre-op evaluation, timeout performed and anesthesia consent Lidocaine 1% used for infiltration Right, radial was placed Catheter size: 20 Fr Hand hygiene performed , maximum sterile barriers used  and Seldinger technique used  Attempts: 1 Procedure performed using ultrasound guided technique. Ultrasound Notes:anatomy identified, needle tip was noted to be adjacent to the nerve/plexus identified and no ultrasound evidence of intravascular and/or intraneural injection Following insertion, dressing applied and Biopatch. Post procedure assessment: normal and unchanged  Patient tolerated the procedure with difficulty.

## 2020-01-05 NOTE — Anesthesia Procedure Notes (Signed)
Procedure Name: Intubation Date/Time: 01/05/2020 6:58 PM Performed by: Eligha Bridegroom, CRNA Pre-anesthesia Checklist: Patient identified, Emergency Drugs available, Suction available and Patient being monitored Oxygen Delivery Method: Circle system utilized Preoxygenation: Pre-oxygenation with 100% oxygen Induction Type: IV induction Ventilation: Mask ventilation without difficulty Laryngoscope Size: Glidescope Grade View: Grade III Tube type: Oral Tube size: 8.0 mm Number of attempts: 2 Airway Equipment and Method: Video-laryngoscopy Dental Injury: Teeth and Oropharynx as per pre-operative assessment  Difficulty Due To: Difficult Airway- due to dentition

## 2020-01-05 NOTE — Op Note (Signed)
01/05/2020  9:22 PM  PATIENT:  Gary Hudson  54 y.o. male  Admitted for severe headaches and gait difficulties. Head CT then MRI brain revealed a large cystic mass in the right cerebellar hemisphere.  PRE-OPERATIVE DIAGNOSIS:  Cystic Rim Enhancing Mass  POST-OPERATIVE DIAGNOSIS:  Cystic Rim Enhancing Mass  PROCEDURE:  Procedure(s): Right Suboccipital craniectomy for tumor  SURGEON: Surgeon(s): Ashok Pall, MD  ASSISTANTS:none  ANESTHESIA:   general  EBL:  Total I/O In: 1800 [I.V.:1800] Out: 510 [Urine:410; Blood:100]  BLOOD ADMINISTERED:none  CELL SAVER GIVEN:none  COUNT:per nursing  DRAINS: none   SPECIMEN:  Source of Specimen:  cerebellum  DICTATION: Rashee A Wiedel was taken to the operating room, intubated, and placed under a general anesthetic without difficulty. A Mayfield head holder was placed once adequate anesthesia was obtained.  He was positioned prone with his head secured to the table with a mayfield adapter. All pressure points were properly padded. His head was shaved and prepped  and draped in a sterile manner. I infiltrated the planned incision with lidocaine.  I incised the scalp with a 10 blade and dissected sharply to the nuchal ligament. I exposed the occiput, and the posterior arch of C1. I used the monopolar cautery to widen my exposure on the occiput and placed a cerebellar retractor. I used the drill to thin the occipital bone on the right of midline. Once thinned I used dissectors then kerrison punches to expose the dura.  I opened the foramen magnum, then opened the cisterna magna to gain relaxation of the cerebellar hemisphere. I opened the dura in a cruciate fashion to expose the cerebellum. I used bipolar cautery to score the cerebellar surface then introduced a brain needle into the cyst. I aspirated brownish liquid and observed the collapse of the right hemisphere. I used forceps to open the hole made by the needle and removed some solid material  which I believed to be cyst wall. I removed more cyst fluid with suction. I irrigated copiously and had good hemostasis within the cyst. Both the cyst fluid and solid biopsies were sent to pathology.  I then loosely approximated the dura and placed a piece of duragen over the dura. I approximated the nuchal ligament, and subcutaneous planes with vicryl sutures. I approximated the skin and scalp edges with a nylon suture. I placed a sterile dressing. I detached the head holder from the bed, and he was rolled onto his ICU bed. I removed the Mayfield. He was extubated in the OR.   PLAN OF CARE: Admit to inpatient   PATIENT DISPOSITION:  PACU - hemodynamically stable.   Delay start of Pharmacological VTE agent (>24hrs) due to surgical blood loss or risk of bleeding:  yes

## 2020-01-06 ENCOUNTER — Inpatient Hospital Stay (HOSPITAL_COMMUNITY): Payer: 59

## 2020-01-06 LAB — GLUCOSE, CAPILLARY
Glucose-Capillary: 218 mg/dL — ABNORMAL HIGH (ref 70–99)
Glucose-Capillary: 188 mg/dL — ABNORMAL HIGH (ref 70–99)
Glucose-Capillary: 341 mg/dL — ABNORMAL HIGH (ref 70–99)
Glucose-Capillary: 305 mg/dL — ABNORMAL HIGH (ref 70–99)
Glucose-Capillary: 341 mg/dL — ABNORMAL HIGH (ref 70–99)
Glucose-Capillary: 178 mg/dL — ABNORMAL HIGH (ref 70–99)

## 2020-01-06 LAB — AEROBIC/ANAEROBIC CULTURE W GRAM STAIN (SURGICAL/DEEP WOUND)

## 2020-01-06 LAB — MRSA PCR SCREENING: MRSA by PCR: NEGATIVE

## 2020-01-06 MED ORDER — INSULIN GLARGINE 100 UNIT/ML ~~LOC~~ SOLN
50.0000 [IU] | Freq: Every day | SUBCUTANEOUS | Status: DC
  Administered 2020-01-06: 50 [IU] via SUBCUTANEOUS
  Filled 2020-01-06 (×2): qty 0.5

## 2020-01-06 MED ORDER — INSULIN ASPART 100 UNIT/ML ~~LOC~~ SOLN
0.0000 [IU] | SUBCUTANEOUS | Status: DC
  Administered 2020-01-06: 4 [IU] via SUBCUTANEOUS
  Administered 2020-01-06: 15 [IU] via SUBCUTANEOUS
  Administered 2020-01-07: 3 [IU] via SUBCUTANEOUS
  Administered 2020-01-07: 4 [IU] via SUBCUTANEOUS
  Administered 2020-01-07: 7 [IU] via SUBCUTANEOUS
  Administered 2020-01-07: 3 [IU] via SUBCUTANEOUS
  Administered 2020-01-08: 11 [IU] via SUBCUTANEOUS
  Administered 2020-01-08: 4 [IU] via SUBCUTANEOUS
  Administered 2020-01-08: 15 [IU] via SUBCUTANEOUS
  Administered 2020-01-09 (×3): 7 [IU] via SUBCUTANEOUS
  Administered 2020-01-09: 3 [IU] via SUBCUTANEOUS
  Administered 2020-01-09: 4 [IU] via SUBCUTANEOUS
  Administered 2020-01-10 (×3): 11 [IU] via SUBCUTANEOUS
  Administered 2020-01-10: 3 [IU] via SUBCUTANEOUS
  Administered 2020-01-10: 11 [IU] via SUBCUTANEOUS
  Administered 2020-01-10: 7 [IU] via SUBCUTANEOUS
  Administered 2020-01-11: 4 [IU] via SUBCUTANEOUS
  Administered 2020-01-11 (×2): 11 [IU] via SUBCUTANEOUS
  Administered 2020-01-11: 7 [IU] via SUBCUTANEOUS
  Administered 2020-01-11: 20 [IU] via SUBCUTANEOUS
  Administered 2020-01-11: 11 [IU] via SUBCUTANEOUS
  Administered 2020-01-12: 15 [IU] via SUBCUTANEOUS
  Administered 2020-01-12: 20 [IU] via SUBCUTANEOUS

## 2020-01-06 MED ORDER — PANTOPRAZOLE SODIUM 40 MG PO TBEC
40.0000 mg | DELAYED_RELEASE_TABLET | Freq: Every day | ORAL | Status: DC
  Administered 2020-01-06 – 2020-01-11 (×6): 40 mg via ORAL
  Filled 2020-01-06 (×6): qty 1

## 2020-01-06 MED ORDER — DIAZEPAM 5 MG PO TABS
5.0000 mg | ORAL_TABLET | Freq: Once | ORAL | Status: AC
  Administered 2020-01-06: 5 mg via ORAL
  Filled 2020-01-06: qty 1

## 2020-01-06 MED ORDER — INSULIN ASPART 100 UNIT/ML ~~LOC~~ SOLN
6.0000 [IU] | Freq: Three times a day (TID) | SUBCUTANEOUS | Status: DC
  Administered 2020-01-06 (×3): 6 [IU] via SUBCUTANEOUS

## 2020-01-06 MED ORDER — INSULIN ASPART 100 UNIT/ML ~~LOC~~ SOLN: 0.0000 [IU] | Freq: Every day | SUBCUTANEOUS | Status: DC

## 2020-01-06 MED ORDER — INSULIN ASPART 100 UNIT/ML ~~LOC~~ SOLN
0.0000 [IU] | Freq: Three times a day (TID) | SUBCUTANEOUS | Status: DC
  Administered 2020-01-06: 4 [IU] via SUBCUTANEOUS
  Administered 2020-01-06 (×2): 15 [IU] via SUBCUTANEOUS

## 2020-01-06 NOTE — Plan of Care (Signed)
  Problem: Clinical Measurements: Goal: Ability to maintain clinical measurements within normal limits will improve Outcome: Progressing Goal: Will remain free from infection Outcome: Progressing Goal: Diagnostic test results will improve Outcome: Progressing Goal: Cardiovascular complication will be avoided Outcome: Progressing   Problem: Activity: Goal: Risk for activity intolerance will decrease Outcome: Progressing   Problem: Coping: Goal: Level of anxiety will decrease Outcome: Progressing   Problem: Pain Managment: Goal: General experience of comfort will improve Outcome: Progressing   Problem: Skin Integrity: Goal: Risk for impaired skin integrity will decrease Outcome: Progressing   Problem: Nutrition: Goal: Adequate nutrition will be maintained Outcome: Completed/Met

## 2020-01-06 NOTE — Progress Notes (Signed)
Pt unable to tolerate MRI due to anxiety despite pre-scan medication administration. Currently obtaining CT head as discussed with MD Sherley Bounds this AM at bedside if unable to obtain MRI. Vital signs stable at this time. Will continue to monitor.

## 2020-01-06 NOTE — Plan of Care (Signed)
  Problem: Clinical Measurements: Goal: Complications related to the disease process, condition or treatment will be avoided or minimized Outcome: Progressing   

## 2020-01-06 NOTE — Progress Notes (Signed)
Patient ID: Gary Hudson, male   DOB: [birth year], 54 y.o.   MRN: [id number] BP (!) 157/89   Pulse 65   Temp 97.6 F (36.4 C) (Oral)   Resp 15   Ht 5' 10.98" (1.803 m)   Wt 76.1 kg   SpO2 98%   BMI 23.41 kg/m  Scanned reviewed. Cyst is smaller. No apparent complications. Did not tolerate mri. Surgical pathology review is pending.

## 2020-01-06 NOTE — Progress Notes (Signed)
Patient is requiring increased O2 needs with changes in lung assessment.  BG currently 341 w/o adequate insulin coverage. Dr Frederico Hamman with IM notified. New orders received.

## 2020-01-06 NOTE — Progress Notes (Signed)
Patient ID: Gary Hudson, male   DOB: 10/27/1965, 54 y.o.   MRN: [id number] Complains of significant frontal headaches this morning.  His dressing is dry and flat.  He is awake and conversant.  He moves all extremities.  He looks to be in some discomfort.  Will check head CT this morning.

## 2020-01-06 NOTE — Progress Notes (Addendum)
Subjective:  O/N Events: None   Patient seen this PM. Patient states that he still endorses a headache. He is otherwise doing well. He tolerated his surgery well. He has no complaints at this time.   Objective:  Vital signs in last 24 hours: Vitals:   01/06/20 0200 01/06/20 0300 01/06/20 0400 01/06/20 0500  BP: (!) 147/78 (!) 172/110 (!) 161/72   Pulse: 64 81 66   Resp: 15 19 14    Temp:      TempSrc:      SpO2: 100% 99% 100%   Weight:    76.1 kg  Height:       Physical Exam Constitutional:      General: He is not in acute distress.    Appearance: He is normal weight. He is ill-appearing.  HENT:     Head: Normocephalic and atraumatic.  Cardiovascular:     Rate and Rhythm: Normal rate and regular rhythm.     Heart sounds: No murmur. No friction rub. No gallop.   Pulmonary:     Effort: Pulmonary effort is normal.     Breath sounds: Normal breath sounds.  Musculoskeletal:     Right lower leg: No tenderness.     Left lower leg: No tenderness.  Neurological:     Mental Status: He is alert and oriented to person, place, and time.  Psychiatric:        Mood and Affect: Mood normal.        Behavior: Behavior normal.     CBG (last 3)  Recent Labs    01/05/20 2001 01/05/20 2206 01/05/20 2339  GLUCAP 234* 250* 233*    CBC Latest Ref Rng & Units 01/02/2020 12/31/2019 12/30/2019  WBC 4.0 - 10.5 K/uL 24.3(H) 26.4(H) 22.3(H)  Hemoglobin 13.0 - 17.0 g/dL 12.3(L) 13.7 13.8  Hematocrit 39.0 - 52.0 % 38.7(L) 43.1 43.0  Platelets 150 - 400 K/uL 633(H) 767(H) 871(H)   BMP Latest Ref Rng & Units 01/02/2020 12/31/2019 12/30/2019  Glucose 70 - 99 mg/dL 168(H) 150(H) 159(H)  BUN 6 - 20 mg/dL 17 20 21(H)  Creatinine 0.61 - 1.24 mg/dL 0.65 0.67 0.71  Sodium 135 - 145 mmol/L 131(L) 130(L) 131(L)  Potassium 3.5 - 5.1 mmol/L 4.5 4.3 5.0  Chloride 98 - 111 mmol/L 87(L) 89(L) 89(L)  CO2 22 - 32 mmol/L 33(H) 31 28  Calcium 8.9 - 10.3 mg/dL 9.2 9.1 9.6   Assessment/Plan:  Principal  Problem:   Lung cancer metastatic to brain [facility]) Active Problems:   Uncontrolled type 2 diabetes mellitus with hyperglycemia (HCC)   Essential hypertension   Status post thoracotomy   Poor dentition   Cavitary lesion of lung   History of pleural effusion   Dyslipidemia   Cerebellar mass   Hypothyroid   Normocytic anemia   Unintentional weight loss   Hemoptysis   Dysphagia   Protein-calorie malnutrition, severe   Adenocarcinoma, metastatic (HCC)  Persistent Headaches Right Cerebellar Cystic Mass: Patient status post R suboccipital craniotomy - Neurosurgery consulted, we appreciate their recommendations.  - Surgical pathology: Collected  - Decadron Taper per neurosurgery.  - Continue dilaudid 1mg  Q2H.  Cavitary Lung Disease Infection vs Malignancy:  Patient underwent LLL biopsy 01/01/20. Labs currently pending.    - Surgical Pathology: squamous cell carcinoma is favored; however, the differential diagnosis includes adenosquamous carcinoma - Aerobic/Anaerobic: NGTD - Fungus Culture: Process - Acid Fast Smear: Negative - Acid Fast Culture: Process - Gram Stain: Rare WBC (PMN and mononuclear). No organisms seen.  Type II Diabetes - Uncontrolled A1c 13.5 on admission. Pt on glimepiride 4mg  daily and metformin 1000mg  BID at home. Patient placed on Decadron taper. - SSI resistant. - Lantus 50U - Novolog 15U with meals.   Hypertension: - Losartan 50mg  QD - HCZ 12.5mg  QD  Hypothyroidism - Levothyroxine 62mcg QD  Prior to Admission Living Arrangement: home Anticipated Discharge Location: home Barriers to Discharge: continued medical work-up  Dispo: Anticipated discharge in approximately 3-4 day(s).   Maudie Mercury, MD 01/06/2020, 5:58 AM Pager: [phone]

## 2020-01-06 NOTE — Progress Notes (Signed)
Paged and spoke with Maudie Mercury MD about pt's new oxygen requirements to non-rebreather. Oxygen now ay 98%, vital sings within normal limits. MD Gilford Rile will be up to see patient. Will continue to monitor.

## 2020-01-06 NOTE — Progress Notes (Signed)
Attempted MRI Brain w/wo contrast. Patient was cooperative with attempt and made good effort to tolerate scanner. Patient used alert button before any diagnostic imaging could be completed. Patient stated difficulty breathing inside scanner and stated clearly multiple times that he wanted out.

## 2020-01-06 NOTE — Progress Notes (Signed)
Paged by RN due to tachypnea and increased O2 requirements. Went to evaluate patient at bedside. He was resting comfortably in bed, sleeping. RR 29 and SpO2 94% on 5L by nasal canula. He denies coughing but RN reports blood-tinged sputum. On exam, he is sleeping on his L side but easily arousable to voice. RRR. Coarse lung sounds bilaterally, more so on the left. Does not appear volume up. CXR ordered which showed numerous bilateral cavitary lesions similar to prior CT with increased L hazy lower lung opacity that could be superimposed infection vs inflammation.   Suspect changes in imaging are likely secondary to inflammation as vital signs and oxygenation were reassuring when seen. Will therefore hold off on antibiotics for now. However, will have a low threshold to start broad spectrum coverage if he decompensates as he is at high risk for aspiration event and HCAP due to prolonged hospital stay. Could also mask a fever while on steroids.   Continue to monitor closely overnight  Ordered incentive spirometer  Goal O2 sats 88-92% given hx of COPD Low threshold to start broad spectrum antibiotics   Welford Roche, MD  Internal Medicine PGY-3

## 2020-01-07 LAB — GLUCOSE, CAPILLARY
Glucose-Capillary: 137 mg/dL — ABNORMAL HIGH (ref 70–99)
Glucose-Capillary: 149 mg/dL — ABNORMAL HIGH (ref 70–99)
Glucose-Capillary: 163 mg/dL — ABNORMAL HIGH (ref 70–99)
Glucose-Capillary: 204 mg/dL — ABNORMAL HIGH (ref 70–99)
Glucose-Capillary: 87 mg/dL (ref 70–99)
Glucose-Capillary: 88 mg/dL (ref 70–99)
Glucose-Capillary: 96 mg/dL (ref 70–99)

## 2020-01-07 MED ORDER — MELATONIN 3 MG PO TABS
3.0000 mg | ORAL_TABLET | Freq: Every day | ORAL | Status: DC
  Administered 2020-01-07 – 2020-01-11 (×5): 3 mg via ORAL
  Filled 2020-01-07 (×6): qty 1

## 2020-01-07 MED ORDER — INSULIN GLARGINE 100 UNIT/ML ~~LOC~~ SOLN
25.0000 [IU] | Freq: Every day | SUBCUTANEOUS | Status: DC
  Administered 2020-01-08: 25 [IU] via SUBCUTANEOUS
  Filled 2020-01-07 (×2): qty 0.25

## 2020-01-07 NOTE — Progress Notes (Signed)
Pt continues to complain of headache.  PO pain meds did not help. Lost IV access to waiting on IV team to insert new PIV. Gave PO daily meds (bp, lipitor & senna) with miralax and patient vomited all of it up within 20 minutes. Will continue to monitor patient. Kendan Cornforth C 11:23 AM

## 2020-01-07 NOTE — Progress Notes (Signed)
Subjective:  O/N Events: paged due to tachypnea and increased O2 requirements. Coarse lung sounds bilaterally. Xray showing numerous bilateral cavitary lesions similar to prior CT with increased hazy lower lung opacity that could be superimposed infection vs inflammation. Low threshold to start antibiotics. O2 88%-92%.   Gary Hudson seen at bedside this morning. He is not feeling well. Headache is very bothersome. Had an episode of emesis 10-15 minutes ago. Voiding without difficulty. Has not had a BM in a couple of days.  Would like something to help him sleep.   Objective:  Vital signs in last 24 hours: Vitals:   01/07/20 0300 01/07/20 0400 01/07/20 0454 01/07/20 0500  BP: (!) 110/57 103/64  108/65  Pulse: 93 84  89  Resp: (!) 21 (!) 21  19  Temp:  98 F (36.7 C)    TempSrc:  Oral    SpO2: 91% (!) 88%  97%  Weight:   75.3 kg   Height:       Physical Exam Constitutional:      Appearance: He is ill-appearing.  Cardiovascular:     Rate and Rhythm: Normal rate and regular rhythm.     Pulses: No decreased pulses.     Heart sounds: No murmur. No gallop.   Pulmonary:     Effort: Pulmonary effort is normal.     Comments: Coarse crackles appreciated throughout the lung fields bilaterally Abdominal:     General: Bowel sounds are normal.     Palpations: Abdomen is soft.  Skin:    General: Skin is warm and dry.  Neurological:     Mental Status: He is alert. He is disoriented.  Psychiatric:        Mood and Affect: Mood normal.        Behavior: Behavior normal.    CBG (last 3)  Recent Labs    01/06/20 1947 01/06/20 2325 01/07/20 0351  GLUCAP 341* 178* 87    CBC Latest Ref Rng & Units 01/02/2020 12/31/2019 12/30/2019  WBC 4.0 - 10.5 K/uL 24.3(H) 26.4(H) 22.3(H)  Hemoglobin 13.0 - 17.0 g/dL 12.3(L) 13.7 13.8  Hematocrit 39.0 - 52.0 % 38.7(L) 43.1 43.0  Platelets 150 - 400 K/uL 633(H) 767(H) 871(H)   BMP Latest Ref Rng & Units 01/02/2020 12/31/2019 12/30/2019  Glucose 70 - 99  mg/dL 168(H) 150(H) 159(H)  BUN 6 - 20 mg/dL 17 20 21(H)  Creatinine 0.61 - 1.24 mg/dL 0.65 0.67 0.71  Sodium 135 - 145 mmol/L 131(L) 130(L) 131(L)  Potassium 3.5 - 5.1 mmol/L 4.5 4.3 5.0  Chloride 98 - 111 mmol/L 87(L) 89(L) 89(L)  CO2 22 - 32 mmol/L 33(H) 31 28  Calcium 8.9 - 10.3 mg/dL 9.2 9.1 9.6   Assessment/Plan:  Principal Problem:   Lung cancer metastatic to brain [facility]) Active Problems:   Uncontrolled type 2 diabetes mellitus with hyperglycemia (HCC)   Essential hypertension   Status post thoracotomy   Poor dentition   Cavitary lesion of lung   History of pleural effusion   Dyslipidemia   Cerebellar mass   Hypothyroid   Normocytic anemia   Unintentional weight loss   Hemoptysis   Dysphagia   Protein-calorie malnutrition, severe   Adenocarcinoma, metastatic (HCC)  Persistent Headaches Right Cerebellar Cystic Mass: Patient status post R suboccipital craniotomy - Neurosurgery consulted, we appreciate their recommendations.  - Surgical pathology: Collected  - Decadron Taper per neurosurgery.  - Continue dilaudid 1mg  Q2H. - Palliative care consulted, we appreciate their recommendations.   Cavitary Lung Disease  Infection vs Malignancy:  Patient underwent LLL biopsy 01/01/20. Labs currently pending.    - Surgical Pathology: squamous cell carcinoma is favored; however, the differential diagnosis includes adenosquamous carcinoma - Aerobic/Anaerobic: NGTD - Fungus Culture: Process - Acid Fast Smear: Negative - Acid Fast Culture: Process - Gram Stain: Rare WBC (PMN and mononuclear). No organisms seen.   Type II Diabetes - Uncontrolled A1c 13.5 on admission. Pt on glimepiride 4mg  daily and metformin 1000mg  BID at home. Patient placed on Decadron taper. - SSI resistant. - Lantus 25U - Novolog 15U with meals.   Hypertension: - Losartan 50mg  QD - HCZ 12.5mg  QD  Hypothyroidism - Levothyroxine 25mcg QD  Prior to Admission Living Arrangement: home Anticipated  Discharge Location: home Barriers to Discharge: continued medical work-up  Dispo: Anticipated discharge in approximately 3-4 day(s).   Maudie Mercury, MD 01/07/2020, 6:03 AM Pager: [phone]

## 2020-01-07 NOTE — Evaluation (Signed)
Physical Therapy Re-Evaluation Patient Details Name: Gary Hudson MRN: [id number] DOB: 03-14-1966 Today's Date: 01/07/2020   History of Present Illness  This is a 54 year old gentleman with a history of DM and previous empyema of the left lower lobe status post VATS decortication in June 2020.  He presents with several months of hemoptysis, headache and unintentional weight loss.  Was recently admitted to [facility] on 11/28/19 with these complaints and had prolonged hospital stay.  Tuberculosis was ruled out.  He had an EBUS which was negative for malignancy.  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  He was supposed to have a CT-guided biopsy but checked himself out AMA.  He presented 12/24/19 to the [facility] ED with worsening shortness of breath and headache.  Pt dx with persistent HA due to R cerebellar cystic mass s/p R occipital craniotomy 01/05/20.  Pt underwent LLL bx on 01/01/20 with pathology pending, suspicious for squamous cell carcinoma, but unconfirmed.    Clinical Impression  Pt was able to ambulate to the bathroom with min guard assist for safety and balance on 6 L O2 [place] (O2sats 88-90s), RR up to low 40s with DOE 2/4.  Pt vomited in the bathroom and I was able to assist him back to bed to rest.  RN aware of emesis episode.  BP and HR remained stable throughout.  Pt may need OP therapies to regain his balance depending on progress with therapy acutely.   PT to follow acutely for deficits listed below.    Follow Up Recommendations Outpatient PT;Supervision for mobility/OOB    Equipment Recommendations  None recommended by PT    Recommendations for Other Services   NA    Precautions / Restrictions Precautions Precautions: Fall Precaution Comments: mild gait instability      Mobility  Bed Mobility Overal bed mobility: Needs Assistance Bed Mobility: Supine to Sit;Sit to Supine     Supine to sit: Supervision;HOB elevated Sit to supine: Supervision;HOB  elevated   General bed mobility comments: Pt quick to transition into and out of bed, cues to move slower, make sure he is not going to fall off the bed.   Transfers Overall transfer level: Needs assistance Equipment used: None Transfers: Sit to/from Stand Sit to Stand: Min guard         General transfer comment: Min guard assist for safety, balance, and line management.   Ambulation/Gait Ambulation/Gait assistance: Min assist Gait Distance (Feet): 10 Feet Assistive device: 1 person hand held assist Gait Pattern/deviations: Step-through pattern;Staggering right;Drifts right/left     General Gait Details: Pt with mildly staggering gait pattern, moving too fast for safety.        Balance Overall balance assessment: Needs assistance Sitting-balance support: No upper extremity supported;Feet supported Sitting balance-Leahy Scale: Good     Standing balance support: No upper extremity supported Standing balance-Leahy Scale: Poor Standing balance comment: needs external support.                              Pertinent Vitals/Pain Pain Assessment: Faces Faces Pain Scale: Hurts whole lot Pain Location: head Pain Descriptors / Indicators: Headache Pain Intervention(s): Limited activity within patient's tolerance;Monitored during session;Repositioned    Home Living Family/patient expects to be discharged to:: Private residence Living Arrangements: Other (Comment)(nephew) Available Help at Discharge: Family Type of Home: House Home Access: Stairs to enter   [employer] of Steps: 1 Home Layout: One level  Home Equipment: None      Prior Function Level of Independence: Independent         Comments: Pt independent in all ADL, IADL, and mobility tasks. Pt does not ambulate with an assistive device and reports 0 falls in the last 6 months. 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      Hand Dominance   Dominant Hand: Right     Extremity/Trunk Assessment   Upper Extremity Assessment Upper Extremity Assessment: Defer to OT evaluation    Lower Extremity Assessment Lower Extremity Assessment: Generalized weakness    Cervical / Trunk Assessment Cervical / Trunk Assessment: Other exceptions Cervical / Trunk Exceptions: posterior cervical incision, forward head  Communication   Communication: No difficulties  Cognition Arousal/Alertness: Awake/alert Behavior During Therapy: Flat affect;Impulsive Overall Cognitive Status: Impaired/Different from baseline Area of Impairment: Safety/judgement;Awareness                         Safety/Judgement: Decreased awareness of safety Awareness: Emergent   General Comments: Pt is a bit fast to move, mildly unsteady on his fee, "I'm fine, I'm fine"      General Comments General comments (skin integrity, edema, etc.): Pt on 6 L O2 [place] (portable O2 tank goes from 4 to 6 and he was on the wall at 5 L) with O2 sats as low as 88% and RR increasing with audible DOE 2/3, RR low 40s.        Assessment/Plan    PT Assessment Patient needs continued PT services  PT Problem List Decreased strength;Decreased activity tolerance;Decreased mobility;Decreased balance;Decreased cognition;Decreased knowledge of use of DME;Decreased safety awareness;Pain       PT Treatment Interventions DME instruction;Gait training;Stair training;Functional mobility training;Therapeutic activities;Therapeutic exercise;Balance training;Neuromuscular re-education;Cognitive remediation;Patient/family education    PT Goals (Current goals can be found in the Care Plan section)  Acute Rehab PT Goals Patient Stated Goal: to decrease his HA PT Goal Formulation: With patient Time For Goal Achievement: 01/21/20 Potential to Achieve Goals: Good    Frequency Min 3X/week           AM-PAC PT "6 Clicks" Mobility  Outcome Measure Help needed turning from your back to your side while in a flat bed  without using bedrails?: None Help needed moving from lying on your back to sitting on the side of a flat bed without using bedrails?: None Help needed moving to and from a bed to a chair (including a wheelchair)?: A Little Help needed standing up from a chair using your arms (e.g., wheelchair or bedside chair)?: A Little Help needed to walk in hospital room?: A Little Help needed climbing 3-5 steps with a railing? : A Little 6 Click Score: 20    End of Session Equipment Utilized During Treatment: Oxygen Activity Tolerance: Other (comment)(limited by N/V) Patient left: in bed;with call bell/phone within reach;with bed alarm set Nurse Communication: Mobility status;Other (comment)(pt vomited in the bathroom) PT Visit Diagnosis: Muscle weakness (generalized) (M62.81);Difficulty in walking, not elsewhere classified (R26.2);Other symptoms and signs involving the nervous system (R29.898);Unsteadiness on feet (R26.81)    Time: 4163-8453 PT Time Calculation (min) (ACUTE ONLY): 35 min   Charges:         Verdene Lennert, PT, DPT  Acute Rehabilitation [phone] pager #[phone] office     PT Evaluation $PT Re-evaluation: 1 Re-eval PT Treatments $Therapeutic Activity: 8-22 mins       01/07/2020, 12:16 PM

## 2020-01-07 NOTE — Progress Notes (Signed)
Patient ID: Gary Hudson, male   DOB: [birth year], 54 y.o.   MRN: [id number] Still has headache.  Dressing is dry.  He is awake and alert and interactive.  Continue to mobilize.  CT scan reviewed.  No hydrocephalus but he did have pneumocephalus and that is probably the cause of his headache.  Continue Decadron.

## 2020-01-08 ENCOUNTER — Inpatient Hospital Stay (HOSPITAL_COMMUNITY): Payer: 59

## 2020-01-08 ENCOUNTER — Encounter: Payer: Self-pay | Admitting: *Deleted

## 2020-01-08 DIAGNOSIS — R531 Weakness: Secondary | ICD-10-CM

## 2020-01-08 DIAGNOSIS — R0602 Shortness of breath: Secondary | ICD-10-CM

## 2020-01-08 DIAGNOSIS — C3490 Malignant neoplasm of unspecified part of unspecified bronchus or lung: Secondary | ICD-10-CM

## 2020-01-08 DIAGNOSIS — Z66 Do not resuscitate: Secondary | ICD-10-CM

## 2020-01-08 DIAGNOSIS — Z515 Encounter for palliative care: Secondary | ICD-10-CM

## 2020-01-08 LAB — CBC
HCT: 33.4 % — ABNORMAL LOW (ref 39.0–52.0)
Hemoglobin: 10.7 g/dL — ABNORMAL LOW (ref 13.0–17.0)
MCH: 26.8 pg (ref 26.0–34.0)
MCHC: 32 g/dL (ref 30.0–36.0)
MCV: 83.5 fL (ref 80.0–100.0)
Platelets: 440 10*3/uL — ABNORMAL HIGH (ref 150–400)
RBC: 4 MIL/uL — ABNORMAL LOW (ref 4.22–5.81)
RDW: 15 % (ref 11.5–15.5)
WBC: 19.4 10*3/uL — ABNORMAL HIGH (ref 4.0–10.5)
nRBC: 0 % (ref 0.0–0.2)

## 2020-01-08 LAB — BASIC METABOLIC PANEL
Anion gap: 13 (ref 5–15)
BUN: 15 mg/dL (ref 6–20)
CO2: 25 mmol/L (ref 22–32)
Calcium: 8.7 mg/dL — ABNORMAL LOW (ref 8.9–10.3)
Chloride: 90 mmol/L — ABNORMAL LOW (ref 98–111)
Creatinine, Ser: 0.4 mg/dL — ABNORMAL LOW (ref 0.61–1.24)
GFR calc Af Amer: >60 mL/min (ref >60)
GFR calc non Af Amer: >60 mL/min (ref >60)
Glucose, Bld: 147 mg/dL — ABNORMAL HIGH (ref 70–99)
Potassium: 4.6 mmol/L (ref 3.5–5.1)
Sodium: 128 mmol/L — ABNORMAL LOW (ref 135–145)

## 2020-01-08 LAB — GLUCOSE, CAPILLARY
Glucose-Capillary: 112 mg/dL — ABNORMAL HIGH (ref 70–99)
Glucose-Capillary: 150 mg/dL — ABNORMAL HIGH (ref 70–99)
Glucose-Capillary: 159 mg/dL — ABNORMAL HIGH (ref 70–99)
Glucose-Capillary: 165 mg/dL — ABNORMAL HIGH (ref 70–99)
Glucose-Capillary: 184 mg/dL — ABNORMAL HIGH (ref 70–99)
Glucose-Capillary: 268 mg/dL — ABNORMAL HIGH (ref 70–99)
Glucose-Capillary: 51 mg/dL — ABNORMAL LOW (ref 70–99)

## 2020-01-08 MED ORDER — VANCOMYCIN HCL IN DEXTROSE 1-5 GM/200ML-% IV SOLN
1000.0000 mg | Freq: Three times a day (TID) | INTRAVENOUS | Status: DC
  Administered 2020-01-08 – 2020-01-12 (×12): 1000 mg via INTRAVENOUS
  Filled 2020-01-08 (×12): qty 200

## 2020-01-08 MED ORDER — VANCOMYCIN HCL 1500 MG/300ML IV SOLN
1500.0000 mg | Freq: Once | INTRAVENOUS | Status: AC
  Administered 2020-01-08: 1500 mg via INTRAVENOUS
  Filled 2020-01-08: qty 300

## 2020-01-08 MED ORDER — HEPARIN SODIUM (PORCINE) 5000 UNIT/ML IJ SOLN
5000.0000 [IU] | Freq: Three times a day (TID) | INTRAMUSCULAR | Status: DC
  Administered 2020-01-08 – 2020-01-12 (×11): 5000 [IU] via SUBCUTANEOUS
  Filled 2020-01-08 (×12): qty 1

## 2020-01-08 MED ORDER — SODIUM CHLORIDE 0.9 % IV SOLN
2.0000 g | INTRAVENOUS | Status: DC
  Filled 2020-01-08: qty 20

## 2020-01-08 MED ORDER — PIPERACILLIN-TAZOBACTAM 3.375 G IVPB
3.3750 g | Freq: Three times a day (TID) | INTRAVENOUS | Status: DC
  Administered 2020-01-08 – 2020-01-12 (×12): 3.375 g via INTRAVENOUS
  Filled 2020-01-08 (×11): qty 50

## 2020-01-08 MED ORDER — DEXTROSE 50 % IV SOLN
INTRAVENOUS | Status: AC
  Filled 2020-01-08: qty 50

## 2020-01-08 NOTE — Progress Notes (Addendum)
Pharmacy Antibiotic Note  Gary Hudson is a 54 y.o. male admitted on 12/24/2019 with headaches.  He has a history of left empyema with cavitary lesion s/p VATS in July 2020.  He is also s/p bronch, EBUS, lung biopsy and craniotomy for cerebellar cystic mass.  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  Pharmacy has been consulted for vancomycin dosing.  Patient is also on Rocephin.  SCr was 0.65 on 01/02/20.  Afebrile, WBC elevated at 24.3.  Plan: Vanc 1500mg  IV x 1, then 1gm IV Q8H for trough 15-20 mcg/mL Rocephin 2gm IV Q24H Monitor renal fxn, clinical progress, vanc trough as indicated Check BMET and adjust vancomycin if needed  Height: 5' 10.98" (180.3 cm) Weight: 75.3 kg (166 lb 0.1 oz) IBW/kg (Calculated) : 75.26  Temp (24hrs), Avg:97.9 F (36.6 C), Min:97.5 F (36.4 C), Max:98.4 F (36.9 C)  Recent Labs  Lab 01/02/20 0227 01/08/20 0738  WBC 24.3* 19.4*  CREATININE 0.65  --     Estimated Creatinine Clearance: 111.1 mL/min (by C-G formula based on SCr of 0.65 mg/dL).    No Known Allergies  Vanc 5/17 >> CTX 5/17 >>  5/2 covid / flu - negative 5/4 AFB -  5/10 LLL lung abscess - Gemella morbillorum   Damarrion Mimbs D. Mina Marble, PharmD, BCPS, [place] 01/08/2020, 8:28 AM   ================================  Addendum: Change Rocephin to Zosyn Zosyn EID 3.375gm IV Q8H  Cayman Kielbasa D. Mina Marble, PharmD, BCPS, [place] 01/08/2020, 10:36 AM

## 2020-01-08 NOTE — Progress Notes (Addendum)
HEMATOLOGY-ONCOLOGY PROGRESS NOTE  SUBJECTIVE: Reports ongoing headaches.  Requesting pain medication.  Reports shortness of breath.  No drop in O2 sats early this morning requiring increased O2.  Prior from 01/06/2020 showed numerous bilateral cavitary lesions and increased hazy left lower lung opacity concerning for infection versus inflammation.  He has had some intermittent vomiting and so there is concern for aspiration pneumonia.  He has been started on IV antibiotics.  He is also being worked up for DVT.  REVIEW OF SYSTEMS:   Constitutional: Denies fevers, chills Respiratory: Reports increasing shortness of breath Cardiovascular: Denies palpitation, chest discomfort Gastrointestinal: Has been having intermittent vomiting Skin: Denies abnormal skin rashes Lymphatics: Denies new lymphadenopathy or easy bruising Neurological: Reports headaches Behavioral/Psych: Mood is stable, no new changes  Extremities: No lower extremity edema All other systems were reviewed with the patient and are negative.  I have reviewed the past medical history, past surgical history, social history and family history with the patient and they are unchanged from previous note.   PHYSICAL EXAMINATION: ECOG PERFORMANCE STATUS: 2 - Symptomatic, <50% confined to bed  Vitals:   01/08/20 0732 01/08/20 1232  BP: (!) 112/54 100/62  Pulse: 79 89  Resp: 20 (!) 22  Temp: 97.8 F (36.6 C) 97.6 F (36.4 C)  SpO2: 99% 96%   Filed Weights   01/05/20 1623 01/06/20 0500 01/07/20 0454  Weight: 70.5 kg 76.1 kg 75.3 kg    Intake/Output from previous day: 05/16 0701 - 05/17 0700 In: 680 [P.O.:240; I.V.:440] Out: 1275 [Urine:1275]  GENERAL:alert, no distress and comfortable LUNGS: Rhonchi in the anterior lung fields HEART: regular rate & rhythm and no murmurs and no lower extremity edema ABDOMEN:abdomen soft, non-tender and normal bowel sounds Musculoskeletal:no cyanosis of digits and no clubbing  NEURO: alert &  oriented x 3 with fluent speech, no focal motor/sensory deficits  LABORATORY DATA:  I have reviewed the data as listed CMP Latest Ref Rng & Units 01/08/2020 01/02/2020 12/31/2019  Glucose 70 - 99 mg/dL 147(H) 168(H) 150(H)  BUN 6 - 20 mg/dL 15 17 20   Creatinine 0.61 - 1.24 mg/dL 0.40(L) 0.65 0.67  Sodium 135 - 145 mmol/L 128(L) 131(L) 130(L)  Potassium 3.5 - 5.1 mmol/L 4.6 4.5 4.3  Chloride 98 - 111 mmol/L 90(L) 87(L) 89(L)  CO2 22 - 32 mmol/L 25 33(H) 31  Calcium 8.9 - 10.3 mg/dL 8.7(L) 9.2 9.1  Total Protein 6.5 - 8.1 g/dL - - -  Total Bilirubin 0.3 - 1.2 mg/dL - - -  Alkaline Phos 38 - 126 U/L - - -  AST 15 - 41 U/L - - -  ALT 0 - 44 U/L - - -    Lab Results  Component Value Date   WBC 19.4 (H) 01/08/2020   HGB 10.7 (L) 01/08/2020   HCT 33.4 (L) 01/08/2020   MCV 83.5 01/08/2020   PLT 440 (H) 01/08/2020   NEUTROABS 8.7 (H) 12/24/2019    DG Chest 2 View  Result Date: 12/24/2019 CLINICAL DATA:  Shortness of breath EXAM: CHEST - 2 VIEW COMPARISON:  Chest radiographs and CT, 11/27/2019 FINDINGS: The heart size and mediastinal contours are within normal limits. Redemonstrated heterogeneous opacity and multiple cavitary lesions of the left lower lobe. The visualized skeletal structures are unremarkable. IMPRESSION: Redemonstrated heterogeneous opacity and multiple cavitary lesions of the left lower lobe, better assessed by prior CT. No new airspace opacity. Electronically Signed   By: Eddie Candle M.D.   On: 12/24/2019 16:03   CT HEAD  WO CONTRAST  Result Date: 01/06/2020 CLINICAL DATA:  Tumor resection yesterday.  Follow-up. EXAM: CT HEAD WITHOUT CONTRAST TECHNIQUE: Contiguous axial images were obtained from the base of the skull through the vertex without intravenous contrast. COMPARISON:  MRI 12/28/2019.  CT 12/24/2019. FINDINGS: Brain: Interval right suboccipital craniectomy for right cerebellar tumor resection. Fluid and air present within the region of the previously seen inferior  right cerebellar mass. This area appears smaller, measuring 3.2 x 2.5 cm compared with 4.2 x 3.3 cm preoperatively. Less mass effect upon the fourth ventricle. No sign of hemorrhagic complication. Subarachnoid air present in scattered locations throughout the intracranial compartments. No intraventricular air. No hemorrhage, hydrocephalus or extra-axial collection elsewhere. Vascular: No abnormal vascular finding. Skull: Right suboccipital craniectomy as noted above. 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 Fluid and air in the operative bed measure 3.2 x 2.5 cm compared with the preoperative size of the lesion measuring 4.2 x 3.3 cm. Less mass effect. No evidence of hemorrhagic complication. Scattered subarachnoid air throughout the intracranial compartments. Electronically Signed   By: Nelson Chimes M.D.   On: 01/06/2020 10:19   CT Head Wo Contrast  Result Date: 12/24/2019 CLINICAL DATA:  Headache. Cerebellar mass EXAM: CT HEAD WITHOUT CONTRAST TECHNIQUE: Contiguous axial images were obtained from the base of the skull through the vertex without intravenous contrast. COMPARISON:  None. FINDINGS: Brain: Hypodense mass of the right cerebellar hemisphere measures 4.2 x 3.3 cm. There is mass effect on the fourth ventricle and distal cerebral aqueduct. No hydrocephalus. No hemorrhage or extra-axial collection. Vascular: No hyperdense vessel or unexpected calcification. Skull: Normal Sinuses/Orbits: Clear sinuses and mastoids. Normal orbits. Other: None. IMPRESSION: Hypodense mass of the right cerebellar hemisphere with mass effect on the fourth ventricle and distal cerebral aqueduct. No hydrocephalus or hemorrhage. Electronically Signed   By: Ulyses Jarred M.D.   On: 12/24/2019 20:03   CT Angio Chest PE W and/or Wo Contrast  Result Date: 12/24/2019 CLINICAL DATA:  Shortness of breath. EXAM: CT ANGIOGRAPHY CHEST WITH CONTRAST  TECHNIQUE: Multidetector CT imaging of the chest was performed using the standard protocol during bolus administration of intravenous contrast. Multiplanar CT image reconstructions and MIPs were obtained to evaluate the vascular anatomy. CONTRAST:  165mL OMNIPAQUE IOHEXOL 350 MG/ML SOLN COMPARISON:  Radiograph earlier this day. Chest CTA 1 month ago 11/27/2019 at [place]: Cardiovascular: There are no filling defects within the pulmonary arteries to suggest pulmonary embolus. Aortic atherosclerosis. No aneurysm. Heart is normal in size. No pericardial effusion. There are coronary artery calcifications. Mediastinum/Nodes: Shotty mediastinal and hilar adenopathy, with slight progression from last month. For example right hilar node measures 14 mm, previously 12 mm. No esophageal wall thickening. No thyroid nodule. Lungs/Pleura: Multifocal cavitary lesions throughout both lungs, again seen. Dominant left lower lobe cavitary process in the infrahilar region measures 6 x 3.6 cm, previously 7.6 x 4.8 cm. This abuts the left lower lobe bronchus which is appears occluded. Adjacent peripheral cavitary nodules and consolidation in the left lower lobe. There is a new dominant cavitary process in the left upper lobe measuring 2.9 x 3.5 cm, new from last month. Surrounding consolidation and ground-glass. Small cavitary nodules are present throughout all lobes of both lungs, otherwise grossly stable from prior. Small left pleural effusion. There is central bronchial thickening diffusely. Upper Abdomen: Ingested material distends the stomach. No acute findings. Musculoskeletal: Sclerosis centered at T7-T8 endplate consistent with Modic endplate changes, also seen on thoracic  MRI performed 12/01/2019 at an outside institution. No discrete focal bone lesion. Review of the MIP images confirms the above findings. IMPRESSION: 1. No pulmonary embolus. 2. Known cavitary process throughout the lungs, with dominant left  lower lobe cavitary lesion, similar to CT last month. Patient recently underwent bronchoscopy 11/30/2019 at an outside institution. Recommend correlation with prior bronchoscopy results. Imaging findings may be infectious or neoplastic. 3. Additional cavitary lesions throughout both lungs, also seen on prior, majority of which are stable. There is however a new/progressive left upper lobe cavitary lesion that measures 3.5 x 2.9 cm with surrounding consolidation. 4. Slight progression in mediastinal and hilar adenopathy. Electronically Signed   By: Keith Rake M.D.   On: 12/24/2019 19:46   CT guided needle placement  Result Date: 01/01/2020 INDICATION: Left lower lobe cavitary mass EXAM: CT-GUIDED LEFT LOWER LOBE CAVITARY MASS CORE BIOPSY MEDICATIONS: 1% lidocaine local ANESTHESIA/SEDATION: Moderate (conscious) sedation was employed during this procedure. A total of Versed 1.5 mg and Fentanyl 75 mcg was administered intravenously. Moderate Sedation Time: 13 minutes the patient's level of consciousness and vital signs were monitored continuously by radiology nursing throughout the procedure under my direct supervision. FLUOROSCOPY TIME:  Fluoroscopy Time: None. COMPLICATIONS: None immediate. PROCEDURE: Informed written consent was obtained from the patient after a thorough discussion of the procedural risks, benefits and alternatives. All questions were addressed. Maximal Sterile Barrier Technique was utilized including caps, mask, sterile gowns, sterile gloves, sterile drape, hand hygiene and skin antiseptic. A timeout was performed prior to the initiation of the procedure. Previous imaging reviewed. Patient position prone. Noncontrast localization CT performed. The medial left lower lobe cavitary mass was localized and marked for a posterior paraspinous approach. Under sterile conditions and local anesthesia, a 17 gauge 6.8 cm guide needle was advanced to the medial cavitary lesion margin. Needle position  confirmed with CT. 18 gauge core biopsies obtained and placed in saline. Sample sent for pathology and pan culture. Needle tract occluded with the bio sentry device. Postprocedure imaging demonstrates no hemorrhage or hematoma. Patient tolerated biopsy well. IMPRESSION: Successful CT-guided core biopsy of the left lower lobe cavitary mass Electronically Signed   By: Jerilynn Mages.  Shick M.D.   On: 01/01/2020 09:51   MR BRAIN W WO CONTRAST  Result Date: 12/28/2019 CLINICAL DATA:  Cerebellar mass. EXAM: MRI HEAD WITHOUT AND WITH CONTRAST TECHNIQUE: Multiplanar, multiecho pulse sequences of the brain and surrounding structures were obtained without and with intravenous contrast. CONTRAST:  7.44mL GADAVIST GADOBUTROL 1 MMOL/ML IV SOLN COMPARISON:  None. FINDINGS: Brain: Large cystic appearing mass lesion is seen centered on the right cerebellar hemisphere with low signal on T1 and increased signal T2 with layering debris and then rim enhancement. Restricted diffusion is noted along the lesion margins with facilitated diffusion within the lesion. It measures approximately 4.5 x 3.9 x 3.2 cm (T, AP, cc) and causes mass effect on the fourth ventricle, pons and medulla. No herniation through the foramen magnum. No hydrocephalus or hemorrhage. No other lesion identified. Vascular: Normal flow voids. Skull and upper cervical spine: Normal marrow signal. Sinuses/Orbits: Mucous retention cyst in the left frontal sinus and mucosal thickening of the left ethmoid cells. Orbits are maintained. Other: None. IMPRESSION: Large cystic appearing mass centered on the right cerebellar hemisphere measuring approximately 4.5 x 3.9 x 3.2 cm with layering debris and thin rim enhancement. Given absence of intralesional restricted diffusion, findings favor metastasis over abscess. No hydrocephalus. Electronically Signed   By: Sandre Kitty.D.  On: 12/28/2019 15:32   MR PELVIS W WO CONTRAST  Result Date: 12/30/2019 CLINICAL DATA:   Cavitary LEFT upper lobe pulmonary lesion. Posterior fossa brain lesion. EXAM: MRI ABDOMEN AND PELVIS WITHOUT AND WITH CONTRAST TECHNIQUE: Multiplanar multisequence MR imaging of the abdomen and pelvis was performed both before and after the administration of intravenous contrast. CONTRAST:  7.56mL GADAVIST GADOBUTROL 1 MMOL/ML IV SOLN COMPARISON:  CT chest 12/24/2019, brain MRI 12/28/2019 FINDINGS: COMBINED FINDINGS FOR BOTH MR ABDOMEN AND PELVIS Lower chest: Large cavitary mass again noted in the LEFT lower lobe. Hepatobiliary: No enhancing hepatic lesion. Gallbladder normal. No biliary duct dilatation. Pancreas: Pancreas is normal. No ductal dilatation. No pancreatic inflammation. Spleen: Normal spleen Adrenals/urinary tract: Adrenal glands are normal. Cystic lesion in the cortex of the RIGHT kidney measures 1.5 cm (image 24/series 5). Cystic lesion has mild complexity on the T2 weighted imaging however has no post-contrast enhancement by direct intensity measurement (series 15, series 17) as well as by a subtraction imaging (series 18) Stomach/Bowel: Stomach and duodenum are normal. Small bowel normal. Moderate volume stool throughout the colon. No obstructing lesion identified. Vascular/Lymphatic: Abdominal aorta is normal caliber. No periportal or retroperitoneal adenopathy. No pelvic adenopathy. Reproductive: Prostate normal. Other: No free fluid. Musculoskeletal: No aggressive osseous lesion identified. IMPRESSION: 1. No evidence of malignancy in the abdomen pelvis by MRI imaging. 2. No evidence of bowel lesion. There is some limitation in bowel evaluation with MRI. 3. Normal adrenal glands. 4. 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 Again demonstrated, large cavitary process in the LEFT lower lobe. Electronically Signed   By: Suzy Bouchard M.D.   On: 12/30/2019 09:57   MR ABDOMEN W WO CONTRAST  Result Date: 12/30/2019 CLINICAL DATA:  Cavitary LEFT upper lobe pulmonary lesion. Posterior  fossa brain lesion. EXAM: MRI ABDOMEN AND PELVIS WITHOUT AND WITH CONTRAST TECHNIQUE: Multiplanar multisequence MR imaging of the abdomen and pelvis was performed both before and after the administration of intravenous contrast. CONTRAST:  7.42mL GADAVIST GADOBUTROL 1 MMOL/ML IV SOLN COMPARISON:  CT chest 12/24/2019, brain MRI 12/28/2019 FINDINGS: COMBINED FINDINGS FOR BOTH MR ABDOMEN AND PELVIS Lower chest: Large cavitary mass again noted in the LEFT lower lobe. Hepatobiliary: No enhancing hepatic lesion. Gallbladder normal. No biliary duct dilatation. Pancreas: Pancreas is normal. No ductal dilatation. No pancreatic inflammation. Spleen: Normal spleen Adrenals/urinary tract: Adrenal glands are normal. Cystic lesion in the cortex of the RIGHT kidney measures 1.5 cm (image 24/series 5). Cystic lesion has mild complexity on the T2 weighted imaging however has no post-contrast enhancement by direct intensity measurement (series 15, series 17) as well as by a subtraction imaging (series 18) Stomach/Bowel: Stomach and duodenum are normal. Small bowel normal. Moderate volume stool throughout the colon. No obstructing lesion identified. Vascular/Lymphatic: Abdominal aorta is normal caliber. No periportal or retroperitoneal adenopathy. No pelvic adenopathy. Reproductive: Prostate normal. Other: No free fluid. Musculoskeletal: No aggressive osseous lesion identified. IMPRESSION: 1. No evidence of malignancy in the abdomen pelvis by MRI imaging. 2. No evidence of bowel lesion. There is some limitation in bowel evaluation with MRI. 3. Normal adrenal glands. 4. 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 Again demonstrated, large cavitary process in the LEFT lower lobe. Electronically Signed   By: Suzy Bouchard M.D.   On: 12/30/2019 09:57   DG CHEST PORT 1 VIEW  Result Date: 01/06/2020 CLINICAL DATA:  Shortness of breath history of lung cancer with Mets EXAM: PORTABLE CHEST 1 VIEW COMPARISON:  12/24/2019,  03/01/2019  CT 12/24/2019 FINDINGS: Right lung shows no focal consolidation. Numerous cavitary lesions bilaterally with cavitary mass in the left lower lobe. Slight increased hazy opacity in the left lower lung. Stable cardiomediastinal silhouette. No pneumothorax. IMPRESSION: Numerous bilateral cavitary lesions as demonstrated on prior CT with dominant left lower lobe cavitary mass lesion. Increased hazy left lower lung opacity may reflect superimposed infectious or inflammatory process. Electronically Signed   By: Donavan Foil M.D.   On: 01/06/2020 22:10   DG Swallowing Func-Speech Pathology  Result Date: 12/27/2019 Objective Swallowing Evaluation: Type of Study: MBS-Modified Barium Swallow Study  Patient Details Name: AGUSTINE ROSSITTO MRN: [id number] Date of Birth: 12/09/1965 Today's Date: 12/27/2019 Time: SLP Start Time (ACUTE ONLY): 0910 -SLP Stop Time (ACUTE ONLY): 0930 SLP Time Calculation (min) (ACUTE ONLY): 20 min Past Medical History: Past Medical History: Diagnosis Date . Diabetes mellitus without complication [facility])  Past Surgical History: Past Surgical History: Procedure Laterality Date . BRONCHIAL BRUSHINGS  12/26/2019  Procedure: BRONCHIAL BRUSHINGS;  Surgeon: Spero Geralds, MD;  Location: [place];  Service: Pulmonary;; . BRONCHIAL WASHINGS  12/26/2019  Procedure: BRONCHIAL WASHINGS;  Surgeon: Spero Geralds, MD;  Location: [facility] ENDOSCOPY;  Service: Pulmonary;; . TEE WITHOUT CARDIOVERSION N/A 01/26/2019  Procedure: TRANSESOPHAGEAL ECHOCARDIOGRAM (TEE);  Surgeon: Prescott Gum, Collier Salina, MD;  Location: [facility] OR;  Service: Thoracic;  Laterality: N/A; . VIDEO ASSISTED THORACOSCOPY (VATS)/EMPYEMA Left 01/26/2019  Procedure: VIDEO ASSISTED THORACOSCOPY (VATS)/DRAINAGE OF EMPYEMA  AND DECORTICATION;  Surgeon: Ivin Poot, MD;  Location: [place];  Service: Thoracic;  Laterality: Left; Marland Kitchen VIDEO BRONCHOSCOPY N/A 01/26/2019  Procedure: VIDEO BRONCHOSCOPY;  Surgeon: Prescott Gum, Collier Salina, MD;  Location: [place];  Service: Thoracic;   Laterality: N/A; . VIDEO BRONCHOSCOPY N/A 12/26/2019  Procedure: VIDEO BRONCHOSCOPY WITHOUT FLUORO;  Surgeon: Spero Geralds, MD;  Location[facility] ENDOSCOPY;  Service: Pulmonary;  Laterality: N/A; HPI: Mr. Ormiston is a 54 year old M with significant PMH of loculated left empyema with cavitary lung lesion in the left lower lobe s/p VATS in 01/2019, significant tobacco use, type II diabetes, hypertension, and hypothyroidism. He presents to the ED today for intractable headaches. Of note, he had a recent hospitalization 1 month ago after presenting for hemoptysis and weight loss and was found to have enlargement of his prior cavitary lung lesions with numerous bilateral smaller lesions and right cerebellar cystic mass. 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 Pt denies ever having seen ENT.  Subjective: Pt upright in chair; cooperative for test. Assessment / Plan / Recommendation CHL IP CLINICAL IMPRESSIONS 12/27/2019 Clinical Impression Mr. Mcbryar was seen under videofluoroscopy with thin liquids via spoon/cup/straw, nectar thick liquids via cup, honey thick liquids via cup, purees, and regular solids. Pt demonstrates a moderate pharyngeal dysphagia. Deficits include reduced hyoid excursion, laryngeal elevation, and subsequent epiglottic inversion and UES opening. These deficits resulted in moderate amounts of residue t/o pharynx, specifically with thickened liquids, purees and solids in the valleculae and pyriform sinus. Residue was noted to penetrate and remain in trace amounts d/t UES squeeze pushing residue into laryngeal vestibule. Penetration also occurred before the swallow with thin, nectar, and honey thick liquids. Aspiration in trace amounts was noted with both nectar thick and thin and pt demonstrated no efforts to clear (reduced sensation). Given use of strategies (chin tuck, throat clear) pt was able to  effectively maintain clear airway with thin liquids. Pt's cognition is adequate for  use of strategies, however, he does have a long history of non-compliance. Pt would likely benefit from further education on need and use of strategies, with family present if able. Recommendations: thin liquids with chin tuck (throat clear after the swallow), regular solids, small bites/sips, alternate liquids/solids to clear residue, follow up with ENT regarding vocal fold paralysis SLP Visit Diagnosis Dysphagia, pharyngeal phase (R13.13) Impact on safety and function Moderate aspiration risk   CHL IP TREATMENT RECOMMENDATION 12/27/2019 Treatment Recommendations Therapy as outlined in treatment plan below   Prognosis 12/27/2019 Prognosis for Safe Diet Advancement Guarded Barriers to Reach Goals Other (Comment) Barriers/Prognosis Comment Guarded based on h/o non-compliance and medical prognosis given cerebellar mass CHL IP DIET RECOMMENDATION 12/27/2019 SLP Diet Recommendations Regular solids;Thin liquid Liquid Administration via Straw;Cup Medication Administration Whole meds with puree Compensations Small sips/bites;Chin tuck;Clear throat after each swallow Postural Changes Chin tuck in upright position   CHL IP OTHER RECOMMENDATIONS 12/27/2019 Recommended Consults Consider ENT evaluation Oral Care Recommendations Oral care BID Other Recommendations    CHL IP FOLLOW UP RECOMMENDATIONS 12/27/2019 Follow up Recommendations Outpatient SLP;Home health SLP   CHL IP FREQUENCY AND DURATION 12/27/2019 Speech Therapy Frequency (ACUTE ONLY) min 1 x/week Treatment Duration 2 weeks      CHL IP ORAL PHASE 12/27/2019 Oral Phase WFL Oral - Honey Cup WFL Oral - Nectar Cup WFL Oral - Thin Teaspoon WFL Oral - Thin Cup WFL Oral - Thin Straw WFL Oral - Puree WFL Oral - Regular WFL Oral Phase - Comment Poor dentition, not impacting mastication  CHL IP PHARYNGEAL PHASE 12/27/2019 Pharyngeal Phase Impaired Pharyngeal- Honey Cup Penetration/Apiration after  swallow;Pharyngeal residue - cp segment;Pharyngeal residue - pyriform;Pharyngeal residue - valleculae Pharyngeal- Nectar Cup Trace aspiration;Reduced epiglottic inversion;Reduced laryngeal elevation;Reduced airway/laryngeal closure Pharyngeal- Thin Teaspoon Reduced anterior laryngeal mobility;Reduced epiglottic inversion;Reduced airway/laryngeal closure;Reduced laryngeal elevation;Pharyngeal residue - valleculae;Pharyngeal residue - pyriform;Pharyngeal residue - cp segment Pharyngeal- Thin Cup Trace aspiration;Penetration/Apiration after swallow;Penetration/Aspiration before swallow;Reduced epiglottic inversion;Reduced anterior laryngeal mobility;Reduced laryngeal elevation;Reduced airway/laryngeal closure Pharyngeal- Thin Straw Reduced epiglottic inversion;Reduced anterior laryngeal mobility;Penetration/Apiration after swallow;Penetration/Aspiration before swallow;Reduced airway/laryngeal closure;Reduced laryngeal elevation Pharyngeal- Puree Pharyngeal residue - valleculae;Pharyngeal residue - pyriform;Pharyngeal residue - cp segment Pharyngeal- Regular Pharyngeal residue - pyriform;Pharyngeal residue - valleculae;Pharyngeal residue - posterior pharnyx;Reduced epiglottic inversion;Reduced pharyngeal peristalsis;Reduced laryngeal elevation;Reduced anterior laryngeal mobility  CHL IP CERVICAL ESOPHAGEAL PHASE 12/27/2019 Cervical Esophageal Phase Impaired Honey Cup Reduced cricopharyngeal relaxation Nectar Cup Reduced cricopharyngeal relaxation Thin Cup Reduced cricopharyngeal relaxation Thin Straw Reduced cricopharyngeal relaxation Puree Reduced cricopharyngeal relaxation Regular Reduced cricopharyngeal relaxation Madison P. Isenhour, M.S., CCC-SLP Speech-Language Pathologist Acute Rehabilitation Services Pager: [place] 12/27/2019, 9:47 AM              ECHOCARDIOGRAM COMPLETE  Result Date: 12/26/2019    ECHOCARDIOGRAM REPORT   Patient Name:   Gary Hudson Date of Exam: 12/26/2019 Medical Rec  #:  [id number]       Height:       71.0 in Accession #:    [id number]      Weight:       164.0 lb Date of Birth:  08/11/1966       BSA:          1.938 m Patient Age:    38 years        BP:           125/89 mmHg Patient Gender: M               HR:  72 bpm. Exam Location:  Inpatient Procedure: 2D Echo Indications:    lung abnormality  History:        Patient has prior history of Echocardiogram examinations, most                 recent 01/26/2019. Risk Factors:Diabetes and Former Smoker.  Sonographer:    [place] Celestine RDCS (AE) Referring Phys: [id number] CAROLYN GUILLOUD IMPRESSIONS  1. Left ventricular ejection fraction, by estimation, is 60 to 65%. The left ventricle has normal function. The left ventricle has no regional wall motion abnormalities. Left ventricular diastolic parameters were normal.  2. Right ventricular systolic function is normal. The right ventricular size is mildly enlarged.  3. Left atrial size was mildly dilated.  4. Right atrial size was mildly dilated.  5. The mitral valve is normal in structure. Trivial mitral valve regurgitation. No evidence of mitral stenosis.  6. The aortic valve has an indeterminant number of cusps. Aortic valve regurgitation is not visualized. No aortic stenosis is present.  7. The inferior vena cava is normal in size with <50% respiratory variability, suggesting right atrial pressure of 8 mmHg. FINDINGS  Left Ventricle: Left ventricular ejection fraction, by estimation, is 60 to 65%. The left ventricle has normal function. The left ventricle has no regional wall motion abnormalities. The left ventricular internal cavity size was normal in size. There is  no left ventricular hypertrophy. Left ventricular diastolic parameters were normal. Right Ventricle: The right ventricular size is mildly enlarged. No increase in right ventricular wall thickness. Right ventricular systolic function is normal. Left Atrium: Left atrial size was mildly dilated. Right Atrium: Right  atrial size was mildly dilated. Pericardium: Trivial pericardial effusion is present. Mitral Valve: The mitral valve is normal in structure. Trivial mitral valve regurgitation. No evidence of mitral valve stenosis. Tricuspid Valve: The tricuspid valve is normal in structure. Tricuspid valve regurgitation is trivial. No evidence of tricuspid stenosis. Aortic Valve: The aortic valve has an indeterminant number of cusps. Aortic valve regurgitation is not visualized. No aortic stenosis is present. There is mild calcification of the aortic valve. Pulmonic Valve: The pulmonic valve was not well visualized. Pulmonic valve regurgitation is not visualized. No evidence of pulmonic stenosis. Aorta: The aortic arch was not well visualized, the ascending aorta was not well visualized and the aortic root was not well visualized. Pulmonary Artery: The pulmonary artery is not well seen. Venous: The inferior vena cava is normal in size with less than 50% respiratory variability, suggesting right atrial pressure of 8 mmHg. IAS/Shunts: No atrial level shunt detected by color flow Doppler.  LEFT VENTRICLE PLAX 2D LVIDd:         4.50 cm  Diastology LVIDs:         2.90 cm  LV e' lateral:   10.30 cm/s LV PW:         1.00 cm  LV E/e' lateral: 9.6 LV IVS:        1.10 cm  LV e' medial:    7.07 cm/s LVOT diam:     2.10 cm  LV E/e' medial:  14.0 LV SV:         63 LV SV Index:   33 LVOT Area:     3.46 cm  RIGHT VENTRICLE RV S prime:     12.20 cm/s TAPSE (M-mode): 2.0 cm LEFT ATRIUM             Index       RIGHT ATRIUM  Index LA diam:        3.90 cm 2.01 cm/m  RA Area:     20.90 cm LA Vol (A2C):   61.0 ml 31.47 ml/m RA Volume:   57.70 ml  29.77 ml/m LA Vol (A4C):   71.8 ml 37.05 ml/m LA Biplane Vol: 69.7 ml 35.96 ml/m  AORTIC VALVE LVOT Vmax:   80.20 cm/s LVOT Vmean:  51.900 cm/s LVOT VTI:    0.182 m  AORTA Ao Root diam: 3.30 cm MITRAL VALVE MV Area (PHT): 3.72 cm    SHUNTS MV Decel Time: 204 msec    Systemic VTI:  0.18 m MV E  velocity: 99.20 cm/s  Systemic Diam: 2.10 cm MV A velocity: 60.00 cm/s MV E/A ratio:  1.65 Buford Dresser MD Electronically signed by Buford Dresser MD Signature Date/Time: 12/26/2019/5:17:17 PM    Final     ASSESSMENT AND PLAN: 1.  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  Persistent headache secondary to #2 4.  Hypoxia due to possible pneumonia or possible clot 5.  COPD 6.  Type 2 diabetes 7.  Hypertension 8.  Hypothyroidism  -Biopsy from lung lesion consistent with poorly differentiated carcinoma.  Awaiting pathology from cerebellar lesion.  Plan to send for molecular analysis.  Suspect the brain mass may be metastatic disease from the lung.  Once we have all this information, we will determine the treatment plan. -Continue dexamethasone and pain medications for headaches. -Has been started on IV antibiotics for possible aspiration pneumonia.  Awaiting Doppler ultrasound to evaluate for DVT.  Would also consider CT angiogram of the chest to evaluate for PE.   LOS: 15 days   Mikey Bussing, DNP, AGPCNP-BC, AOCNP 01/08/20   ADDENDUM: I agree with the above assessment.  We know that he has a long cancer.  This is poorly differentiated.  Maybe, we can get whether this is adenocarcinoma or squamous cell carcinoma.  I would have to believe that this would be a squamous cell carcinoma given that this is cavitary.  We will try to send off molecular studies on the specimen from the resection from the brain.  Hopefully there will be tissue that we can use for molecular studies.  This will be important as this will guide therapy.  He had Dopplers done of his legs.  These were negative for any thromboembolic disease.  I realize that he has a tough problem.  He is really not that old.  Hopefully we will be able to offer systemic therapy.  It would be nice if we could  utilize immunotherapy or targeted therapy depending on what the molecular studies show.  Lattie Haw, MD  Oswaldo Milian 61:3

## 2020-01-08 NOTE — Progress Notes (Signed)
Bilateral lower extremity venous duplex has been completed. Preliminary results can be found in CV Proc through chart review.   01/08/20 2:36 PM Gary Hudson RVT

## 2020-01-08 NOTE — Progress Notes (Signed)
Patient ID: Gary Hudson, male   DOB: November 26, 1965, 54 y.o.   MRN: [id number] BP 128/86 (BP Location: Right Arm)   Pulse (!) 110   Temp (!) 97.4 F (36.3 C) (Oral)   Resp (!) 32   Ht 5' 10.98" (1.803 m)   Wt 75.3 kg   SpO2 (!) 89%   BMI 23.16 kg/m  Alert, oriented x 4 Lungs sound horrible, breathing mildly labored Wound is clean dry no signs of infection He is becoming restless, stated he gave the medical team until Friday to straighten things out or he is leaving. Understand the difficulty of the news he has received the last few days, but his anxiety seems to be taking over.  He still complains of a severe headache. It is not all clear this is due to the  Cyst. I will scan him again, as I would have expected the cyst aspiration would relieve the headache to some degree.

## 2020-01-08 NOTE — Progress Notes (Signed)
Around 0615, patient's O2 sat dropped into mid 80's. Patient was put on a Venturi mask at 10L, which was then increased to 12L. Patient's O2 sats recovering to 87-88%. Respiratory was called at 0631 and recommended putting the patient on a non-rebreather and notifying the provider of increased oxygen requirement. On a non-rebreather, patient's O2 sats recovered to 94-95%. At [phone] on-call MD was notified, with MD acknowledging the change in O2 requirement and stated that the on-coming care team would be made aware.

## 2020-01-08 NOTE — Progress Notes (Addendum)
Subjective:  O/N Events: None  Gary Hudson was seen at bedside this AM. Patient states that his headache is still "killing him." He states that he has had nausea with vomiting and endorses shortness of breath.   Objective:  Vital signs in last 24 hours: Vitals:   01/07/20 2346 01/07/20 2348 01/08/20 0414 01/08/20 0514  BP: (!) 114/57 (!) 114/57 (!) 115/58   Pulse: 84 85 81 78  Resp: (!) 22 (!) 21 (!) 21 (!) 25  Temp: 98 F (36.7 C)  98.4 F (36.9 C)   TempSrc: Oral  Oral   SpO2: 93% 94% 93% 94%  Weight:      Height:       Physical Exam Vitals and nursing note reviewed.  Constitutional:      General: He is not in acute distress.    Appearance: He is ill-appearing.     Comments: Patient sitting in bed with non rebreather, appears ill on appearance, not as commutative compared to yesterday. Answers questions appropriately.  Cardiovascular:     Rate and Rhythm: Normal rate and regular rhythm.     Heart sounds: No murmur. No friction rub. No gallop.   Pulmonary:     Effort: Tachypnea present. No accessory muscle usage.     Breath sounds: No stridor. Examination of the right-upper field reveals rhonchi. Examination of the left-upper field reveals rhonchi. Examination of the right-middle field reveals rhonchi. Examination of the left-middle field reveals rhonchi. Examination of the right-lower field reveals rhonchi. Examination of the left-lower field reveals rhonchi. Rhonchi present.     Comments: Patient has tachypnea (21-25 RR) Coarse breath sounds appreciated bilaterally Abdominal:     General: Bowel sounds are normal.     Palpations: Abdomen is soft.     Tenderness: There is no abdominal tenderness.  Skin:    General: Skin is warm and dry.  Neurological:     Mental Status: He is alert and oriented to person, place, and time.     CBG (last 3)  Recent Labs    01/07/20 1701 01/07/20 2045 01/07/20 2348  GLUCAP 204* 149* 163*    CBC Latest Ref Rng & Units 01/02/2020  12/31/2019 12/30/2019  WBC 4.0 - 10.5 K/uL 24.3(H) 26.4(H) 22.3(H)  Hemoglobin 13.0 - 17.0 g/dL 12.3(L) 13.7 13.8  Hematocrit 39.0 - 52.0 % 38.7(L) 43.1 43.0  Platelets 150 - 400 K/uL 633(H) 767(H) 871(H)   BMP Latest Ref Rng & Units 01/02/2020 12/31/2019 12/30/2019  Glucose 70 - 99 mg/dL 168(H) 150(H) 159(H)  BUN 6 - 20 mg/dL 17 20 21(H)  Creatinine 0.61 - 1.24 mg/dL 0.65 0.67 0.71  Sodium 135 - 145 mmol/L 131(L) 130(L) 131(L)  Potassium 3.5 - 5.1 mmol/L 4.5 4.3 5.0  Chloride 98 - 111 mmol/L 87(L) 89(L) 89(L)  CO2 22 - 32 mmol/L 33(H) 31 28  Calcium 8.9 - 10.3 mg/dL 9.2 9.1 9.6   Assessment/Plan:  Principal Problem:   Lung cancer metastatic to brain [facility]) Active Problems:   Uncontrolled type 2 diabetes mellitus with hyperglycemia (HCC)   Essential hypertension   Status post thoracotomy   Poor dentition   Cavitary lesion of lung   History of pleural effusion   Dyslipidemia   Cerebellar mass   Hypothyroid   Normocytic anemia   Unintentional weight loss   Hemoptysis   Dysphagia   Protein-calorie malnutrition, severe   Adenocarcinoma, metastatic (HCC)  Persistent Headaches Right Cerebellar Cystic Mass: - Neurosurgery consulted, we appreciate their recommendations.  - Surgical pathology:  Collected  - Chemical DVT prophylaxis per neurosurgery recommendations.   - Decadron Taper per neurosurgery.  - Continue dilaudid 1mg  Q2H. - Palliative care consulted, we appreciate their recommendations.   Post Surgical Hypoxia on Nonrebreather:  Patient presenting with oxygen supplementation s/p R. Craniotomy. Patient on non rebreather saturating at 92% on 5L in the room. Patient's vitals showing tachycardia and soft pressures. Xray shows LLL opacity that is concerning for infectious or inflammatory process. Given history of surgery, malignancy, tachycardia, and Hypothermia we are concerned for increased risk of DVT. Patient was seen this morning with SCDs off and was instructed to continue  wearing them. Additionally, he has had several vomiting episodes with new findings on xray concerning for aspiration pneumonia. Will r/o DVT and start on medications for aspiration pneumonia.  - DVT Studies - Vanc and Zosyn per pharmacy for aspiration pneumonia.   COPD Cavitary Lung Disease Malignancy:  Patient underwent LLL biopsy 01/01/20. Labs currently pending.    - Surgical Pathology: squamous cell carcinoma is favored; however, the differential diagnosis includes adenosquamous carcinoma - Aerobic/Anaerobic: NGTD - Fungus Culture: Process - Acid Fast Smear: Negative - Acid Fast Culture: Process - Gram Stain: Rare WBC (PMN and mononuclear). No organisms seen.   Type II Diabetes - Uncontrolled A1c 13.5 on admission. Pt on glimepiride 4mg  daily and metformin 1000mg  BID at home. Patient placed on Decadron taper. - SSI resistant. - Lantus 25U  Hypertension: - Losartan 50mg  QD - HCZ 12.5mg  QD  Hypothyroidism - Levothyroxine 45mcg QD  Prior to Admission Living Arrangement: home Anticipated Discharge Location: home Barriers to Discharge: continued medical work-up  Dispo: Anticipated discharge in approximately 3-4 day(s).   Maudie Mercury, MD 01/08/2020, 6:01 AM Pager: [phone]

## 2020-01-08 NOTE — Consult Note (Signed)
Consultation Note Date: 01/08/2020   Patient Name: Gary Hudson  DOB: 27-Apr-1966  MRN: [id number]  Age / Sex: 54 y.o., male  PCP: Patient, No Pcp Per Referring Physician: Velna Ochs, MD  Reason for Consultation: Establishing goals of care  HPI/Patient Profile: 54 y.o. male  with past medical history of left cavitary lung lesion s/p VATS in 01/2019, diabetes, HTN, and hypothyroidism presented on 12/24/2019 with intractable headache, decreased po intake, continued hemoptysis, and failure to thrive at home. He was recently hospitalized at [facility] 4/6-4/13 for hemoptysis and weight loss, found to have enlargement of his prior cavitary lung lesion with numerous bilateral smaller cavitary lesions and right cerebellar brain mass. Extensive work-up was done to rule out infectious etiology (including TB), all of which was negative. Underwent bronchoscopy 11/30/2019 with endobronchial lesion noted in left lower lobe. FNA, cytology, and pathology from this procedure were all inconclusive.Further work-up with CT-guided lung biopsy planned for 12/05/2019, however patient elected to leave AMA from [place].   Major events during this hospitalization: 5/4: Bronchoscopy showed no malignant cells 5/10: Per pathology note: Left lower lobe biopsy showed poorly differentiated carcinoma (squamous cell carcinoma is favored versus adenosquamous carcinoma) 5/14: Patient underwent right occipital craniectomy for tumor (surgical pathology is pending)  5/17: patient became hypoxic with increasing O2 requirements; started on IV antibiotics for possible aspiration pneumonia   Clinical Assessment and Goals of Care: I have reviewed medical records including EPIC notes, labs and imaging, examined the patient and met at bedside with patient's sister Kieth Brightly  to discuss diagnosis prognosis, GOC, disposition and options. Patient appears lethargic  when we initially enter the room; bedside RN states his blood sugar is low and she is giving him orange juice. Patient becomes more alert after drinking the orange juice. I ask what he knows and understands about his diagnosis. Patient states "ask my sister". Kieth Brightly is aware that he has lung cancer and possibly brain cancer. She states at this time, treatment options have not been discussed with them.    Introduced Palliative Medicine as specialized medical care for people living with serious illness. It focuses on providing relief from the symptoms and stress of a serious illness.   We discussed a brief life review of the patient. He lived at home and was working until very recently.  As far as functional and nutritional status, he was previously independent with all ADLs however sister states he has not been eating much at all over the past few weeks.   We discussed his current illness and what it means in the larger context of his on-going co-morbidities. I attempted to elicit values and goals of care important to the patient. Patient states multiple times, "I want to go home".  When asked if he wanted to pursue treatment of the cancer, he states "if it can be done in my home".  Discussed with patient and sister about the difference between aggressive medical intervention and comfort care will need to be considered in light of the patient's goals of care.  Advanced directives and concepts specific to code status were considered and discussed.  Questions and concerns were addressed.  The family was encouraged to call with questions or concerns.   Primary decision maker: Patient has capacity at this time, with support from his sister Kieth Brightly.  Kieth Brightly is the HCPOA (copy of the document is on the chart)    SUMMARY OF RECOMMENDATIONS   - Currently awaiting surgical pathology report from craniectomy.  - oncology will follow-up regarding prognosis and treatment options - I would have concerns about  this patient undergoing aggressive treatment due to his repeated statements about wanting to go home and his previous episode of leaving [facility] AMA - education provided to patient and sister that cancer treatment can't be provided in the home, that he would receive that treatment in a cancer center   Code Status/Advance Care Planning:  DNR  Symptom Management:   Continue dexamethasone and pain medication for headaches  Palliative Prophylaxis:   Bowel Regimen, Frequent Pain Assessment and Oral Care  Psycho-social/Spiritual:   Desire for further Chaplaincy support:yes  Prognosis:  To be determined  Discharge Planning: To Be Determined      Primary Diagnoses: Present on Admission: . Uncontrolled type 2 diabetes mellitus with hyperglycemia ([place]) . Essential hypertension . Dyslipidemia . Hypothyroid . Normocytic anemia . Unintentional weight loss . Hemoptysis . Adenocarcinoma, metastatic ([place])   I have reviewed the medical record, interviewed the patient and family, and examined the patient. The following aspects are pertinent.  Past Medical History:  Diagnosis Date  . Diabetes mellitus without complication (HCC)    Scheduled Meds: . atorvastatin  20 mg Oral Daily  . Chlorhexidine Gluconate Cloth  6 each Topical Daily  . dexamethasone  4 mg Oral Q8H  . dextrose      . feeding supplement (ENSURE ENLIVE)  237 mL Oral TID BM  . feeding supplement (PRO-STAT SUGAR FREE 64)  30 mL Oral BID  . hydrocerin   Topical BID  . losartan  50 mg Oral Daily   And  . hydrochlorothiazide  12.5 mg Oral Daily  . insulin aspart  0-20 Units Subcutaneous Q4H  . levothyroxine  25 mcg Oral Q0600  . melatonin  3 mg Oral QHS  . multivitamin with minerals  1 tablet Oral Daily  . nicotine  14 mg Transdermal Daily  . pantoprazole  40 mg Oral QHS  . polyethylene glycol  17 g Oral Daily  . senna-docusate  2 tablet Oral BID  . sodium chloride flush  3 mL Intravenous Q12H   Continuous  Infusions: . 0.9 % NaCl with KCl 20 mEq / L 80 mL/hr at 01/08/20 0936  . piperacillin-tazobactam (ZOSYN)  IV 3.375 g (01/08/20 1236)  . vancomycin     PRN Meds:.acetaminophen **OR** acetaminophen, albuterol, diphenhydrAMINE, HYDROcodone-acetaminophen, HYDROmorphone (DILAUDID) injection, labetalol, morphine injection, naLOXone (NARCAN)  injection, ondansetron **OR** ondansetron (ZOFRAN) IV  No Known Allergies Review of Systems  Neurological: Positive for headaches.    Physical Exam Vitals reviewed.  Constitutional:      Appearance: He is ill-appearing.     Comments: Lying in bed; eyes closed; appears to be in mild discomfort  HENT:     Head: Normocephalic and atraumatic.  Pulmonary:     Effort: Tachypnea present.  Skin:    General: Skin is warm and dry.  Neurological:     Mental Status: He is oriented to person, place, and time.     Vital Signs: BP 100/62 (BP Location: Left Arm)  Pulse 89   Temp 97.6 F (36.4 C) (Oral)   Resp (!) 22   Ht 5' 10.98" (1.803 m)   Wt 75.3 kg   SpO2 96%   BMI 23.16 kg/m  Pain Scale: 0-10 POSS *See Group Information*: 1-Acceptable,Awake and alert Pain Score: 10-Worst pain ever   SpO2: SpO2: 96 % O2 Device:SpO2: 96 % O2 Flow Rate: .O2 Flow Rate (L/min): 5 L/min  IO: Intake/output summary:   Intake/Output Summary (Last 24 hours) at 01/08/2020 1424 Last data filed at 01/08/2020 0936 Gross per 24 hour  Intake 1828.85 ml  Output 1275 ml  Net 553.85 ml    LBM: Last BM Date: 01/07/20 Baseline Weight: Weight: 74.8 kg Most recent weight: Weight: 75.3 kg      Palliative Assessment/Data: 30%     Time In: 1030 Time Out: 1140 Time Total: 70 minutes Greater than 50%  of this time was spent counseling and coordinating care related to the above assessment and plan.  Signed by: Lavena Bullion, NP   This NP [name] was present for and in agreement with above assessment and plan   Please contact Palliative Medicine Team phone at  [phone] for questions and concerns.  For individual provider: See Shea Evans

## 2020-01-08 NOTE — Progress Notes (Signed)
Called to patients room.  Patient tried to get out of bed desating etc.  RN gave neb patient continued to desat placed on high flow salter nasal cannula patient sats are maintaining at this time.  RT will continue to monitor.

## 2020-01-09 ENCOUNTER — Inpatient Hospital Stay (HOSPITAL_COMMUNITY): Payer: 59

## 2020-01-09 DIAGNOSIS — R627 Adult failure to thrive: Secondary | ICD-10-CM

## 2020-01-09 LAB — GLUCOSE, CAPILLARY
Glucose-Capillary: 297 mg/dL — ABNORMAL HIGH (ref 70–99)
Glucose-Capillary: 310 mg/dL — ABNORMAL HIGH (ref 70–99)
Glucose-Capillary: 151 mg/dL — ABNORMAL HIGH (ref 70–99)
Glucose-Capillary: 150 mg/dL — ABNORMAL HIGH (ref 70–99)
Glucose-Capillary: 202 mg/dL — ABNORMAL HIGH (ref 70–99)
Glucose-Capillary: 241 mg/dL — ABNORMAL HIGH (ref 70–99)
Glucose-Capillary: 215 mg/dL — ABNORMAL HIGH (ref 70–99)
Glucose-Capillary: 144 mg/dL — ABNORMAL HIGH (ref 70–99)

## 2020-01-09 MED ORDER — HYDROMORPHONE HCL 1 MG/ML IJ SOLN
1.5000 mg | INTRAMUSCULAR | Status: DC | PRN
  Administered 2020-01-09 – 2020-01-12 (×13): 1.5 mg via INTRAVENOUS
  Filled 2020-01-09 (×14): qty 2

## 2020-01-09 NOTE — Progress Notes (Signed)
Nutrition Follow-up  DOCUMENTATION CODES:   Severe malnutrition in context of chronic illness  INTERVENTION:  Continue Ensure Enlive po TID, each supplement provides 350 kcal and 20 grams of protein.  Continue 30 ml Prostat po BID, each supplement provides 100 kcal and 15 grams of protein.   Continue double protein at meals.   Encourage adequate PO intake.   NUTRITION DIAGNOSIS:   Severe Malnutrition related to chronic illness(cavitary lung lesions, cerebellar mass) as evidenced by severe fat depletion, severe muscle depletion, percent weight loss(17.6% weight loss in 10 months); ongoing  GOAL:   Patient will meet greater than or equal to 90% of their needs; progressing  MONITOR:   PO intake, Supplement acceptance, Labs, Weight trends, Skin, I & O's  REASON FOR ASSESSMENT:   Malnutrition Screening Tool    ASSESSMENT:   54 year old male who presented on 5/02 with headache, decreased PO intake, AMS. PMH of loculated left empyema with cavitary lung lesion in the LLL s/p VATS in 01/2019, tobacco use, T2DM, HTN, hypothyroidism, recent hospitalization for cavitary lung lesions and a cerebellar mass of unknown etiology.MRI brain revealed a large cystic mass in the right cerebellar hemisphere.   5/04 - s/p bronchoscopy 5/05 - s/p MBS with recommendations for Regular diet, thin liquids 5/10 - s/p CT guided core biopsy of lung mass  5/14 - Right Suboccipital craniectomy for tumor  Meal completion has been 10-90%. Pt reports appetite is fine and has no abdomina discomforts. Pt currently has Ensure and Prostat ordered with varied consumption. RD to continue with current orders to aid in caloric and protein needs.   Labs and medications reviewed.  Diet Order:   Diet Order            Diet Carb Modified Fluid consistency: Thin; Room service appropriate? Yes with Assist  Diet effective now              EDUCATION NEEDS:   Education needs have been addressed  Skin:  Skin  Assessment: Skin Integrity Issues: Skin Integrity Issues:: Incisions Incisions: head  Last BM:  5/16  Height:   Ht Readings from Last 1 Encounters:  01/05/20 5' 10.98" (1.803 m)    Weight:   Wt Readings from Last 1 Encounters:  01/07/20 75.3 kg    BMI:  Body mass index is 23.16 kg/m.  Estimated Nutritional Needs:   Kcal:  2300-2500  Protein:  105-125 grams  Fluid:  >/= 2.0 L   Corrin Parker, MS, RD, LDN RD pager number/after hours weekend pager number on Amion.

## 2020-01-09 NOTE — Progress Notes (Signed)
PT with Pt. at time of spiritual care visit.  Chaplain will F/U.

## 2020-01-09 NOTE — Progress Notes (Signed)
Pt back to 4NP11 from CT

## 2020-01-09 NOTE — Progress Notes (Signed)
Pt transported to CT ?

## 2020-01-09 NOTE — Progress Notes (Signed)
Patient ID: Gary Hudson, male   DOB: 08/26/1965, 54 y.o.   MRN: [id number] CT reviewed. Cyst is smaller. No real reason to have excruciating headache. Very unusual post op crani. Would not think carcinomatous meningitis as there is no edema, ventricles are small. His pain requirements are unusual.  4th ventricle has opened up. Again would have expected improvement of his headache, since all anatomic features have improved.

## 2020-01-09 NOTE — Progress Notes (Signed)
Occupational Therapy Treatment Patient Details Name: Gary Hudson MRN: [id number] DOB: 1966/01/23 Today's Date: 01/09/2020    History of present illness This is a 54 year old gentleman with a history of DM and previous empyema of the left lower lobe status post VATS decortication in June 2020.  He presents with several months of hemoptysis, headache and unintentional weight loss.  Was recently admitted to [facility] on 11/28/19 with these complaints and had prolonged hospital stay.  Tuberculosis was ruled out.  He had an EBUS which was negative for malignancy.  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  He was supposed to have a CT-guided biopsy but checked himself out AMA.  He presented 12/24/19 to the [facility] ED with worsening shortness of breath and headache.  Pt dx with persistent HA due to R cerebellar cystic mass s/p R occipital craniotomy 01/05/20.  Pt underwent LLL bx on 01/01/20 with pathology pending, suspicious for squamous cell carcinoma, but unconfirmed.     OT comments  Pt completed grooming and toilet transfer supervision level. Pt with family present and agreeable d/c home. Pt requires supervision and rest break due to SOB with task. Pt at adequate level to d/c home with family based on this session.   Follow Up Recommendations  No OT follow up;Supervision - Intermittent    Equipment Recommendations  3 in 1 bedside commode    Recommendations for Other Services      Precautions / Restrictions Precautions Precautions: Fall Precaution Comments: mild gait instability       Mobility Bed Mobility Overal bed mobility: Needs Assistance Bed Mobility: Supine to Sit;Sit to Supine     Supine to sit: Supervision;HOB elevated Sit to supine: Supervision;HOB elevated   General bed mobility comments: up in chair  Transfers Overall transfer level: Needs assistance Equipment used: None Transfers: Sit to/from Stand Sit to Stand: Min guard         General  transfer comment: Min guard assist for safety, balance, and line management.     Balance Overall balance assessment: Mild deficits observed, not formally tested   Sitting balance-Leahy Scale: Good       Standing balance-Leahy Scale: Poor Standing balance comment: needs external support.                            ADL either performed or assessed with clinical judgement   ADL Overall ADL's : Needs assistance/impaired     Grooming: Wash/dry Tree surgeon: Supervision/safety;Regular Museum/gallery exhibitions officer and Hygiene: Moderate assistance Toileting - Clothing Manipulation Details (indicate cue type and reason): calling girlfriend into the bathroom to help with peri care     Functional mobility during ADLs: Supervision/safety General ADL Comments: pt completed bathroom transfer and grooming task. pt SOB with transfer. Educated on energy conservation based on this sessions events.      Vision       Perception     Praxis      Cognition Arousal/Alertness: Awake/alert Behavior During Therapy: WFL for tasks assessed/performed;Impulsive Overall Cognitive Status: Impaired/Different from baseline Area of Impairment: Safety/judgement                         Safety/Judgement: Decreased awareness of safety Awareness: Emergent   General Comments: pt defers to sister for all answers  and when challenged wants sister to help with responses. Pt sister and girlfriend educated on energy conservation. Pt using a bell at night to call sister initailly and need to progress to baby monitor to decrease fall risk.         Exercises Other Exercises Other Exercises: sit<>stand x 5, standing heel raises with UE support & marching in place x 10   Shoulder Instructions       General Comments 5 L 90%     Pertinent Vitals/ Pain       Pain Assessment: 0-10 Pain Score: 7  Pain Location: head Pain  Descriptors / Indicators: Headache Pain Intervention(s): Repositioned;RN gave pain meds during session  Home Living                                          Prior Functioning/Environment              Frequency  Min 2X/week        Progress Toward Goals  OT Goals(current goals can now be found in the care plan section)  Progress towards OT goals: Progressing toward goals  Acute Rehab OT Goals Patient Stated Goal: to decrease his HA Time For Goal Achievement: 01/09/20 Potential to Achieve Goals: Good ADL Goals Pt Will Perform Tub/Shower Transfer: Tub transfer;with modified independence Additional ADL Goal #1: Pt to complete all ADLs independently with 0 instances of LOB. Additional ADL Goal #2: Pt to complete higher level IADLs (i.e. bed making, item retrieval) independently. Additional ADL Goal #3: Pt to tolerate standing up to 10 min independently, in preparation for ADLs.  Plan Discharge plan remains appropriate    Co-evaluation                 AM-PAC OT "6 Clicks" Daily Activity     Outcome Measure   Help from another person eating meals?: None Help from another person taking care of personal grooming?: None Help from another person toileting, which includes using toliet, bedpan, or urinal?: None Help from another person bathing (including washing, rinsing, drying)?: None Help from another person to put on and taking off regular upper body clothing?: None Help from another person to put on and taking off regular lower body clothing?: None 6 Click Score: 24    End of Session Equipment Utilized During Treatment: Oxygen  OT Visit Diagnosis: Muscle weakness (generalized) (M62.81);Pain   Activity Tolerance Patient tolerated treatment well   Patient Left in bed;with call bell/phone within reach   Nurse Communication Mobility status        Time: 1062-6948 OT Time Calculation (min): 37 min  Charges: OT General Charges $OT Visit: 1  Visit OT Treatments $Self Care/Home Management : 23-37 mins   Brynn, OTR/L  Acute Rehabilitation Services Pager: [phone] Office: [phone] .    Jeri Modena 01/09/2020, 2:14 PM

## 2020-01-09 NOTE — Progress Notes (Signed)
Subjective:  O/N Events: None   [name] is sitting up in the chair, feeling much better. Headache is fairly unchanged.  Discussed with patient and his family members that we do not have official pathology results, but he has what appears to be metastatic lung cancer. Family members reiterate that they want medical team to be upfront with them in terms of diagnosis, treatment options and prognosis. We assured them that we would be as soon as we have all the information.   Objective:  Vital signs in last 24 hours: Vitals:   01/09/20 0105 01/09/20 0404 01/09/20 0520 01/09/20 0650  BP:  127/78  109/75  Pulse: (!) 102 86 90 87  Resp: (!) 23 20 (!) 21 19  Temp:  97.7 F (36.5 C)  97.7 F (36.5 C)  TempSrc:  Oral  Oral  SpO2: 95% 95% 95% 93%  Weight:      Height:       Physical Exam Vitals reviewed.  Constitutional:      Appearance: He is ill-appearing.  Cardiovascular:     Rate and Rhythm: Normal rate and regular rhythm.     Heart sounds: No murmur. No friction rub. No gallop.   Pulmonary:     Effort: Pulmonary effort is normal.     Breath sounds: Examination of the right-upper field reveals rhonchi. Examination of the left-upper field reveals rhonchi. Examination of the right-middle field reveals rhonchi. Examination of the left-middle field reveals rhonchi. Examination of the right-lower field reveals rhonchi. Examination of the left-lower field reveals rhonchi. Rhonchi present.     Comments: Tachypnea with RR 19-23, nasal canula in place on 10L Abdominal:     General: Bowel sounds are normal.     Palpations: Abdomen is soft.  Neurological:     Mental Status: He is alert and oriented to person, place, and time.     CBG (last 3)  Recent Labs    01/08/20 1600 01/08/20 2340 01/09/20 0409  GLUCAP 184* 268* 151*    CBC Latest Ref Rng & Units 01/08/2020 01/02/2020 12/31/2019  WBC 4.0 - 10.5 K/uL 19.4(H) 24.3(H) 26.4(H)  Hemoglobin 13.0 - 17.0 g/dL 10.7(L) 12.3(L) 13.7    Hematocrit 39.0 - 52.0 % 33.4(L) 38.7(L) 43.1  Platelets 150 - 400 K/uL 440(H) 633(H) 767(H)   BMP Latest Ref Rng & Units 01/08/2020 01/02/2020 12/31/2019  Glucose 70 - 99 mg/dL 147(H) 168(H) 150(H)  BUN 6 - 20 mg/dL 15 17 20   Creatinine 0.61 - 1.24 mg/dL 0.40(L) 0.65 0.67  Sodium 135 - 145 mmol/L 128(L) 131(L) 130(L)  Potassium 3.5 - 5.1 mmol/L 4.6 4.5 4.3  Chloride 98 - 111 mmol/L 90(L) 87(L) 89(L)  CO2 22 - 32 mmol/L 25 33(H) 31  Calcium 8.9 - 10.3 mg/dL 8.7(L) 9.2 9.1   Assessment/Plan:  Principal Problem:   Lung cancer metastatic to brain [facility]) Active Problems:   Uncontrolled type 2 diabetes mellitus with hyperglycemia (HCC)   Essential hypertension   Status post thoracotomy   Poor dentition   Cavitary lesion of lung   History of pleural effusion   Dyslipidemia   Cerebellar mass   Hypothyroid   Normocytic anemia   Unintentional weight loss   Hemoptysis   Dysphagia   Protein-calorie malnutrition, severe   Adenocarcinoma, 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  - Surgical pathology: Collected  -  Cytology: Pending  - Chemical DVT prophylaxis per neurosurgery recommendations.   - Decadron Taper per neurosurgery.  - dilaudid 1.5 mg Q2H. - Palliative care consulted, we appreciate their recommendations.   - Patient DNR  Post Surgical Hypoxia:  Patient presenting with oxygen supplementation s/p R. Craniotomy. Xray shows LLL opacity that is concerning for infectious or inflammatory process.  - Heparin 5000U  - DVT Studies: Negative - Vanc and Zosyn per pharmacy for aspiration pneumonia. (Day 2)   COPD Cavitary Lung Disease Malignancy:  Patient underwent LLL biopsy 01/01/20. Labs currently pending.  - We appreciate oncology's recommendations.    - Surgical Pathology: squamous cell carcinoma is  favored; however, the differential diagnosis includes adenosquamous carcinoma - Fungus Culture: Process - Acid Fast Culture: Process  Type II Diabetes - Uncontrolled A1c 13.5 on admission. Pt on glimepiride 4mg  daily and metformin 1000mg  BID at home. Patient placed on Decadron taper. - SSI resistant. - Lantus 25U  Hypertension: - Losartan 50mg  QD - HCZ 12.5mg  QD  Hypothyroidism - Levothyroxine 8mcg QD  Prior to Admission Living Arrangement: home Anticipated Discharge Location: home Barriers to Discharge: continued medical work-up  Dispo: Anticipated discharge in approximately 3-4 day(s).   Maudie Mercury, MD 01/09/2020, 6:55 AM Pager: [phone]

## 2020-01-09 NOTE — Progress Notes (Signed)
   01/09/20 1231  Clinical Encounter Type  Visited With Patient and family together  Visit Type Follow-up  Referral From Palliative care team  Consult/Referral To Chaplain  Spiritual Encounters  Spiritual Needs Prayer  Stress Factors  Family Stress Factors Other (Comment) (Pt. spirituality)  This chaplain responded to PMT consult for spiritual care.  The Pt. RN-Kim supported the chaplain in best time for visit.  The Pt. Sister-[name] and Joni are bedside.  The chaplain understands the family is waiting on pathology report to inform next steps for Pt.  The family desired the chaplain's reassurance of God's love for the Pt.  The family accepted F/U spiritual care and prayer with the chaplain.

## 2020-01-09 NOTE — Progress Notes (Signed)
Daily Progress Note   Patient Name: Gary Hudson       Date: 01/09/2020 DOB: December 06, 1965  Age: 54 y.o. MRN#: [id number] Attending Physician: Velna Ochs, MD Primary Care Physician: Patient, No Pcp Per Admit Date: 12/24/2019  Reason for Consultation/Follow-up: continuing [place] conversations  HPI/Patient Profile: 54 y.o. male  with past medical history of left cavitary lung lesion s/p VATS in 01/2019, diabetes, HTN, and hypothyroidism presented on 12/24/2019 with intractable headache, decreased po intake, continued hemoptysis, and failure to thrive at home. He was recently hospitalized at [facility] 4/6-4/13 for hemoptysis and weight loss, found to have enlargement of his prior cavitary lung lesion with numerous bilateral smaller cavitary lesions and right cerebellar brain mass. Extensive work-up was done to rule out infectious etiology (including TB), all of which was negative. Underwent bronchoscopy 11/30/2019 with endobronchial lesion noted in left lower lobe. FNA, cytology, and pathology from this procedure were all inconclusive.Further work-up with CT-guided lung biopsy planned for 12/05/2019, however patient elected to leave AMA from [place].   Major events during this hospitalization: 5/4: Bronchoscopy showed no malignant cells 5/10: Per pathology note: Left lower lobe biopsy showed poorly differentiated carcinoma (squamous cell carcinoma is favored versus adenosquamous carcinoma) 5/14: Patient underwent right occipital craniectomy for tumor (surgical pathology is pending)  5/17: patient became hypoxic with increasing O2 requirements; started on IV antibiotics for possible aspiration pneumonia  Subjective (Today's discussion): Patient appears less agitated and anxious compared to yesterday. Reports  improved pain control of his headache at this time.   Length of Stay: 16  Current Medications: Scheduled Meds:  . atorvastatin  20 mg Oral Daily  . Chlorhexidine Gluconate Cloth  6 each Topical Daily  . dexamethasone  4 mg Oral Q8H  . feeding supplement (ENSURE ENLIVE)  237 mL Oral TID BM  . feeding supplement (PRO-STAT SUGAR FREE 64)  30 mL Oral BID  . heparin injection (subcutaneous)  5,000 Units Subcutaneous Q8H  . hydrocerin   Topical BID  . losartan  50 mg Oral Daily   And  . hydrochlorothiazide  12.5 mg Oral Daily  . insulin aspart  0-20 Units Subcutaneous Q4H  . levothyroxine  25 mcg Oral Q0600  . melatonin  3 mg Oral QHS  . multivitamin with minerals  1 tablet Oral Daily  . nicotine  14  mg Transdermal Daily  . pantoprazole  40 mg Oral QHS  . polyethylene glycol  17 g Oral Daily  . senna-docusate  2 tablet Oral BID  . sodium chloride flush  3 mL Intravenous Q12H    Continuous Infusions: . 0.9 % NaCl with KCl 20 mEq / L 80 mL/hr at 01/09/20 0203  . piperacillin-tazobactam (ZOSYN)  IV 3.375 g (01/09/20 2018)  . vancomycin 1,000 mg (01/09/20 1740)    PRN Meds: acetaminophen **OR** acetaminophen, albuterol, diphenhydrAMINE, HYDROcodone-acetaminophen, HYDROmorphone (DILAUDID) injection, labetalol, morphine injection, naLOXone (NARCAN)  injection, ondansetron **OR** ondansetron (ZOFRAN) IV  Physical Exam Constitutional:      Appearance: He is ill-appearing.  HENT:     Head: Normocephalic and atraumatic.  Pulmonary:     Comments: Breathing labored Neurological:     Mental Status: He is alert.             Vital Signs: BP 114/73 (BP Location: Right Arm)   Pulse 76   Temp (!) 97.5 F (36.4 C) (Oral)   Resp 18   Ht 5' 10.98" (1.803 m)   Wt 75.3 kg   SpO2 97%   BMI 23.16 kg/m  SpO2: SpO2: 97 % O2 Device: O2 Device: Room Air O2 Flow Rate: O2 Flow Rate (L/min): 10 L/min  Intake/output summary:   Intake/Output Summary (Last 24 hours) at 01/09/2020 2346 Last  data filed at 01/09/2020 0900 Gross per 24 hour  Intake 250 ml  Output 700 ml  Net -450 ml   LBM: Last BM Date: 01/07/20 Baseline Weight: Weight: 74.8 kg Most recent weight: Weight: 75.3 kg       Palliative Assessment/Data:      Patient Active Problem List   Diagnosis Date Noted  . Palliative care by specialist   . DNR (do not resuscitate)   . Weakness generalized   . 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 Lung cancer metastatic to brain ([place])   . Protein-calorie malnutrition, severe 01/03/2020  . History of pleural effusion 12/25/2019  . Dyslipidemia 12/25/2019  . Cerebellar mass 12/25/2019  . Hypothyroid 12/25/2019  . Normocytic anemia 12/25/2019  . Unintentional weight loss 12/25/2019  . Hemoptysis 12/25/2019  . Dysphagia 12/25/2019  . Cavitary lesion of lung 12/24/2019  . Poor dentition 01/30/2019  . Status post thoracotomy 01/26/2019  . Uncontrolled type 2 diabetes mellitus with hyperglycemia ([place]) 01/24/2019  . Essential hypertension 01/24/2019    Palliative Care Assessment & Plan   Assessment: Patient with failure to thrive, malnutrition, and acute respiratory failure in the setting of advanced lung cancer and right cerebellar brain mass.   Recommendations/Plan: - awaiting surgical pathology report from craniectomy.  - oncology will follow-up regarding prognosis and treatment options - Patient's sister and HCPOA seems very realistic about his overall status - Continued concerns about this patient undergoing aggressive treatment due to earlier statements about wanting to go home and his previous episode of leaving [facility] AMA  Goals of Care and Additional Recommendations:  Limitations on Scope of Treatment: full scope at this time, awaiting full diagnosis and prognosis  Code Status:  DNR  Prognosis:  To be determined  Discharge Planning:  To be determined  Thank you for allowing the Palliative Medicine Team to assist in the care of this  patient.   Total Time 35 minutes Prolonged Time Billed  no       Greater than 50%  of this time was spent counseling and coordinating care related to the above assessment and plan.  Donalee Citrin  [place], NP  Please contact Palliative Medicine Team phone at [phone] for questions and concerns.

## 2020-01-09 NOTE — Progress Notes (Signed)
OT Cancellation Note  Patient Details Name: DAYVION SANS MRN: [id number] DOB: 01/27/1966   Cancelled Treatment:     Pt with family present and chaplain in the room. OT to check back after patient has time to meet and finish lunch.   Billey Chang, OTR/L  Acute Rehabilitation Services Pager: [phone] Office: [phone] .   01/09/2020, 12:29 PM

## 2020-01-09 NOTE — Progress Notes (Signed)
Physical Therapy Treatment Patient Details Name: Gary Hudson MRN: [id number] DOB: 25-Mar-1966 Today's Date: 01/09/2020    History of Present Illness This is a 54 year old gentleman with a history of DM and previous empyema of the left lower lobe status post VATS decortication in June 2020.  He presents with several months of hemoptysis, headache and unintentional weight loss.  Was recently admitted to [facility] on 11/28/19 with these complaints and had prolonged hospital stay.  Tuberculosis was ruled out.  He had an EBUS which was negative for malignancy.  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  He was supposed to have a CT-guided biopsy but checked himself out AMA.  He presented 12/24/19 to the [facility] ED with worsening shortness of breath and headache.  Pt dx with persistent HA due to R cerebellar cystic mass s/p R occipital craniotomy 01/05/20.  Pt underwent LLL bx on 01/01/20 with pathology pending, suspicious for squamous cell carcinoma, but unconfirmed.      PT Comments    Patient progressing this session with hallway ambulation and working on LE strength.  Still seems somewhat SOB with ambulation, but denied and seems much improved.  Has high O2 requirement, though not on high flo oxygen at this point.  Patient's family in the room and supportive.  Continue to feel he may benefit from follow up up outpatient PT at d/c .    Follow Up Recommendations  Outpatient PT;Supervision for mobility/OOB     Equipment Recommendations  None recommended by PT    Recommendations for Other Services       Precautions / Restrictions Precautions Precautions: Fall Precaution Comments: mild gait instability    Mobility  Bed Mobility               General bed mobility comments: up in chair  Transfers Overall transfer level: Needs assistance Equipment used: None Transfers: Sit to/from Stand Sit to Stand: Min guard         General transfer comment: Min guard assist  for safety, balance, and line management.   Ambulation/Gait Ambulation/Gait assistance: Min assist;Min guard Gait Distance (Feet): 150 Feet Assistive device: 1 person hand held assist Gait Pattern/deviations: Step-through pattern;Wide base of support     General Gait Details: mildly unsteady and with wide BOS some evidence of core instability HHA and min to minguard for balance   Stairs             Wheelchair Mobility    Modified Rankin (Stroke Patients Only)       Balance Overall balance assessment: Needs assistance   Sitting balance-Leahy Scale: Good       Standing balance-Leahy Scale: Poor Standing balance comment: needs external support.                             Cognition Arousal/Alertness: Awake/alert Behavior During Therapy: WFL for tasks assessed/performed Overall Cognitive Status: Impaired/Different from baseline Area of Impairment: Safety/judgement;Awareness                         Safety/Judgement: Decreased awareness of safety Awareness: Emergent   General Comments: slower and more controlled, but needs cues for safety, assist for lines      Exercises Other Exercises Other Exercises: sit<>stand x 5, standing heel raises with UE support & marching in place x 10    General Comments General comments (skin integrity, edema, etc.): on 6L  O2 SpO2 maintained 92% or above with HR max 95      Pertinent Vitals/Pain Pain Assessment: 0-10 Pain Score: 7  Pain Location: head Pain Descriptors / Indicators: Headache    Home Living                      Prior Function            PT Goals (current goals can now be found in the care plan section) Progress towards PT goals: Progressing toward goals    Frequency    Min 3X/week      PT Plan Current plan remains appropriate    Co-evaluation              AM-PAC PT "6 Clicks" Mobility   Outcome Measure  Help needed turning from your back to your side  while in a flat bed without using bedrails?: None Help needed moving from lying on your back to sitting on the side of a flat bed without using bedrails?: None Help needed moving to and from a bed to a chair (including a wheelchair)?: A Little Help needed standing up from a chair using your arms (e.g., wheelchair or bedside chair)?: A Little Help needed to walk in hospital room?: A Little Help needed climbing 3-5 steps with a railing? : A Little 6 Click Score: 20    End of Session Equipment Utilized During Treatment: Oxygen Activity Tolerance: Patient tolerated treatment well Patient left: in chair;with call bell/phone within reach;with family/visitor present   PT Visit Diagnosis: Muscle weakness (generalized) (M62.81);Difficulty in walking, not elsewhere classified (R26.2);Other symptoms and signs involving the nervous system (R29.898);Unsteadiness on feet (R26.81)     Time: 3202-3343 PT Time Calculation (min) (ACUTE ONLY): 16 min  Charges:  $Gait Training: 8-22 mins                     Magda Kiel, [place] [phone] 01/09/2020    Reginia Naas 01/09/2020, 12:31 PM

## 2020-01-10 DIAGNOSIS — G8929 Other chronic pain: Secondary | ICD-10-CM

## 2020-01-10 DIAGNOSIS — R627 Adult failure to thrive: Secondary | ICD-10-CM

## 2020-01-10 LAB — CBC
HCT: 32.6 % — ABNORMAL LOW (ref 39.0–52.0)
Hemoglobin: 10.2 g/dL — ABNORMAL LOW (ref 13.0–17.0)
MCH: 26.8 pg (ref 26.0–34.0)
MCHC: 31.3 g/dL (ref 30.0–36.0)
MCV: 85.8 fL (ref 80.0–100.0)
Platelets: 471 10*3/uL — ABNORMAL HIGH (ref 150–400)
RBC: 3.8 MIL/uL — ABNORMAL LOW (ref 4.22–5.81)
RDW: 15.2 % (ref 11.5–15.5)
WBC: 18.1 10*3/uL — ABNORMAL HIGH (ref 4.0–10.5)
nRBC: 0 % (ref 0.0–0.2)

## 2020-01-10 LAB — SURGICAL PATHOLOGY

## 2020-01-10 LAB — GLUCOSE, CAPILLARY
Glucose-Capillary: 213 mg/dL — ABNORMAL HIGH (ref 70–99)
Glucose-Capillary: 268 mg/dL — ABNORMAL HIGH (ref 70–99)
Glucose-Capillary: 291 mg/dL — ABNORMAL HIGH (ref 70–99)
Glucose-Capillary: 252 mg/dL — ABNORMAL HIGH (ref 70–99)
Glucose-Capillary: 281 mg/dL — ABNORMAL HIGH (ref 70–99)
Glucose-Capillary: 282 mg/dL — ABNORMAL HIGH (ref 70–99)

## 2020-01-10 LAB — CYTOLOGY - NON PAP

## 2020-01-10 MED ORDER — IPRATROPIUM-ALBUTEROL 0.5-2.5 (3) MG/3ML IN SOLN
3.0000 mL | Freq: Four times a day (QID) | RESPIRATORY_TRACT | Status: DC
  Administered 2020-01-10 – 2020-01-11 (×3): 3 mL via RESPIRATORY_TRACT
  Filled 2020-01-10 (×4): qty 3

## 2020-01-10 MED ORDER — DIPHENHYDRAMINE HCL 50 MG/ML IJ SOLN
25.0000 mg | Freq: Once | INTRAMUSCULAR | Status: AC
  Administered 2020-01-10: 25 mg via INTRAVENOUS
  Filled 2020-01-10: qty 1

## 2020-01-10 MED ORDER — KETOROLAC TROMETHAMINE 30 MG/ML IJ SOLN
30.0000 mg | Freq: Once | INTRAMUSCULAR | Status: AC
  Administered 2020-01-10: 30 mg via INTRAVENOUS
  Filled 2020-01-10: qty 1

## 2020-01-10 MED ORDER — PROCHLORPERAZINE EDISYLATE 10 MG/2ML IJ SOLN
10.0000 mg | Freq: Once | INTRAMUSCULAR | Status: AC
  Administered 2020-01-10: 10 mg via INTRAVENOUS
  Filled 2020-01-10: qty 2

## 2020-01-10 NOTE — Progress Notes (Signed)
Patient ID: Gary Hudson, male   DOB: 1965/08/30, 54 y.o.   MRN: [id number] BP (!) 97/59 (BP Location: Right Arm)   Pulse 78   Temp 97.6 F (36.4 C) (Oral)   Resp 18   Ht 5' 10.98" (1.803 m)   Wt 68.8 kg   SpO2 93%   BMI 21.16 kg/m  Alert, oriented x 4, speech is clear and fluent Moving all extremities well Wound is clean, dry, no signs of infection Biopsy shows squamous cell CA

## 2020-01-10 NOTE — Progress Notes (Addendum)
Subjective:  O/N Events: None   Gary Hudson was seen today at bedside. Today he states his headache is approximately the same as yesterday. HE states that he is breathing better today than yesterday, but still requiring oxygen. Does not feel nauseated at this time.    Objective: Vital signs in last 24 hours: Vitals:   01/10/20 0325 01/10/20 0332 01/10/20 0404 01/10/20 0544  BP: 134/74     Pulse: 74 81 63 90  Resp: 18 17 12 19   Temp: (!) 97.5 F (36.4 C)     TempSrc: Oral     SpO2: 94% 94% 95% 93%  Weight:      Height:       Physical Exam Constitutional:      Appearance: He is well-developed.  Cardiovascular:     Rate and Rhythm: Normal rate and regular rhythm.     Heart sounds: No murmur. No friction rub. No gallop.   Pulmonary:     Effort: Pulmonary effort is normal.     Comments: Coarse breath sounds appreciated bilaterally Abdominal:     General: Bowel sounds are normal.     Palpations: Abdomen is soft. There is no mass.     Tenderness: There is no abdominal tenderness. There is no guarding.  Neurological:     Mental Status: He is alert and oriented to person, place, and time.     CBG (last 3)  Recent Labs    01/09/20 2016 01/09/20 2325 01/10/20 0324  GLUCAP 215* 144* 213*    CBC Latest Ref Rng & Units 01/10/2020 01/08/2020 01/02/2020  WBC 4.0 - 10.5 K/uL 18.1(H) 19.4(H) 24.3(H)  Hemoglobin 13.0 - 17.0 g/dL 10.2(L) 10.7(L) 12.3(L)  Hematocrit 39.0 - 52.0 % 32.6(L) 33.4(L) 38.7(L)  Platelets 150 - 400 K/uL 471(H) 440(H) 633(H)   BMP Latest Ref Rng & Units 01/08/2020 01/02/2020 12/31/2019  Glucose 70 - 99 mg/dL 147(H) 168(H) 150(H)  BUN 6 - 20 mg/dL 15 17 20   Creatinine 0.61 - 1.24 mg/dL 0.40(L) 0.65 0.67  Sodium 135 - 145 mmol/L 128(L) 131(L) 130(L)  Potassium 3.5 - 5.1 mmol/L 4.6 4.5 4.3  Chloride 98 - 111 mmol/L 90(L) 87(L) 89(L)  CO2 22 - 32 mmol/L 25 33(H) 31  Calcium 8.9 - 10.3 mg/dL 8.7(L) 9.2 9.1   Assessment/Plan:  Principal Problem:   Lung  cancer metastatic to brain [facility]) Active Problems:   Uncontrolled type 2 diabetes mellitus with hyperglycemia (HCC)   Essential hypertension   Status post thoracotomy   Poor dentition   Cavitary lesion of lung   History of pleural effusion   Dyslipidemia   Cerebellar mass   Hypothyroid   Normocytic anemia   Unintentional weight loss   Hemoptysis   Dysphagia   Protein-calorie malnutrition, severe   Adenocarcinoma, 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  - Surgical pathology: Collected  - Cytology: Pending  - Chemical DVT prophylaxis per neurosurgery recommendations.   - Decadron Taper per neurosurgery.  - dilaudid 1.5 mg Q2H. - Palliative care consulted, we appreciate their recommendations.   - Patient DNR - Will attempt Ketoralac, benadryl, and compazine  - Will reach out to lab about pending surgical pathology   Post Surgical HCAP:  Patient presenting with oxygen supplementation s/p R. Craniotomy. Xray shows LLL opacity that is concerning for infectious or inflammatory process.  - Heparin 5000U  -  DVT Studies: Negative - Vanc and Zosyn per pharmacy (Day 3)  COPD Cavitary Lung Disease Malignancy:  Patient underwent LLL biopsy 01/01/20. Labs currently pending.  - We appreciate oncology's recommendations.    - Surgical Pathology: squamous cell carcinoma is favored; however, the differential diagnosis includes adenosquamous carcinoma - Fungus Culture: Process - Acid Fast Culture: Process - Starting Duoneb treatment  Type II Diabetes - Uncontrolled A1c 13.5 on admission. Pt on glimepiride 4mg  daily and metformin 1000mg  BID at home. Patient placed on Decadron taper. - SSI resistant. - Lantus 25U  Hypertension: - Losartan 50mg  QD - HCZ 12.5mg  QD  Hypothyroidism - Levothyroxine 44mcg  QD  Prior to Admission Living Arrangement: home Anticipated Discharge Location: home Barriers to Discharge: continued medical work-up  Dispo: Anticipated discharge in approximately 3-4 day(s).   Maudie Mercury, MD 01/10/2020, 5:48 AM Pager: [phone]

## 2020-01-10 NOTE — Progress Notes (Addendum)
Daily Progress Note   Patient Name: Gary Hudson       Date: 01/10/2020 DOB: 1966-05-26  Age: 54 y.o. MRN#: [id number] Attending Physician: Velna Ochs, MD Primary Care Physician: Patient, No Pcp Per Admit Date: 12/24/2019  Reason for Consultation/Follow-up: continuing [place] conversations  HPI/Patient Profile:53 y.o.malewith past medical history of left cavitary lung lesion s/p VATS in 01/2019, diabetes, HTN, and hypothyroidism presentedon 5/2/2021with intractable headache, decreased po intake, continued hemoptysis, and failure to thrive at home.He was recently hospitalized at [facility] 4/6-4/13 for hemoptysis and weight loss, found to have enlargement of his prior cavitary lung lesion with numerous bilateral smaller cavitary lesions and right cerebellar brain mass. Extensive work-up was done to rule out infectious etiology (including TB), all of which was negative.Underwent bronchoscopy 11/30/2019 with endobronchial lesion noted in left lower lobe. FNA, cytology, and pathology from this procedure were all inconclusive.Further work-up with CT-guided lung biopsy planned for 12/05/2019, however patient elected to leave AMAfrom [facility].   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 Reports  headache is "ok" at this time. Sister Peggy at the bedside. I informed Vickii Chafe we are still awaiting surgical pathology reports from craniectomy. Encouraged her to proactively think of questions to ask oncology if and when a treatment plan is presented. Discussed my concerns about him being able to tolerate even palliative chemo or radiation in his current condition.  The difference between aggressive medical intervention and comfort care was considered in light of the patient's goals of care. Patient has previously said he wanted to be "home". Hospice Care services outpatient were explained and offered. Peggy states she would be able to care for him in her home if they decide to pursue hospice care. She would have support from the patient's girlfriend.    Length of Stay: 17  Current Medications: Scheduled Meds:  . atorvastatin  20 mg Oral Daily  . Chlorhexidine Gluconate Cloth  6 each Topical Daily  . dexamethasone  4 mg Oral Q8H  . feeding supplement (ENSURE ENLIVE)  237 mL Oral TID BM  . feeding supplement (PRO-STAT SUGAR FREE 64)  30 mL Oral BID  . heparin injection (subcutaneous)  5,000 Units Subcutaneous Q8H  . hydrocerin   Topical BID  . losartan  50 mg Oral Daily   And  . hydrochlorothiazide  12.5 mg Oral Daily  . insulin aspart  0-20 Units Subcutaneous Q4H  . ipratropium-albuterol  3 mL Nebulization Q6H WA  . levothyroxine  25 mcg Oral Q0600  . melatonin  3 mg Oral QHS  . multivitamin with minerals  1 tablet Oral Daily  . nicotine  14 mg Transdermal Daily  . pantoprazole  40 mg Oral QHS  . polyethylene glycol  17 g Oral Daily  . senna-docusate  2 tablet Oral BID  . sodium chloride flush  3 mL Intravenous Q12H    Continuous Infusions: . 0.9 % NaCl with KCl 20 mEq / L 80 mL/hr at 01/10/20 0537  . piperacillin-tazobactam (ZOSYN)  IV 3.375 g (01/10/20 1137)  . vancomycin 1,000 mg (01/10/20 0927)    PRN Meds: acetaminophen **OR** acetaminophen, albuterol, diphenhydrAMINE,  HYDROcodone-acetaminophen, HYDROmorphone (DILAUDID) injection, labetalol, morphine injection, naLOXone (NARCAN)  injection, ondansetron **OR** ondansetron (ZOFRAN) IV  Physical Exam Vitals reviewed.  Constitutional:      Appearance: He is ill-appearing.  HENT:     Head: Normocephalic and atraumatic.  Cardiovascular:     Rate and Rhythm: Normal rate.  Pulmonary:     Breath sounds: Examination of the right-upper field reveals rhonchi. Examination of the left-upper field reveals rhonchi. Examination of the right-lower field reveals rhonchi. Examination of the left-lower field reveals rhonchi. Rhonchi present.     Comments: Breathing mildly labored Neurological:     Mental Status: He is alert.     Comments: Answering questions appropriately             Vital Signs: BP 109/61 (BP Location: Right Arm)   Pulse 70   Temp (!) 97.5 F (36.4 C) (Oral)   Resp 15   Ht 5' 10.98" (1.803 m)   Wt 68.8 kg   SpO2 93%   BMI 21.16 kg/m  SpO2: SpO2: 93 % O2 Device: O2 Device: Nasal Cannula O2 Flow Rate: O2 Flow Rate (L/min): 10 L/min  Intake/output summary:   Intake/Output Summary (Last 24 hours) at 01/10/2020 1158 Last data filed at 01/10/2020 4401 Gross per 24 hour  Intake 4511.43 ml  Output --  Net 4511.43 ml   LBM: Last BM Date: 01/07/20 Baseline Weight: Weight: 74.8 kg Most recent weight: Weight: 68.8 kg       Palliative Assessment/Data: 40%     Patient Active Problem List   Diagnosis Date Noted  . Adult failure to thrive   . Acute intractable headache   . Palliative care by specialist   . DNR (do not resuscitate)   . Weakness generalized   . 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 Lung cancer metastatic to brain ([place])   . Protein-calorie malnutrition, severe 01/03/2020  . History of pleural effusion 12/25/2019  . Dyslipidemia 12/25/2019  . Cerebellar mass 12/25/2019  . Hypothyroid 12/25/2019  . Normocytic anemia 12/25/2019  . Unintentional weight loss  12/25/2019  . Hemoptysis 12/25/2019  . Dysphagia 12/25/2019  . Cavitary lesion of lung 12/24/2019  . Poor dentition 01/30/2019  . Status post thoracotomy 01/26/2019  . Uncontrolled type 2 diabetes mellitus with hyperglycemia ([place]) 01/24/2019  . Essential hypertension 01/24/2019    Palliative  Care Assessment & Plan   Assessment: Patient with failure to thrive, malnutrition, and acute respiratory failure in the setting of advanced lung cancer and right cerebellar brain mass.   Recommendations/Plan: -awaiting surgical pathology report from craniectomy.  - oncology will follow-up regarding prognosis and treatment options - Patient's sister and HCPOA seems very realistic about his overall status, seems to be leaning toward a comfort care path  Goals of Care and Additional Recommendations:  Limitations on Scope of Treatment: full scope at this time, awaiting full diagnosis and prognosis  Code Status:  DNR  Prognosis:   < 6 months  Discharge Planning:  To Be Determined  Care plan was discussed with Dr. Gilford Rile, sister, bedside RN.  Thank you for allowing the Palliative Medicine Team to assist in the care of this patient.   Total Time 35 minutes Prolonged Time Billed  no       Greater than 50%  of this time was spent counseling and coordinating care related to the above assessment and plan.  Lavena Bullion, NP  Please contact Palliative Medicine Team phone at [phone] for questions and concerns.   This NP was present and agrees with above assessment and plan

## 2020-01-10 NOTE — Progress Notes (Signed)
Physical Therapy Treatment Patient Details Name: Gary Hudson MRN: [id number] DOB: 1966-08-15 Today's Date: 01/10/2020    History of Present Illness This is a 54 year old gentleman with a history of DM and previous empyema of the left lower lobe status post VATS decortication in June 2020.  He presents with several months of hemoptysis, headache and unintentional weight loss.  Was recently admitted to [facility] on 11/28/19 with these complaints and had prolonged hospital stay.  Tuberculosis was ruled out.  He had an EBUS which was negative for malignancy.  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  He was supposed to have a CT-guided biopsy but checked himself out AMA.  He presented 12/24/19 to the [facility] ED with worsening shortness of breath and headache.  Pt dx with persistent HA due to R cerebellar cystic mass s/p R occipital craniotomy 01/05/20.  Pt underwent LLL bx on 01/01/20 with pathology pending, suspicious for squamous cell carcinoma, but unconfirmed.      PT Comments    Pt tolerates treatment well, ambulating for multiple trials without significant LOB. Pt does benefit from UE support during mobility to maintain balance as gait without support demonstrates increased sway. Pt also requires frequent rest breaks due to fatigue and headache. Pt is able to negotiate steps without significant physical assistance. PT updating recommendations to home with HHPT as pt fatigues quickly. Pt concerned about finances, if HHPT is too financially taxing and outpatient PT provides financial relief pt may benefit from outpatient PT instead.   Follow Up Recommendations  Home health PT;Supervision/Assistance - 24 hour     Equipment Recommendations  None recommended by PT(pt reports owning cane)    Recommendations for Other Services       Precautions / Restrictions Precautions Precautions: Fall Precaution Comments: mild gait instability Restrictions Weight Bearing Restrictions:  No    Mobility  Bed Mobility Overal bed mobility: Needs Assistance Bed Mobility: Supine to Sit;Sit to Supine     Supine to sit: Modified independent (Device/Increase time);HOB elevated Sit to supine: Modified independent (Device/Increase time);HOB elevated      Transfers Overall transfer level: Needs assistance Equipment used: None Transfers: Sit to/from Stand Sit to Stand: Min guard            Ambulation/Gait Ambulation/Gait assistance: Min assist;Min guard Gait Distance (Feet): 100 Feet(80'. 100') Assistive device: 1 person hand held assist;IV Pole Gait Pattern/deviations: Step-through pattern;Drifts right/left Gait velocity: reduced Gait velocity interpretation: <1.8 ft/sec, indicate of risk for recurrent falls General Gait Details: pt with increased sway when ambulating with HHA of sister. Pt with improved stability with support of IV pole although continues to require minG   Stairs Stairs: Yes Stairs assistance: Min guard Stair Management: One rail Left;Forwards Number of Stairs: 2     Wheelchair Mobility    Modified Rankin (Stroke Patients Only)       Balance Overall balance assessment: Needs assistance Sitting-balance support: No upper extremity supported;Feet supported Sitting balance-Leahy Scale: Good     Standing balance support: Single extremity supported;During functional activity Standing balance-Leahy Scale: Fair Standing balance comment: minG with unilateral UE support of IV pole, sink, or hand hold                            Cognition Arousal/Alertness: Awake/alert Behavior During Therapy: WFL for tasks assessed/performed;Impulsive Overall Cognitive Status: Impaired/Different from baseline Area of Impairment: Safety/judgement;Awareness;Problem solving  Safety/Judgement: Decreased awareness of deficits;Decreased awareness of safety Awareness: Emergent Problem Solving: Slow  processing;Requires verbal cues        Exercises      General Comments General comments (skin integrity, edema, etc.): 5L [place] at rest, 6L on portable tank with sats ranging from 92% to 96% during mobility. Pt is noted to have increased work of breathing at rest and with activity      Pertinent Vitals/Pain Pain Assessment: Faces Faces Pain Scale: Hurts whole lot Pain Location: head Pain Descriptors / Indicators: Grimacing Pain Intervention(s): Monitored during session    Home Living                      Prior Function            PT Goals (current goals can now be found in the care plan section) Acute Rehab PT Goals Patient Stated Goal: to decrease his HA and go home Progress towards PT goals: Progressing toward goals(slowly)    Frequency    Min 3X/week      PT Plan Current plan remains appropriate    Co-evaluation              AM-PAC PT "6 Clicks" Mobility   Outcome Measure  Help needed turning from your back to your side while in a flat bed without using bedrails?: None Help needed moving from lying on your back to sitting on the side of a flat bed without using bedrails?: None Help needed moving to and from a bed to a chair (including a wheelchair)?: A Little Help needed standing up from a chair using your arms (e.g., wheelchair or bedside chair)?: A Little Help needed to walk in hospital room?: A Little Help needed climbing 3-5 steps with a railing? : A Little 6 Click Score: 20    End of Session Equipment Utilized During Treatment: Oxygen Activity Tolerance: Patient tolerated treatment well Patient left: in bed;with family/visitor present;with call bell/phone within reach Nurse Communication: Mobility status PT Visit Diagnosis: Muscle weakness (generalized) (M62.81);Difficulty in walking, not elsewhere classified (R26.2);Other symptoms and signs involving the nervous system (R29.898);Unsteadiness on feet (R26.81)     Time: 8841-6606 PT  Time Calculation (min) (ACUTE ONLY): 34 min  Charges:  $Gait Training: 8-22 mins $Therapeutic Activity: 8-22 mins                     Gary Hudson, PT, DPT Acute Rehabilitation Pager: [phone]    Gary Hudson 01/10/2020, 10:03 AM

## 2020-01-11 ENCOUNTER — Other Ambulatory Visit: Payer: Self-pay | Admitting: *Deleted

## 2020-01-11 DIAGNOSIS — J189 Pneumonia, unspecified organism: Secondary | ICD-10-CM

## 2020-01-11 DIAGNOSIS — R0602 Shortness of breath: Secondary | ICD-10-CM

## 2020-01-11 LAB — CBC
HCT: 31.9 % — ABNORMAL LOW (ref 39.0–52.0)
Hemoglobin: 10 g/dL — ABNORMAL LOW (ref 13.0–17.0)
MCH: 27.2 pg (ref 26.0–34.0)
MCHC: 31.3 g/dL (ref 30.0–36.0)
MCV: 86.7 fL (ref 80.0–100.0)
Platelets: 462 10*3/uL — ABNORMAL HIGH (ref 150–400)
RBC: 3.68 MIL/uL — ABNORMAL LOW (ref 4.22–5.81)
RDW: 15.1 % (ref 11.5–15.5)
WBC: 14.9 10*3/uL — ABNORMAL HIGH (ref 4.0–10.5)
nRBC: 0 % (ref 0.0–0.2)

## 2020-01-11 LAB — BASIC METABOLIC PANEL
Anion gap: 10 (ref 5–15)
BUN: 15 mg/dL (ref 6–20)
CO2: 28 mmol/L (ref 22–32)
Calcium: 8.2 mg/dL — ABNORMAL LOW (ref 8.9–10.3)
Chloride: 92 mmol/L — ABNORMAL LOW (ref 98–111)
Creatinine, Ser: 0.49 mg/dL — ABNORMAL LOW (ref 0.61–1.24)
GFR calc Af Amer: >60 mL/min (ref >60)
GFR calc non Af Amer: >60 mL/min (ref >60)
Glucose, Bld: 236 mg/dL — ABNORMAL HIGH (ref 70–99)
Potassium: 5.1 mmol/L (ref 3.5–5.1)
Sodium: 130 mmol/L — ABNORMAL LOW (ref 135–145)

## 2020-01-11 LAB — GLUCOSE, CAPILLARY
Glucose-Capillary: 239 mg/dL — ABNORMAL HIGH (ref 70–99)
Glucose-Capillary: 188 mg/dL — ABNORMAL HIGH (ref 70–99)
Glucose-Capillary: 281 mg/dL — ABNORMAL HIGH (ref 70–99)
Glucose-Capillary: 427 mg/dL — ABNORMAL HIGH (ref 70–99)

## 2020-01-11 MED ORDER — INSULIN DETEMIR 100 UNIT/ML ~~LOC~~ SOLN
15.0000 [IU] | Freq: Two times a day (BID) | SUBCUTANEOUS | Status: DC
  Administered 2020-01-11 – 2020-01-12 (×2): 15 [IU] via SUBCUTANEOUS
  Filled 2020-01-11 (×3): qty 0.15

## 2020-01-11 MED ORDER — KETOROLAC TROMETHAMINE 30 MG/ML IJ SOLN
30.0000 mg | Freq: Three times a day (TID) | INTRAMUSCULAR | Status: DC
  Administered 2020-01-11 – 2020-01-12 (×3): 30 mg via INTRAVENOUS
  Filled 2020-01-11 (×3): qty 1

## 2020-01-11 MED ORDER — INSULIN GLARGINE 100 UNIT/ML ~~LOC~~ SOLN
25.0000 [IU] | Freq: Every day | SUBCUTANEOUS | Status: DC
  Filled 2020-01-11: qty 0.25

## 2020-01-11 MED ORDER — IPRATROPIUM-ALBUTEROL 0.5-2.5 (3) MG/3ML IN SOLN
3.0000 mL | Freq: Four times a day (QID) | RESPIRATORY_TRACT | Status: DC
  Administered 2020-01-11 – 2020-01-12 (×4): 3 mL via RESPIRATORY_TRACT
  Filled 2020-01-11 (×5): qty 3

## 2020-01-11 MED ORDER — SODIUM CHLORIDE 0.9 % IV SOLN: INTRAVENOUS | Status: DC

## 2020-01-11 NOTE — Progress Notes (Signed)
Pharmacy Antibiotic Note  Gary Hudson is a 54 y.o. male admitted on 12/24/2019 with headaches.  He has a history of left empyema with cavitary lesion s/p VATS in July 2020.  He is also s/p bronch, EBUS, lung biopsy and craniotomy for cerebellar cystic mass.  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  Pharmacy has been consulted for Vancomycin and Zosyn dosing.   SCr 0.49 (stable), WBC 14.9 << 18.1. Today is abx D#4 - it appears the plan is to complete a 5d course.   Plan: - Continue Vancomycin 1g IV ever 8 hours - Continue Zoysn 3.375g IV every 8 hours (infused over 4 hours) - Will continue to follow renal function, culture results, LOT, and antibiotic de-escalation plans   Height: 5' 10.98" (180.3 cm) Weight: 73.2 kg (161 lb 6 oz) IBW/kg (Calculated) : 75.26  Temp (24hrs), Avg:97.7 F (36.5 C), Min:97.5 F (36.4 C), Max:98.5 F (36.9 C)  Recent Labs  Lab 01/08/20 0738 01/08/20 0829 01/10/20 0334 01/11/20 0454 01/11/20 0753  WBC 19.4*  --  18.1*  --  14.9*  CREATININE  --  0.40*  --  0.49*  --     Estimated Creatinine Clearance: 108 mL/min (A) (by C-G formula based on SCr of 0.49 mg/dL (L)).    No Known Allergies  Vanc 5/17 >> Zosyn 5/17 >>  5/2 covid / flu - negative 5/4 AFB -  5/10 LLL lung abscess - Gemella morbillorum   Thank you for allowing pharmacy to be a part of this patient's care.  Alycia Rossetti, PharmD, BCPS Clinical Pharmacist Clinical phone for 01/11/2020: [phone] 01/11/2020 12:23 PM   **Pharmacist phone directory can now be found on [email or web address] ([email or web address]).  Listed under [place].

## 2020-01-11 NOTE — Progress Notes (Addendum)
Paged MD Court Joy on call about patient's blood glucose [phone].   2045-Dr Court Joy returned call. Ordered to give 20units sliding now and he will do some readjustment with lantus insulin.  Also asked about heparin being continued and patient still having some hemoptysis. Continue heparin shots for now. Will continue to observe.

## 2020-01-11 NOTE — Progress Notes (Signed)
The proposed treatment discussed in cancer conference 01/11/20 discussion purpose only and is not a binding recommendation.  The patient was not physically examine nor present for their treatment options.  Therefore, final treatment plans cannot be decided.

## 2020-01-11 NOTE — Progress Notes (Signed)
Daily Progress Note   Patient Name: Gary Hudson       Date: 01/11/2020 DOB: 1965/11/11  Age: 54 y.o. MRN#: [id number] Attending Physician: Velna Ochs, MD Primary Care Physician: Patient, No Pcp Per Admit Date: 12/24/2019  Reason for Consultation/Follow-up: continued GOC discussion  HPI/Patient Profile:54 y.o.malewith past medical history of left cavitary lung lesion s/p VATS in 01/2019, diabetes, HTN, and hypothyroidism presentedon 5/2/2021with intractable headache, decreased po intake, continued hemoptysis, and failure to thrive at home.He was recently hospitalized at [facility] 4/6-4/13 for hemoptysis and weight loss, found to have enlargement of his prior cavitary lung lesion with numerous bilateral smaller cavitary lesions and right cerebellar brain mass. Extensive work-up was done to rule out infectious etiology (including TB), all of which was negative.Underwent bronchoscopy 11/30/2019 with endobronchial lesion noted in left lower lobe. FNA, cytology, and pathology from this procedure were all inconclusive.Further work-up with CT-guided lung biopsy planned for 12/05/2019, however patient elected to leave AMAfrom [facility].   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 Reports continued headache, but that pain is tolerable and well-controlled. He states he is going home tomorrow. Discussed that the medical team needs to make sure his pneumonia is treated and respiratory status is stable prior to discharge.  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 Discussed outpatient palliative care if they pursue cancer treatment versus home with hospice if they do not pursue treatment.   Length of Stay: 18  Physical Exam Constitutional:      Appearance: He is ill-appearing.  HENT:     Head: Normocephalic and atraumatic.  Pulmonary:     Effort: Tachypnea present.     Comments: Breathing labored Neurological:     Mental Status: He is alert.     Comments: Oriented to person and place  Vital Signs: BP 117/62 (BP Location: Right Arm)   Pulse 94   Temp 98.5 F (36.9 C) (Oral)   Resp (!) 25   Ht 5' 10.98" (1.803 m)   Wt 73.2 kg   SpO2 91%   BMI 22.52 kg/m  SpO2: SpO2: 91 % O2 Device: O2 Device: Nasal Cannula O2 Flow Rate: O2 Flow Rate (L/min): 4 L/min  Intake/output summary:   Intake/Output Summary (Last 24 hours) at 01/11/2020 1505 Last data filed at 01/11/2020 1500 Gross per 24 hour  Intake 468 ml  Output 375 ml  Net 93 ml   LBM: Last BM Date: 01/11/20 Baseline Weight: Weight: 74.8 kg Most recent weight: Weight: 73.2 kg  Palliative Care Assessment & Plan   Assessment: Patient with failure to thrive, malnutrition, and acute respiratory failure in the setting of metastatic lung cancer and right cerebellar brain mass.  Recommendations/Plan: - oncology will follow-up regarding prognosis and treatment options - Patient's sister and HCPOA seems very realistic about his overall status - Continued concerns about this patient undergoing aggressive treatment due to earlier statements about wanting to go home and his previous episode of  leaving [facility] AMA - referral for outpatient palliative care - patient/family open to home with hospice if they do not pursue cancer treatment  Please include patient's sister and HCPOA in any discussions regarding cancer treatment Kshawn Canal [phone])  Code Status: DNR  Prognosis:   < 6 months  Discharge Planning:  To Be Determined  Care plan was discussed with Dr. Gilford Rile  Thank you for allowing the Palliative Medicine Team to assist in the care of this patient.   Total Time 35 minutes Prolonged Time Billed  no       Greater than 50%  of this time was spent counseling and coordinating care related to the above assessment and plan.  Lavena Bullion, NP  Please contact Palliative Medicine Team phone at [phone] for questions and concerns.    This NP [name] was presetn and agree with above assessment and plan.

## 2020-01-11 NOTE — Progress Notes (Signed)
Physical Therapy Treatment Patient Details Name: Gary Hudson MRN: [id number] DOB: Jul 15, 1966 Today's Date: 01/11/2020    History of Present Illness This is a 54 year old gentleman with a history of DM and previous empyema of the left lower lobe status post VATS decortication in June 2020.  He presents with several months of hemoptysis, headache and unintentional weight loss.  Was recently admitted to [facility] on 11/28/19 with these complaints and had prolonged hospital stay.  Tuberculosis was ruled out.  He had an EBUS which was negative for malignancy.  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  He was supposed to have a CT-guided biopsy but checked himself out AMA.  He presented 12/24/19 to the [facility] ED with worsening shortness of breath and headache.  Pt dx with persistent HA due to R cerebellar cystic mass s/p R occipital craniotomy 01/05/20.  Pt underwent LLL bx on 01/01/20 with pathology pending, suspicious for squamous cell carcinoma, but unconfirmed.      PT Comments    Pt tolerates treatment well with increased work of breathing noted during ambulation. Pt does recover quickly with seated rest breaks while on 6L [place]. PT attempts to emphasize use of RW for all OOB mobility as pt continues to remain unstable during ambulation without UE support. Pt abandoning RW when returning to room and electing to hold onto furniture, PT then provides further reinforcement of need of use of RW. PT continues to recommend home with home health PT. PT now recommending pt receive a RW at time of discharge which he should use for all ambulation and out of bed mobility. PT also recommends pt has supervision for all out of bed activity from caregivers.   Follow Up Recommendations  Home health PT;Supervision/Assistance - 24 hour     Equipment Recommendations  Rolling walker with 5" wheels    Recommendations for Other Services       Precautions / Restrictions Precautions Precautions:  Fall Precaution Comments: mild gait instability Restrictions Weight Bearing Restrictions: No    Mobility  Bed Mobility Overal bed mobility: Needs Assistance Bed Mobility: Supine to Sit     Supine to sit: Modified independent (Device/Increase time)        Transfers Overall transfer level: Needs assistance Equipment used: Rolling walker (2 wheeled) Transfers: Sit to/from Stand Sit to Stand: Supervision         General transfer comment: stand to sit minG as pt often attempts to sit to early during transfer when not close to chair  Ambulation/Gait Ambulation/Gait assistance: Min assist;Min guard Gait Distance (Feet): 150 Feet(100, 15) Assistive device: Rolling walker (2 wheeled);None Gait Pattern/deviations: Step-through pattern;Trunk flexed Gait velocity: reduced Gait velocity interpretation: 1.31 - 2.62 ft/sec, indicative of limited community ambulator General Gait Details: pt initially ambulates without device to bathroom, reaching for wall or other objects to hold due to lateral instability. With use of RW pt demonstrates no significant losses of balance. PT provides cues to utilize RW for all mobility when out of bed however pt still proceeds to abandon RW 5-10' from recliner before sitting down   Stairs             Wheelchair Mobility    Modified Rankin (Stroke Patients Only)       Balance Overall balance assessment: Needs assistance Sitting-balance support: No upper extremity supported;Feet supported Sitting balance-Leahy Scale: Good Sitting balance - Comments: supervision   Standing balance support: No upper extremity supported;During functional activity Standing balance-Leahy Scale: Fair  Standing balance comment: minG without UE support, supervision with UE support of RW                            Cognition Arousal/Alertness: Awake/alert Behavior During Therapy: WFL for tasks assessed/performed;Impulsive Overall Cognitive Status:  Impaired/Different from baseline Area of Impairment: Safety/judgement;Awareness;Problem solving                         Safety/Judgement: Decreased awareness of safety;Decreased awareness of deficits Awareness: Emergent Problem Solving: Slow processing;Requires verbal cues        Exercises      General Comments General comments (skin integrity, edema, etc.): pt on 4L [place] at rest, desats to 89% on 4L [place], PT increases oxygen to 6L [place] with desat to 90% after ambulation, recovers to 94% within one minute once seated and resting.      Pertinent Vitals/Pain Pain Assessment: Faces Faces Pain Scale: Hurts little more Pain Location: head Pain Descriptors / Indicators: Grimacing Pain Intervention(s): Monitored during session    Home Living                      Prior Function            PT Goals (current goals can now be found in the care plan section) Acute Rehab PT Goals Patient Stated Goal: to decrease his HA and go home Progress towards PT goals: Progressing toward goals    Frequency    Min 3X/week      PT Plan Current plan remains appropriate    Co-evaluation              AM-PAC PT "6 Clicks" Mobility   Outcome Measure  Help needed turning from your back to your side while in a flat bed without using bedrails?: None Help needed moving from lying on your back to sitting on the side of a flat bed without using bedrails?: None Help needed moving to and from a bed to a chair (including a wheelchair)?: A Little Help needed standing up from a chair using your arms (e.g., wheelchair or bedside chair)?: A Little Help needed to walk in hospital room?: A Little Help needed climbing 3-5 steps with a railing? : A Little 6 Click Score: 20    End of Session Equipment Utilized During Treatment: Oxygen Activity Tolerance: Patient tolerated treatment well Patient left: in chair;with call bell/phone within reach;with chair alarm set Nurse Communication:  Mobility status PT Visit Diagnosis: Muscle weakness (generalized) (M62.81);Difficulty in walking, not elsewhere classified (R26.2);Other symptoms and signs involving the nervous system (R29.898);Unsteadiness on feet (R26.81)     Time: 1448-1856 PT Time Calculation (min) (ACUTE ONLY): 27 min  Charges:  $Gait Training: 23-37 mins                     Zenaida Niece, PT, DPT Acute Rehabilitation Pager: [phone]    Zenaida Niece 01/11/2020, 11:39 AM

## 2020-01-11 NOTE — Progress Notes (Addendum)
Subjective:  O/N Events: None   [name] was seen this morning at bedside. He states that he is feeling better this morning compared to yesterday. We discussed his diagnosis, and the patient states that he is happy that he has an answer. He would like to know when he can go home. We discussed his increased oxygen requirement and that we need to wean him down below 4L. Patient voices understanding.     Objective: Vital signs in last 24 hours: Vitals:   01/10/20 2310 01/11/20 0325 01/11/20 0433 01/11/20 0450  BP: 133/69 (!) 150/72    Pulse: 92 90    Resp: (!) 23 14    Temp: 97.7 F (36.5 C)  97.6 F (36.4 C)   TempSrc: Oral  Oral   SpO2: 92% 97%    Weight:    73.2 kg  Height:       Physical Exam Constitutional:      Appearance: He is ill-appearing.  HENT:     Head: Normocephalic and atraumatic.     Comments: Surgical site clean, no purulence or erythema.  Cardiovascular:     Rate and Rhythm: Normal rate and regular rhythm.  Pulmonary:     Effort: Pulmonary effort is normal.     Breath sounds: Normal breath sounds.     Comments: Coarse breaths sounds appreciated, sounds better than yesterday.  Abdominal:     General: Bowel sounds are normal.     Tenderness: There is no abdominal tenderness. There is no guarding.  Musculoskeletal:     Right lower leg: No edema.     Left lower leg: No edema.  Neurological:     Mental Status: He is alert and oriented to person, place, and time.  Psychiatric:        Mood and Affect: Mood normal.        Behavior: Behavior normal.    CBG (last 3)  Recent Labs    01/10/20 2051 01/10/20 2311 01/11/20 0425  GLUCAP 281* 282* 239*    CBC Latest Ref Rng & Units 01/10/2020 01/08/2020 01/02/2020  WBC 4.0 - 10.5 K/uL 18.1(H) 19.4(H) 24.3(H)  Hemoglobin 13.0 - 17.0 g/dL 10.2(L) 10.7(L) 12.3(L)  Hematocrit 39.0 - 52.0 % 32.6(L) 33.4(L) 38.7(L)  Platelets 150 - 400 K/uL 471(H) 440(H) 633(H)   BMP Latest Ref Rng & Units 01/11/2020 01/08/2020  01/02/2020  Glucose 70 - 99 mg/dL 236(H) 147(H) 168(H)  BUN 6 - 20 mg/dL 15 15 17   Creatinine 0.61 - 1.24 mg/dL 0.49(L) 0.40(L) 0.65  Sodium 135 - 145 mmol/L 130(L) 128(L) 131(L)  Potassium 3.5 - 5.1 mmol/L 5.1 4.6 4.5  Chloride 98 - 111 mmol/L 92(L) 90(L) 87(L)  CO2 22 - 32 mmol/L 28 25 33(H)  Calcium 8.9 - 10.3 mg/dL 8.2(L) 8.7(L) 9.2   Assessment/Plan:  Principal Problem:   Lung cancer metastatic to brain [facility]) Active Problems:   Uncontrolled type 2 diabetes mellitus with hyperglycemia (HCC)   Essential hypertension   Status post thoracotomy   Poor dentition   Cavitary lesion of lung   History of pleural effusion   Dyslipidemia   Cerebellar mass   Hypothyroid   Normocytic anemia   Unintentional weight loss   Hemoptysis   Dysphagia   Protein-calorie malnutrition, severe   Adenocarcinoma, metastatic ([place])   Palliative care by specialist   DNR (do not resuscitate)   Weakness generalized   Adult failure to thrive   Acute intractable headache  Persistent Headaches 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  - Surgical pathology: Squamous Cell Carcinoma  - Cytology: Atypical cells present in a background of cellular necrosis  - Chemical DVT prophylaxis per neurosurgery recommendations.   - Decadron taper per neurosurgery.  - dilaudid 1.5 mg Q2H PRN. - Palliative care consulted, we appreciate their recommendations.   - Patient DNR - Continue Ketoralac 30 mg IV   Post Surgical HCAP:  Patient presenting with oxygen supplementation s/p R. Craniotomy. Xray shows LLL opacity that is concerning for infectious or inflammatory process.  - Vanc and Zosyn per pharmacy (Day 4) will complete tomorrow.   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 - We appreciate oncology's recommendations.    - Surgical Pathology: squamous cell carcinoma. - Fungus Culture:  Negative - Acid Fast Culture: Process - Duonebs  Type II Diabetes - Uncontrolled A1c 13.5 on admission. Pt on glimepiride 4mg  daily and metformin 1000mg  BID at home. Patient placed on Decadron taper. - SSI resistant. - Lantus 25U  Hypertension: - Losartan 50mg  QD - HCZ 12.5mg  QD  Hypothyroidism - Levothyroxine 35mcg QD  Prior to Admission Living Arrangement: home Anticipated Discharge Location: home Barriers to Discharge: continued medical work-up  Dispo: Anticipated discharge in approximately 3-4 day(s).   Maudie Mercury, MD 01/11/2020, 7:16 AM Pager: [phone]

## 2020-01-11 NOTE — Plan of Care (Signed)
  Problem: Education: Goal: Knowledge of General Education information will improve Description: Including pain rating scale, medication(s)/side effects and non-pharmacologic comfort measures Outcome: Progressing   Problem: Health Behavior/Discharge Planning: Goal: Ability to manage health-related needs will improve Outcome: Progressing   Problem: Clinical Measurements: Goal: Ability to maintain clinical measurements within normal limits will improve Outcome: Progressing Goal: Will remain free from infection Outcome: Progressing Goal: Diagnostic test results will improve Outcome: Progressing Goal: Respiratory complications will improve Outcome: Progressing Goal: Cardiovascular complication will be avoided Outcome: Progressing   Problem: Activity: Goal: Risk for activity intolerance will decrease Outcome: Progressing   Problem: Coping: Goal: Level of anxiety will decrease Outcome: Progressing   Problem: Pain Managment: Goal: General experience of comfort will improve Outcome: Progressing   Problem: Skin Integrity: Goal: Risk for impaired skin integrity will decrease Outcome: Progressing   Problem: Education: Goal: Knowledge of the prescribed therapeutic regimen will improve Outcome: Progressing   Problem: Activity: Goal: Ability to tolerate increased activity will improve Outcome: Progressing   Problem: Health Behavior/Discharge Planning: Goal: Identification of resources available to assist in meeting health care needs will improve Outcome: Progressing   Problem: Nutrition: Goal: Maintenance of adequate nutrition will improve Outcome: Progressing   Problem: Clinical Measurements: Goal: Complications related to the disease process, condition or treatment will be avoided or minimized Outcome: Progressing   Problem: Respiratory: Goal: Will regain and/or maintain adequate ventilation Outcome: Progressing   Problem: Skin Integrity: Goal: Demonstration of wound  healing without infection will improve Outcome: Progressing

## 2020-01-12 DIAGNOSIS — R0602 Shortness of breath: Secondary | ICD-10-CM

## 2020-01-12 LAB — GLUCOSE, CAPILLARY: Glucose-Capillary: 335 mg/dL — ABNORMAL HIGH (ref 70–99)

## 2020-01-12 MED ORDER — KETOROLAC TROMETHAMINE 10 MG PO TABS: 10.0000 mg | ORAL_TABLET | Freq: Four times a day (QID) | ORAL | 0 refills | Status: AC | PRN

## 2020-01-12 MED ORDER — NAPROXEN 500 MG PO TBEC: 500.0000 mg | DELAYED_RELEASE_TABLET | Freq: Two times a day (BID) | ORAL | 0 refills | Status: AC

## 2020-01-12 MED FILL — KETOROLAC 10 MG TABLET: 10 | 5 days supply | Qty: 20 | Fill #0

## 2020-01-12 MED FILL — NAPROXEN 500 MG TABS: 500 | 30 days supply | Qty: 60 | Fill #0

## 2020-01-12 NOTE — Discharge Summary (Signed)
Name: Gary Hudson MRN: [id number] DOB: 12-14-1965 54 y.o. PCP: Patient, No Pcp Per  Date of Admission: 12/24/2019  3:07 PM Date of Discharge: 01/12/2020 Attending Physician: Velna Ochs, MD  Discharge Diagnosis: 1. 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 Post Surgical Pneumonia  4. Type II Diabetes 5. Hypertension  6. Hypothyroidism  Discharge Medications: Allergies as of 01/12/2020   No Known Allergies     Medication List    STOP taking these medications   losartan-hydrochlorothiazide 50-12.5 MG tablet Commonly known as: HYZAAR     TAKE these medications   atorvastatin 20 MG tablet Commonly known as: LIPITOR Take 20 mg by mouth daily.   gabapentin 300 MG capsule Commonly known as: Neurontin Take 1 capsule (300 mg total) by mouth 3 (three) times daily. As needed for incisional pain   glimepiride 4 MG tablet Commonly known as: AMARYL Take 4 mg by mouth daily with breakfast.   hydrochlorothiazide 12.5 MG capsule Commonly known as: MICROZIDE Take 12.5 mg by mouth daily.   ketorolac 10 MG tablet Commonly known as: TORADOL Take 1 tablet (10 mg total) by mouth every 6 (six) hours as needed (Severe Headache).   Lantus SoloStar 100 UNIT/ML Solostar Pen Generic drug: insulin glargine Inject 30 Units into the skin daily.   levothyroxine 25 MCG tablet Commonly known as: SYNTHROID Take 25 mcg by mouth daily before breakfast.   metFORMIN 500 MG tablet Commonly known as: GLUCOPHAGE Take 500-1,000 mg by mouth See admin instructions. 1030m in the morning and 5064min the evening   naproxen 500 MG EC tablet Commonly known as: EC NAPROSYN Take 1 tablet (500 mg total) by mouth 2 (two) times daily with a meal.   traZODone 50 MG tablet Commonly known as: DESYREL Take 50 mg by mouth at bedtime.   Ventolin HFA 108 (90 Base) MCG/ACT inhaler Generic drug: albuterol Inhale 2 puffs into the lungs every 6 (six) hours as needed  for wheezing or shortness of breath.            Durable Medical Equipment  (From admission, onward)         Start     Ordered   01/12/20 1307  For home use only DME oxygen  Once    Question Answer Comment  Length of Need 12 Months   Mode or (Route) Nasal cannula   Liters per Minute 4   Frequency Continuous (stationary and portable oxygen unit needed)   Oxygen conserving device Yes   Oxygen delivery system Gas      01/12/20 1309   01/12/20 0000  DME Bedside commode    Question:  Patient needs a bedside commode to treat with the following condition  Answer:  Physical deconditioning   01/12/20 1309          Disposition and follow-up:   [facility]  At the hospital follow up visit please address:  1.  Squamous Cell Carcinoma Management, Diabetes Management, Palliative Goals of Care discussion  2.  Labs / imaging needed at time of follow-up: None  3.  Pending labs/ test needing follow-up: None  Follow-up Appointments: Follow-up Information    EnVolanda NapoleonMD. Call in 1 week(s).   Specialty: Oncology Contact information: [id number] [phone]        CaAshok PallMD. Call in 1 week(s).   Specialty: Neurosurgery  Why: Call in 1 week to follow up.  Contact information: [address] [place] [address] [phone]           Hospital Course by problem list: 1. 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 Patient underwent a bronchoalveolar lavage which was negative for fungal/bacterial infection or malignancy. Patient then underwent CT guided biopsy of the patients left lower lobe cavitary  lesion. Bacterial and fungal pathology came back negative, but the pathology report showed poorly differentiated carcinoma. He further was seen by neurosurgery for his excision of his right cerebellar mass which showed squamous cell carcinoma. Patient was managed on decadron and dilaudid during his stay for his headaches. He was discharged and is following with oncology in the outpatient setting for management of his metastatic squamous cell carcinoma.   2. Post Surgical Pneumonia  After patient's right suboccipital craniotomy and tumor resection, he became tachypneic with increased O2 demand. Imaging showed worsening opacity of the lower left cavitary mass which was concerning for superimposed infection or inflammation. Patient had been vomiting during his stay, but had been limited in movement. Given his cavitary lesions and biopsies, he was treated with vancomycin and zosyn and completed a 5 day course of therapy. Patient was able to weaned to 2-4 liters and discharged home in stable condition on oxygen.   3. Type II Diabetes Patient presented with an A1c of 13.5 on home medications of metformin and Lantus. During his hospital course, he was placed on decadron due to his right cerebellar mass with mass effect. He was managed with sliding scale insulin, mealtime insulin, and Lantus. He was discharged home on his home medications.  4. Hypertension  Patient was managed on his home dose losartan and HCTZ. His pressures were well managed during his stay and he was discharged in stable condition.   5. Hypothyroidism Patient with hypothyroidism was managed on his home dose of levothyroxine. He was discharged home with his home medications.   Discharge Vitals:   BP 134/76 (BP Location: Right Arm)   Pulse 76   Temp 97.9 F (36.6 C) (Oral)   Resp (!) 23   Ht 5' 10.98" (1.803 m)   Wt 69.6 kg   SpO2 96%   BMI 21.41 kg/m   Pertinent Labs, Studies, and Procedures:  CHEST - 2 VIEW  COMPARISON:   Chest radiographs and CT, 11/27/2019  FINDINGS: The heart size and mediastinal contours are within normal limits. Redemonstrated heterogeneous opacity and multiple cavitary lesions of the left lower lobe. The visualized skeletal structures are unremarkable.  IMPRESSION: Redemonstrated heterogeneous opacity and multiple cavitary lesions of the left lower lobe, better assessed by prior CT. No new airspace Opacity.   EXAM: CT HEAD WITHOUT CONTRAST  TECHNIQUE: Contiguous axial images were obtained from the base of the skull through the vertex without intravenous contrast.  COMPARISON:  None.  FINDINGS: Brain: Hypodense mass of the right cerebellar hemisphere measures 4.2 x 3.3 cm. There is mass effect on the fourth ventricle and distal cerebral aqueduct. No hydrocephalus. No hemorrhage or extra-axial collection.  Vascular: No hyperdense vessel or unexpected calcification.  Skull: Normal  Sinuses/Orbits: Clear sinuses and mastoids. Normal orbits.  Other: None.  IMPRESSION: Hypodense mass of the right cerebellar hemisphere with mass effect on the fourth ventricle and distal cerebral aqueduct.  No hydrocephalus or hemorrhage.  EXAM: MRI HEAD WITHOUT AND WITH CONTRAST  TECHNIQUE: Multiplanar, multiecho pulse sequences of the brain and surrounding structures were obtained without and with intravenous contrast.  CONTRAST:  7.3m GADAVIST GADOBUTROL 1 MMOL/ML IV SOLN  COMPARISON:  None.  FINDINGS: Brain: Large cystic appearing mass lesion is seen centered on the right cerebellar hemisphere with low signal on T1 and increased signal T2 with layering debris and then rim enhancement. Restricted diffusion is noted along the lesion margins with facilitated diffusion within the lesion. It measures approximately 4.5 x 3.9 x 3.2 cm (T, AP, cc) and causes mass effect on the fourth ventricle, pons and medulla. No herniation through the foramen magnum.  No hydrocephalus or hemorrhage. No other lesion identified.  Vascular: Normal flow voids.  Skull and upper cervical spine: Normal marrow signal.  Sinuses/Orbits: Mucous retention cyst in the left frontal sinus and mucosal thickening of the left ethmoid cells. Orbits are maintained.  Other: None.  IMPRESSION: Large cystic appearing mass centered on the right cerebellar hemisphere measuring approximately 4.5 x 3.9 x 3.2 cm with layering debris and thin rim enhancement. Given absence of intralesional restricted diffusion, findings favor metastasis over abscess. No Hydrocephalus.  Component 2 wk ago  CYTOLOGY - NON GYN CYTOLOGY - NON PAP  CASE: [id number]  PATIENT: Darrin Hefner  Non-Gynecological Cytology Report      Clinical History: LLL BRUSHING  Specimen Submitted: A. LUNG, LLL, BRUSHING:    FINAL MICROSCOPIC DIAGNOSIS:  - No malignant cells identified  - Benign reactive/reparative changes   Component 2 wk ago  Specimen Description BRONCHIAL ALVEOLAR LAVAGE   Special Requests NONE   Gram Stain NO WBC SEEN  NO ORGANISMS SEEN   Culture NO ANAEROBES ISOLATED  Performed at [facility] [address] [place] [id number]  Report Status 01/01/2020 FINAL    Component 2 wk ago  AFB Specimen Processing Concentration   Acid Fast Smear Negative   Comment: (NOTE)     Ref Range & Units 2 wk ago  Fluid Type-FCT  BRONCHIAL ALVEOLAR LAVAGE   Color, Fluid YELLOW COLORLESSAbnormal    Appearance, Fluid CLEAR CLEARAbnormal    Total Nucleated Cell Count, Fluid 0 - 1,000 cu mm 27   Neutrophil Count, Fluid 0 - 25 % 2   Lymphs, Fluid % 5   Monocyte-Macrophage-Serous Fluid 50 - 90 % 93High    Eos, Fluid % 0   Other Cells, Fluid % BRONCHIAL LINING CELLS    Ref Range & Units 2 wk ago  Fungitell Result <80 pg/mL <31    Specimen Information: Abscess     Component 2 wk ago  Specimen Description ABSCESS   Special Requests LEFT LOWER LOBE LUNG   Gram  Stain RARE WBC PRESENT,BOTH PMN AND MONONUCLEAR  NO ORGANISMS SEEN        Component 2 wk ago  SURGICAL PATHOLOGY SURGICAL PATHOLOGY  CASE: [id number]  PATIENT: Dorsel Carr  Surgical Pathology Report      Clinical History: Cavitary LLL lesion (nt)      FINAL MICROSCOPIC DIAGNOSIS:   A. LUNG, LEFT LOWER LOBE, NEEDLE CORE BIOPSY:  - Poorly differentiated carcinoma.   COMMENT:   CK5/6 demonstrates diffuse positive staining. P40 is positive in the  majority of tumor cells. TTF-1 and Napsin-A demonstrate scattered  positive staining. Based on the diffuse CK5/6 staining, squamous cell  carcinoma is favored; however, the differential diagnosis includes  adenosquamous carcinoma. Dr. NLenice Llamaswas paged on 01/04/2020.  Intradepartmental consultation (Dr. Vic Ripper).     Component 2 wk ago  Fungus Stain Final report        Component 2 wk ago  AFB Specimen Processing Concentration   Acid Fast Smear Negative   Comment: (NOTE)    Component 10 d ago  SURGICAL PATHOLOGY SURGICAL PATHOLOGY  CASE: [id number]  PATIENT: Kimsey Maiden  Surgical Pathology Report      Clinical History: Cystic rim enhancing mass (ms)      FINAL MICROSCOPIC DIAGNOSIS:   A. BRAIN, RIGHT CEREBELLAR, BIOPSY:  - Squamous cell carcinoma.   COMMENT:   P40 and CK5/6 are positive in squamous cell carcinoma. TTF-1 and  Napsin-A are negative.   GROSS DESCRIPTION:   The specimen is received fresh and consists of a 2.2 x 1.5 x 0.2 cm  aggregate of tan-red softened tissue. The specimen is entirely  submitted in 1 cassette. Craig Staggers 01/10/2020)     6 d ago  CYTOLOGY - NON GYN CYTOLOGY - NON PAP  CASE: [id number]  PATIENT: Ritter Menden  Non-Gynecological Cytology Report      Clinical History: Newly diagnosed bronchogenic CA, rim enhancing cystic  lesion right cerebellar hemisphere  Specimen Submitted: A. BRAIN, CYST FLUID:    FINAL MICROSCOPIC DIAGNOSIS:  - Atypical  cells present  - Please see diagnostic comments   SPECIMEN ADEQUACY:  Satisfactory for evaluation   DIAGNOSTIC COMMENTS:  Rare atypical yet degenerated cells are present in the background of a  majority of cellular necrosis.     Discharge Instructions: Discharge Instructions    Amb Referral to Palliative Care   Complete by: As directed    DME Bedside commode   Complete by: As directed    Patient needs a bedside commode to treat with the following condition: Physical deconditioning   Diet - low sodium heart healthy   Complete by: As directed    Increase activity slowly   Complete by: As directed    Walker rolling   Complete by: As directed        Signed: Maudie Mercury, MD 01/12/2020, 1:30 PM   Pager: [phone]

## 2020-01-12 NOTE — Progress Notes (Signed)
SATURATION QUALIFICATIONS: (This note is used to comply with regulatory documentation for home oxygen)  Patient Saturations on Room Air at Rest = 92%  Patient Saturations on Room Air while Ambulating = 88%  Patient Saturations on 4 Liters of oxygen while Ambulating = 91%  *started on 1L of oxygen while ambulating  however had to increase to 4L to sustain oxygen  levels at 91%  Following ambulation, at rest patient is on 4L of oxygen  = 92% - 97%

## 2020-01-12 NOTE — Progress Notes (Signed)
Subjective:  O/N Events: None   Patient seen and evaluated at bedside. He states that he is doing well today, and is ready to go home. We addressed that we need to make sure his oxygen requirement is well maintained while ambulating before he goes home. He voiced agreement. We also discussed that oncology will be working with the patient on treatment options. All questions and concerns were addressed.   Objective: Vital signs in last 24 hours: Vitals:   01/11/20 2200 01/12/20 0021 01/12/20 0124 01/12/20 0421  BP:  113/67  126/66  Pulse: 67 74  64  Resp: 17 18  20   Temp:  97.8 F (36.6 C)  97.7 F (36.5 C)  TempSrc:  Oral  Oral  SpO2: 94% 96% 95% 95%  Weight:    69.6 kg  Height:       Physical Exam Constitutional:      General: He is not in acute distress.    Appearance: He is normal weight. He is not ill-appearing or toxic-appearing.  Cardiovascular:     Rate and Rhythm: Normal rate and regular rhythm.     Heart sounds: No murmur. No friction rub. No gallop.   Pulmonary:     Effort: Pulmonary effort is normal.     Comments: Coarse breath sounds appreciated bilaterally, improved from yesterday.  Neurological:     Mental Status: He is alert.     CBG (last 3)  Recent Labs    01/11/20 0820 01/11/20 1141 01/11/20 2019  GLUCAP 188* 281* 427*    CBC Latest Ref Rng & Units 01/11/2020 01/10/2020 01/08/2020  WBC 4.0 - 10.5 K/uL 14.9(H) 18.1(H) 19.4(H)  Hemoglobin 13.0 - 17.0 g/dL 10.0(L) 10.2(L) 10.7(L)  Hematocrit 39.0 - 52.0 % 31.9(L) 32.6(L) 33.4(L)  Platelets 150 - 400 K/uL 462(H) 471(H) 440(H)   BMP Latest Ref Rng & Units 01/11/2020 01/08/2020 01/02/2020  Glucose 70 - 99 mg/dL 236(H) 147(H) 168(H)  BUN 6 - 20 mg/dL 15 15 17   Creatinine 0.61 - 1.24 mg/dL 0.49(L) 0.40(L) 0.65  Sodium 135 - 145 mmol/L 130(L) 128(L) 131(L)  Potassium 3.5 - 5.1 mmol/L 5.1 4.6 4.5  Chloride 98 - 111 mmol/L 92(L) 90(L) 87(L)  CO2 22 - 32 mmol/L 28 25 33(H)  Calcium 8.9 - 10.3 mg/dL 8.2(L)  8.7(L) 9.2   Assessment/Plan:  Principal Problem:   Lung cancer metastatic to brain [facility]) Active Problems:   Uncontrolled type 2 diabetes mellitus with hyperglycemia (HCC)   Essential hypertension   Status post thoracotomy   Poor dentition   Cavitary lesion of lung   History of pleural effusion   Dyslipidemia   Cerebellar mass   Hypothyroid   Normocytic anemia   Unintentional weight loss   Hemoptysis   Dysphagia   Protein-calorie malnutrition, severe   Adenocarcinoma, metastatic ([place])   Palliative care by specialist   DNR (do not resuscitate)   Weakness generalized   Adult failure to thrive   Acute intractable headache   HCAP (healthcare-associated pneumonia)  Persistent Headaches Metastatic Squamous Cell Carcinoma to the R Cerebellum: - Neurosurgery consulted, we appreciate their recommendations.  - Surgical pathology: Squamous Cell Carcinoma  - Cytology: Atypical cells present in a background of cellular necrosis  - Chemical DVT prophylaxis per neurosurgery recommendations.   - Decadron taper per neurosurgery.  - dilaudid 1.5 mg Q2H PRN. - Palliative care consulted, we appreciate their recommendations.   - Patient DNR - Continue Ketoralac 30 mg IV - Ambulate with nursing with pulse ox  Post Surgical HCAP:  Patient presenting with oxygen supplementation s/p R. Craniotomy. Xray shows LLL opacity that is concerning for infectious or inflammatory process.  - Vanc and Zosyn per pharmacy (Day 5) will complete tomorrow.   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 - We appreciate oncology's recommendations.    - Surgical Pathology: squamous cell carcinoma. - Fungus Culture: Negative - Acid Fast Culture: Process - Duonebs  Type II Diabetes - Uncontrolled A1c 13.5 on admission. Pt on glimepiride 4mg  daily and metformin 1000mg  BID at home. Patient placed on Decadron taper. - SSI resistant. - Lantus 25U  Hypertension: -  Losartan 50mg  QD - HCZ 12.5mg  QD  Hypothyroidism - Levothyroxine 70mcg QD  Prior to Admission Living Arrangement: home Anticipated Discharge Location: home Barriers to Discharge: continued medical work-up  Dispo: Anticipated discharge today.   Gary Mercury, MD 01/12/2020, 8:16 AM Pager: [phone]

## 2020-01-12 NOTE — Progress Notes (Signed)
Results for MARTEN, ILES (MRN [id number]) as of 01/12/2020 11:28  Ref. Range 01/11/2020 04:25 01/11/2020 08:20 01/11/2020 11:41 01/11/2020 20:19 01/12/2020 08:19  Glucose-Capillary Latest Ref Range: 70 - 99 mg/dL 239 (H) 188 (H) 281 (H) 427 (H) 335 (H)  Noted that blood sugars are elevated due to steroids. Noted that Levemir was increased to 15 units BID and continuing Novolog RESISTANT correction scale as ordered.   Recommend adding Novolog 8 units TID with meals if patient's CBGs continue to be greater than 200 mg/dl and while on steroids.   Harvel Ricks RN BSN CDE Diabetes Coordinator Pager: [phone]  8am-5pm

## 2020-01-12 NOTE — Progress Notes (Signed)
Looks like Mr. Cellucci might be going home soon.  Hopefully he will be going home today or over the weekend.  The surgical pathology on the CNS lesion is squamous cell carcinoma.  He lives in [place].  As such, we will refer him back to the oncologist in [place] so he can get treated locally.  He will need radiation therapy to the brain.  He needs to have the tumor sent off for molecular studies.  We need to see if there are any targeted mutations that we might be able to take advantage of.  He says his headache is doing a little bit better.  I think he had a CT scan of the brain yesterday which showed the resection cavity where he had the cerebellar lesion removed.  There are no labs back yet today.  I know his blood sugars have been on the high side.  I am sure this is probably from Decadron that he is taking.  His vital signs all look pretty stable.  His temperature is 97.7.  Pulse 64.  Blood pressure 126/66.  His lungs sound pretty clear bilaterally.  Cardiac exam regular rate and rhythm with no murmurs, rubs or bruits.  Abdomen is soft.  His head and neck exam shows the healing craniotomy scar in the posterior scalp.  Neurological exam shows no obvious neurological deficits.  Mr. Vise does have metastatic squamous cell carcinoma of the lung.  He is a heavy smoker.  He had a solitary CNS metastasis.  This was resected.  He will need "adjuvant" radiation to the brain.  Again, the molecular markers will be critical as to how he will be treated systemically.  He does look pretty good.  I know he is gotten great care from all the staff up on 4NP.  Lattie Haw, MD  Psalm 91:1-2

## 2020-01-12 NOTE — Progress Notes (Signed)
Occupational Therapy Treatment Patient Details Name: Gary Hudson MRN: [id number] DOB: 1966-03-01 Today's Date: 01/12/2020    History of present illness This is a 54 year old gentleman with a history of DM and previous empyema of the left lower lobe status post VATS decortication in June 2020.  He presents with several months of hemoptysis, headache and unintentional weight loss.  Was recently admitted to [facility] on 11/28/19 with these complaints and had prolonged hospital stay.  Tuberculosis was ruled out.  He had an EBUS which was negative for malignancy.  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  He was supposed to have a CT-guided biopsy but checked himself out AMA.  He presented 12/24/[facility] to the [facility] ED with worsening shortness of breath and headache.  Pt dx with persistent HA due to R cerebellar cystic mass s/p R occipital craniotomy 01/05/20.  Pt underwent LLL bx on 01/01/20 with pathology pending, suspicious for squamous cell carcinoma, but unconfirmed.     OT comments  Sister and pt voice they feel confident to d/c home at this time with no further questions. Pt using recommendations on arrival of wearing sunglasses to help with HA. Pt reports decr HA less than 7 from previous session. All education is complete and patient indicates understanding.   Follow Up Recommendations  No OT follow up;Supervision - Intermittent    Equipment Recommendations  3 in 1 bedside commode    Recommendations for Other Services      Precautions / Restrictions Precautions Precautions: Fall Precaution Comments: mild gait instability       Mobility Bed Mobility Overal bed mobility: Modified Independent                Transfers                      Balance                                           ADL either performed or assessed with clinical judgement   ADL Overall ADL's : Needs assistance/impaired                                       General ADL Comments: pt in bed with sunglasses on that were recommended by OT last session. pt reports glasses are helping. sister asking questions regarding room setup. Family educated on need for XL twin sheets for hospice bed, electric bed vs crank bed, bed side table setup, use of night light, use of urinal at night, and being able to request 3n1 for night time transfers to decr fall risk. pt nodding head in acknowledgment of discussion     Vision       Perception     Praxis      Cognition Arousal/Alertness: Awake/alert Behavior During Therapy: WFL for tasks assessed/performed;Impulsive                                            Exercises     Shoulder Instructions       General Comments discussed line management in home and with adls. VSS    Pertinent Vitals/ Pain  Pain Assessment: Faces Faces Pain Scale: Hurts little more Pain Location: head  Pain Descriptors / Indicators: Headache Pain Intervention(s): Monitored during session  Home Living                                          Prior Functioning/Environment              Frequency  Min 2X/week        Progress Toward Goals  OT Goals(current goals can now be found in the care plan section)  Progress towards OT goals: Progressing toward goals  Acute Rehab OT Goals Patient Stated Goal: to go home now Time For Goal Achievement: 01/22/2020 Potential to Achieve Goals: Good ADL Goals Pt Will Perform Tub/Shower Transfer: Tub transfer;with modified independence Additional ADL Goal #1: Pt to complete all ADLs independently with 0 instances of LOB. Additional ADL Goal #2: Pt to complete higher level IADLs (i.e. bed making, item retrieval) independently. Additional ADL Goal #3: Pt to tolerate standing up to 10 min independently, in preparation for ADLs.  Plan Discharge plan remains appropriate    Co-evaluation                 AM-PAC OT "6  Clicks" Daily Activity     Outcome Measure   Help from another person eating meals?: None Help from another person taking care of personal grooming?: None Help from another person toileting, which includes using toliet, bedpan, or urinal?: None Help from another person bathing (including washing, rinsing, drying)?: None Help from another person to put on and taking off regular upper body clothing?: None Help from another person to put on and taking off regular lower body clothing?: None 6 Click Score: 24    End of Session Equipment Utilized During Treatment: Oxygen  OT Visit Diagnosis: Muscle weakness (generalized) (M62.81);Pain   Activity Tolerance Patient tolerated treatment well   Patient Left in bed;with call bell/phone within reach   Nurse Communication Mobility status        Time: 5697-9480 OT Time Calculation (min): 12 min  Charges: OT General Charges $OT Visit: 1 Visit OT Treatments $Self Care/Home Management : 8-22 mins   Gary Hudson, OTR/L  Acute Rehabilitation Services Pager: [phone] Office: [phone] .    Gary Hudson 01/12/2020, 2:57 PM

## 2020-01-12 NOTE — TOC Transition Note (Addendum)
Transition of Care [facility]) - CM/SW Discharge Note Marvetta Gibbons RN,BSN Transitions of Care Unit 4NP (non trauma) - RN Case Manager [phone]   Patient Details  Name: Gary Hudson MRN: [id number] Date of Birth: 27-Jun-1966  Transition of Care [facility]) CM/SW Contact:  Gary Patricia, RN Phone Number: 01/12/2020, 2:04 PM   Clinical Narrative:    Pt stable today for transition home, noted pt will need home 02, DME-RW/BSC, and outpt PC referral. CM to bedside to speak with pt - pt's sister Gary Hudson and GF also at the bedside- Discussed transition needs- per sister pt has walker available at home, will need [facility] and home 02- no preference for DME agency- agreeable to [facility]. Also discussed PC and HH recommendations- Pt has [facility] banner- discussed [facility] benefit under insurance- pt and sister agreeable to [facility] f/u and referral- as pt will be followed by [facility]- outpt PC will be connected by [facility] program-- pt and sister agreeable to referral to [facility] for outpt PC needs. Pt interested in [place] referral- will have MD place orders- explained to pt and sister that referral may be difficult to find agency to accept but will try - pt voiced understanding, choice offered for [facility] needs- pt and sister voiced no preference. Sister and GF to provide transport home- pt to be staying with sister- address as confirmed as follows:  [address]  [place] [address] Phone # - [phone]  Also confirmed with pt that he has a PCP- Gary Hudson at [facility]  Call made to [facility] with [facility] for DME needs- [facility] and home 02- once approved with insurance- BSC and portable 02 tank to be delivered to the room prior to discharge  Call made to [place] with [place] for outpt [facility] referral- they will f/u with pt/sister on Monday May 24.   Call made to [place] with [facility] to see if they can accept referral for HHPT- awaiting return call.   [facility]- unable to accept [facility]- unable to accept [facility]- unable to accept [facility]- unable to accept [facility]- unable to accept [place] to accept referral and will f/u with pt on May 24 to do start of care   Final next level of care: [place] Barriers to Discharge: No Barriers Identified   Patient Goals and CMS Choice Patient states their goals for this hospitalization and ongoing recovery are:: return home CMS Medicare.gov Compare Post Acute Care list provided to:: Patient Choice offered to / list presented to : Patient, Sibling(sister)  Discharge Placement               Home with [facility]         Discharge Plan and Services   Discharge Planning Services: CM Consult Post Acute Care Choice: Durable Medical Equipment, Home Health          DME Arranged: Bedside commode, Oxygen DME Agency: [facility] Date DME Agency Contacted: 01/12/20 Time DME Agency Contacted: 1330 Representative spoke with at DME Agency: [place]: PT   Date [place]: 01/12/20 Time [place]: 1345    Social Determinants of Health (SDOH) Interventions     Readmission Risk Interventions Readmission Risk Prevention Plan 01/12/2020  Transportation Screening Complete  PCP or Specialist Appt within 3-5 Days Complete  HRI or [place] Complete  Social Work Consult for [place] Planning/Counseling Complete  Palliative Care Screening Complete  Medication Review Investment banker, corporate  Care Manager) Complete  Some recent data might be hidden

## 2020-01-12 NOTE — Discharge Instructions (Signed)
To Gary Hudson,  It was a pleasure working with you during your hospital stay. 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 During your stay you were given medications for your headache. Additionally, you were treated for pneumonia. You will be discharged with Toradol for your headaches. Please try naproxen to alleviate your headaches as well. Please follow up with oncology for your treatment, and palliative services should be reaching out to you as well. If you have a sudden worsening of your condition, or changes in your mentation please go to the nearest emergency department for reevaluation.     Cervicogenic Headache  A cervicogenic headache is a headache caused by a condition that affects the bones and tissues in your neck (cervical spine). In a cervicogenic headache, the pain moves from your neck to your head. Most cervicogenic headaches start in the upper part of the neck with the first three cervical bones (cervical vertebrae). A cervicogenic headache is diagnosed when a cause can be found in the cervical spine and other causes of headaches can be ruled out. What are the causes? The most common cause of this condition is a traumatic injury to the cervical spine, such as whiplash. Other causes include:  Arthritis.  Broken bone (fracture).  Infection.  Tumor. What are the signs or symptoms? The most common symptoms are neck and head pain. The pain is often located on one side. In some cases, there may be head pain without neck pain. Pain may be felt in the neck, back or side of the head, face, or behind the eyes. Other symptoms include:  Limited movement in the neck.  Arm or shoulder pain. How is this diagnosed? This condition may be diagnosed based on:  Your symptoms.  A physical exam.  An injection that blocks nerve signals (nerve block).  Imaging tests, such as: ? X-rays. ? CT scan. ? MRI. How is this  treated? Treatment for this condition may depend on the underlying condition. Treatment may include:  Medicines, such as: ? NSAIDs. ? Muscle relaxants.  Physical therapy.  Massage therapy.  Complementary therapies, such as: ? Biofeedback. ? Meditation. ? Acupuncture.  Nerve block injections.  Botulinum toxin injections. Your treatment plan may involve working with a pain management team that includes your primary health care provider, a pain management specialist, a neurologist, and a physical therapist. Follow these instructions at home:  Take over-the-counter and prescription medicines only as told by your health care provider.  Do exercises at home as told by your physical therapist.  Return to your normal activities as told by your health care provider. Ask your health care provider what activities are safe for you. Avoid activities that trigger your headaches.  Maintain good neck support and posture at home and at work.  Keep all follow-up visits as told by your health care provider. This is important. Contact a health care provider if you have:  Headaches that are getting worse and happening more often.  Headaches with any of the following: ? Fever. ? Numbness. ? Weakness. ? Dizziness. ? Nausea or vomiting. Get help right away if:  You have a very sudden and severe headache. Summary  A cervicogenic headache is a headache caused by a condition that affects the bones and tissues in your cervical spine.  Your health care provider may diagnose this condition with a physical exam, a nerve block, and imaging tests.  Treatment may include medicine to reduce pain and inflammation,  physical therapy, and nerve block injections.  Complementary therapies, such as acupuncture and meditation, may be added to other treatments.  Your treatment plan may involve working with a pain management team that includes your primary health care provider, a pain management specialist,  a neurologist, and a physical therapist. This information is not intended to replace advice given to you by your health care provider. Make sure you discuss any questions you have with your health care provider. Document Revised: 11/30/2018 Document Reviewed: 08/20/2017 Elsevier Patient Education  [place].

## 2020-01-12 NOTE — Consult Note (Addendum)
   [facility] CM Inpatient Consult   01/12/2020  DARIOUS REHMAN [birth year] [id number]  Patient evaluated for community based chronic disease management services with [place] [facility] as a benefit of patient's [facility]. Spoke with patient at bedside to explain [place] Management services.  Patient will receive post hospital discharge call and for assessments fo community needs for care/disease management.    Spoke with  Inpatient Transition of Care [TOC] Case Manager regarding patient noted to have a primary care provider at [facility].  Patient is eligible for [facility] Management services.  Chart reviewed for potential for [facility] home palliative care program with [facility].  Primary Care Provider:  Inpatient [facility] RNCM confirmed with pt that he has a PCP- Cyndi Bender at [place] note, [place] Management services does not replace or interfere with any services that are arranged by inpatient Transition of Care [TOC] team     3:15 pm:  Patient going to his sister's [place]  home [address]  [place] [address] Phone # - [phone]  For additional questions or referrals please contact:    Natividad Brood, RN BSN [facility] Liaison  [phone] business mobile phone Toll free office [phone]  Fax number: [phone] [email or web address]  [email or web address]

## 2020-01-12 NOTE — Plan of Care (Signed)
  Problem: Health Behavior/Discharge Planning: Goal: Ability to manage health-related needs will improve Outcome: Progressing Note: Received discharge order for patient. Discharge instructions reviewed with patient, including new medication/ prescriptions, diet, activity, follow-up appointment, wound care, and any special instructions. At this time patient have stated understanding of discharge instructions. Patient with no further questions at this time. Pt stable in no acute distress. PIV removed, intact. Pt off unit in wheelchair. Nursing care complete.

## 2020-01-13 LAB — GLUCOSE, CAPILLARY: Glucose-Capillary: 366 mg/dL — ABNORMAL HIGH (ref 70–99)

## 2020-01-15 ENCOUNTER — Encounter (HOSPITAL_COMMUNITY): Payer: Self-pay | Admitting: Emergency Medicine

## 2020-01-15 ENCOUNTER — Inpatient Hospital Stay (HOSPITAL_COMMUNITY)
Admission: EM | Admit: 2020-01-15 | Discharge: 2020-01-16 | DRG: 200 | Disposition: A | Discharge status: Home / Self Care | Payer: 59 | Attending: Internal Medicine | Admitting: Internal Medicine

## 2020-01-15 ENCOUNTER — Telehealth: Payer: Self-pay

## 2020-01-15 ENCOUNTER — Emergency Department (HOSPITAL_COMMUNITY): Payer: 59

## 2020-01-15 ENCOUNTER — Other Ambulatory Visit: Payer: Self-pay

## 2020-01-15 ENCOUNTER — Inpatient Hospital Stay (HOSPITAL_COMMUNITY): Payer: 59

## 2020-01-15 DIAGNOSIS — Z85118 Personal history of other malignant neoplasm of bronchus and lung: Secondary | ICD-10-CM

## 2020-01-15 DIAGNOSIS — C3490 Malignant neoplasm of unspecified part of unspecified bronchus or lung: Secondary | ICD-10-CM | POA: Diagnosis present

## 2020-01-15 DIAGNOSIS — Z66 Do not resuscitate: Secondary | ICD-10-CM | POA: Diagnosis present

## 2020-01-15 DIAGNOSIS — Z794 Long term (current) use of insulin: Secondary | ICD-10-CM | POA: Diagnosis not present

## 2020-01-15 DIAGNOSIS — Z79890 Hormone replacement therapy (postmenopausal): Secondary | ICD-10-CM

## 2020-01-15 DIAGNOSIS — E1165 Type 2 diabetes mellitus with hyperglycemia: Secondary | ICD-10-CM | POA: Diagnosis present

## 2020-01-15 DIAGNOSIS — J9311 Primary spontaneous pneumothorax: Secondary | ICD-10-CM | POA: Diagnosis not present

## 2020-01-15 DIAGNOSIS — Z20822 Contact with and (suspected) exposure to covid-19: Secondary | ICD-10-CM | POA: Diagnosis present

## 2020-01-15 DIAGNOSIS — I10 Essential (primary) hypertension: Secondary | ICD-10-CM | POA: Diagnosis present

## 2020-01-15 DIAGNOSIS — J939 Pneumothorax, unspecified: Secondary | ICD-10-CM

## 2020-01-15 DIAGNOSIS — J930 Spontaneous tension pneumothorax: Principal | ICD-10-CM | POA: Diagnosis present

## 2020-01-15 DIAGNOSIS — E860 Dehydration: Secondary | ICD-10-CM | POA: Diagnosis present

## 2020-01-15 DIAGNOSIS — E785 Hyperlipidemia, unspecified: Secondary | ICD-10-CM | POA: Diagnosis present

## 2020-01-15 DIAGNOSIS — Z87891 Personal history of nicotine dependence: Secondary | ICD-10-CM

## 2020-01-15 DIAGNOSIS — G8929 Other chronic pain: Secondary | ICD-10-CM | POA: Diagnosis not present

## 2020-01-15 DIAGNOSIS — Z515 Encounter for palliative care: Secondary | ICD-10-CM | POA: Diagnosis present

## 2020-01-15 DIAGNOSIS — Z791 Long term (current) use of non-steroidal anti-inflammatories (NSAID): Secondary | ICD-10-CM

## 2020-01-15 DIAGNOSIS — Z8249 Family history of ischemic heart disease and other diseases of the circulatory system: Secondary | ICD-10-CM

## 2020-01-15 DIAGNOSIS — Z79899 Other long term (current) drug therapy: Secondary | ICD-10-CM | POA: Diagnosis not present

## 2020-01-15 DIAGNOSIS — R519 Headache, unspecified: Secondary | ICD-10-CM | POA: Diagnosis not present

## 2020-01-15 DIAGNOSIS — C7931 Secondary malignant neoplasm of brain: Secondary | ICD-10-CM | POA: Diagnosis not present

## 2020-01-15 DIAGNOSIS — E039 Hypothyroidism, unspecified: Secondary | ICD-10-CM | POA: Diagnosis present

## 2020-01-15 DIAGNOSIS — I4891 Unspecified atrial fibrillation: Secondary | ICD-10-CM | POA: Diagnosis present

## 2020-01-15 DIAGNOSIS — Z9689 Presence of other specified functional implants: Secondary | ICD-10-CM

## 2020-01-15 LAB — CBC WITH DIFFERENTIAL/PLATELET
Abs Immature Granulocytes: 0.28 10*3/uL — ABNORMAL HIGH (ref 0.00–0.07)
Basophils Absolute: 0 10*3/uL (ref 0.0–0.1)
Basophils Relative: 0 %
Eosinophils Absolute: 0 10*3/uL (ref 0.0–0.5)
Eosinophils Relative: 0 %
HCT: 40.1 % (ref 39.0–52.0)
Hemoglobin: 12.4 g/dL — ABNORMAL LOW (ref 13.0–17.0)
Immature Granulocytes: 2 %
Lymphocytes Relative: 12 %
Lymphs Abs: 2.4 10*3/uL (ref 0.7–4.0)
MCH: 27.1 pg (ref 26.0–34.0)
MCHC: 30.9 g/dL (ref 30.0–36.0)
MCV: 87.6 fL (ref 80.0–100.0)
Monocytes Absolute: 0.7 10*3/uL (ref 0.1–1.0)
Monocytes Relative: 4 %
Neutro Abs: 15.6 10*3/uL — ABNORMAL HIGH (ref 1.7–7.7)
Neutrophils Relative %: 82 %
Platelets: 626 10*3/uL — ABNORMAL HIGH (ref 150–400)
RBC: 4.58 MIL/uL (ref 4.22–5.81)
RDW: 15.9 % — ABNORMAL HIGH (ref 11.5–15.5)
WBC: 19.1 10*3/uL — ABNORMAL HIGH (ref 4.0–10.5)
nRBC: 0 % (ref 0.0–0.2)

## 2020-01-15 LAB — COMPREHENSIVE METABOLIC PANEL
ALT: 26 U/L (ref 0–44)
AST: 19 U/L (ref 15–41)
Albumin: 2.2 g/dL — ABNORMAL LOW (ref 3.5–5.0)
Alkaline Phosphatase: 207 U/L — ABNORMAL HIGH (ref 38–126)
Anion gap: 15 (ref 5–15)
BUN: 18 mg/dL (ref 6–20)
CO2: 28 mmol/L (ref 22–32)
Calcium: 9 mg/dL (ref 8.9–10.3)
Chloride: 89 mmol/L — ABNORMAL LOW (ref 98–111)
Creatinine, Ser: 0.56 mg/dL — ABNORMAL LOW (ref 0.61–1.24)
GFR calc Af Amer: >60 mL/min (ref >60)
GFR calc non Af Amer: >60 mL/min (ref >60)
Glucose, Bld: 288 mg/dL — ABNORMAL HIGH (ref 70–99)
Potassium: 4.8 mmol/L (ref 3.5–5.1)
Sodium: 132 mmol/L — ABNORMAL LOW (ref 135–145)
Total Bilirubin: 0.5 mg/dL (ref 0.3–1.2)
Total Protein: 7 g/dL (ref 6.5–8.1)

## 2020-01-15 LAB — CBG MONITORING, ED
Glucose-Capillary: 220 mg/dL — ABNORMAL HIGH (ref 70–99)
Glucose-Capillary: 245 mg/dL — ABNORMAL HIGH (ref 70–99)
Glucose-Capillary: 239 mg/dL — ABNORMAL HIGH (ref 70–99)

## 2020-01-15 LAB — LACTIC ACID, PLASMA: Lactic Acid, Venous: 2.2 mmol/L (ref 0.5–1.9)

## 2020-01-15 LAB — TROPONIN I (HIGH SENSITIVITY): Troponin I (High Sensitivity): 8 ng/L (ref <18)

## 2020-01-15 LAB — SARS CORONAVIRUS 2 BY RT PCR (HOSPITAL ORDER, PERFORMED IN ~~LOC~~ HOSPITAL LAB): SARS Coronavirus 2: NEGATIVE

## 2020-01-15 MED ORDER — TRAZODONE HCL 50 MG PO TABS
50.0000 mg | ORAL_TABLET | Freq: Every day | ORAL | Status: DC
  Administered 2020-01-15: 50 mg via ORAL
  Filled 2020-01-15: qty 1

## 2020-01-15 MED ORDER — LACTATED RINGERS IV SOLN: INTRAVENOUS | Status: AC

## 2020-01-15 MED ORDER — DILTIAZEM LOAD VIA INFUSION
15.0000 mg | Freq: Once | INTRAVENOUS | Status: AC
  Administered 2020-01-15: 15 mg via INTRAVENOUS
  Filled 2020-01-15: qty 15

## 2020-01-15 MED ORDER — POLYETHYLENE GLYCOL 3350 17 G PO PACK: 17.0000 g | PACK | Freq: Every day | ORAL | Status: DC

## 2020-01-15 MED ORDER — IPRATROPIUM BROMIDE HFA 17 MCG/ACT IN AERS
2.0000 | INHALATION_SPRAY | Freq: Once | RESPIRATORY_TRACT | Status: AC
  Administered 2020-01-15: 2 via RESPIRATORY_TRACT
  Filled 2020-01-15: qty 12.9

## 2020-01-15 MED ORDER — GABAPENTIN 100 MG PO CAPS
300.0000 mg | ORAL_CAPSULE | Freq: Three times a day (TID) | ORAL | Status: DC
  Administered 2020-01-15 (×2): 300 mg via ORAL
  Filled 2020-01-15 (×2): qty 3

## 2020-01-15 MED ORDER — ACETAMINOPHEN 650 MG RE SUPP: 650.0000 mg | Freq: Four times a day (QID) | RECTAL | Status: DC | PRN

## 2020-01-15 MED ORDER — ATORVASTATIN CALCIUM 10 MG PO TABS
20.0000 mg | ORAL_TABLET | Freq: Every day | ORAL | Status: DC
  Administered 2020-01-15: 20 mg via ORAL
  Filled 2020-01-15: qty 2

## 2020-01-15 MED ORDER — ONDANSETRON HCL 4 MG PO TABS: 4.0000 mg | ORAL_TABLET | Freq: Four times a day (QID) | ORAL | Status: DC | PRN

## 2020-01-15 MED ORDER — ALBUTEROL SULFATE HFA 108 (90 BASE) MCG/ACT IN AERS
8.0000 | INHALATION_SPRAY | Freq: Once | RESPIRATORY_TRACT | Status: AC
  Administered 2020-01-15: 8 via RESPIRATORY_TRACT
  Filled 2020-01-15: qty 6.7

## 2020-01-15 MED ORDER — SENNOSIDES-DOCUSATE SODIUM 8.6-50 MG PO TABS
1.0000 | ORAL_TABLET | Freq: Two times a day (BID) | ORAL | Status: DC
  Administered 2020-01-15: 1 via ORAL
  Filled 2020-01-15: qty 1

## 2020-01-15 MED ORDER — INSULIN GLARGINE 100 UNIT/ML ~~LOC~~ SOLN
25.0000 [IU] | Freq: Every day | SUBCUTANEOUS | Status: DC
  Administered 2020-01-15: 25 [IU] via SUBCUTANEOUS
  Filled 2020-01-15 (×2): qty 0.25

## 2020-01-15 MED ORDER — OXYCODONE HCL 5 MG PO TABS
5.0000 mg | ORAL_TABLET | ORAL | Status: DC | PRN
  Administered 2020-01-15: 5 mg via ORAL
  Filled 2020-01-15: qty 1

## 2020-01-15 MED ORDER — ONDANSETRON HCL 4 MG/2ML IJ SOLN: 4.0000 mg | Freq: Four times a day (QID) | INTRAMUSCULAR | Status: DC | PRN

## 2020-01-15 MED ORDER — FENTANYL CITRATE (PF) 100 MCG/2ML IJ SOLN
100.0000 ug | Freq: Once | INTRAMUSCULAR | Status: AC
  Administered 2020-01-15: 100 ug via INTRAVENOUS
  Filled 2020-01-15: qty 2

## 2020-01-15 MED ORDER — INSULIN ASPART 100 UNIT/ML ~~LOC~~ SOLN: 0.0000 [IU] | Freq: Three times a day (TID) | SUBCUTANEOUS | Status: DC

## 2020-01-15 MED ORDER — HYDROMORPHONE HCL 1 MG/ML IJ SOLN
1.0000 mg | INTRAMUSCULAR | Status: DC | PRN
  Administered 2020-01-16 (×3): 1 mg via INTRAVENOUS
  Filled 2020-01-15 (×2): qty 1

## 2020-01-15 MED ORDER — ACETAMINOPHEN 325 MG PO TABS: 650.0000 mg | ORAL_TABLET | Freq: Four times a day (QID) | ORAL | Status: DC | PRN

## 2020-01-15 MED ORDER — INSULIN ASPART 100 UNIT/ML ~~LOC~~ SOLN
0.0000 [IU] | Freq: Every day | SUBCUTANEOUS | Status: DC
  Administered 2020-01-15: 2 [IU] via SUBCUTANEOUS

## 2020-01-15 MED ORDER — LEVOTHYROXINE SODIUM 25 MCG PO TABS
25.0000 ug | ORAL_TABLET | Freq: Every day | ORAL | Status: DC
  Administered 2020-01-16: 25 ug via ORAL
  Filled 2020-01-15: qty 1

## 2020-01-15 MED ORDER — LIDOCAINE-EPINEPHRINE (PF) 2 %-1:200000 IJ SOLN
20.0000 mL | Freq: Once | INTRAMUSCULAR | Status: AC
  Administered 2020-01-15: 20 mL via INTRADERMAL
  Filled 2020-01-15: qty 20

## 2020-01-15 MED ORDER — ALBUTEROL SULFATE (2.5 MG/3ML) 0.083% IN NEBU: 2.5000 mg | INHALATION_SOLUTION | Freq: Four times a day (QID) | RESPIRATORY_TRACT | Status: DC | PRN

## 2020-01-15 MED ORDER — DILTIAZEM HCL-DEXTROSE 125-5 MG/125ML-% IV SOLN (PREMIX)
5.0000 mg/h | INTRAVENOUS | Status: DC
  Administered 2020-01-15: 5 mg/h via INTRAVENOUS
  Filled 2020-01-15 (×3): qty 125

## 2020-01-15 MED ORDER — ENOXAPARIN SODIUM 40 MG/0.4ML ~~LOC~~ SOLN
40.0000 mg | SUBCUTANEOUS | Status: DC
  Administered 2020-01-15: 40 mg via SUBCUTANEOUS
  Filled 2020-01-15: qty 0.4

## 2020-01-15 NOTE — ED Notes (Signed)
Pt at CT at this time.

## 2020-01-15 NOTE — ED Notes (Signed)
Received call from Admitting MD.  New orders received

## 2020-01-15 NOTE — ED Triage Notes (Signed)
Pt in via [facility] for HA and sob. Hx of stg 4 Brain and Lung CA. Wears O2 at home, up to 6L today, sats 95%. HR ST 120's. Pt states HA has lingered ever since brain surgery

## 2020-01-15 NOTE — ED Notes (Signed)
Spoke with pt's daughter via telephone.  Updated her on her fathers condition.

## 2020-01-15 NOTE — Consult Note (Addendum)
[address]       [place] [address]             [phone]        Bach A Mericle Springer Medical Record #[id number] Date of Birth: 07-Oct-1965  Referring: No ref. provider found Primary Care: Cyndi Bender, PA-C Primary Cardiologist:No primary care provider on file.  Chief Complaint:    Chief Complaint  Patient presents with  . Headache  . Shortness of Breath    History of Present Illness:      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  The patient is well known to our team and underwent a left VATs, drainage of an empyema with decortication of the left lower lobe and debridement of left lower lobe lung abscess with Dr. Prescott Gum last year. 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  He has been having headaches ever since his surgery.  He reports worsening shortness of breath and had to turn his home oxygen up to 6 L.  He was found to have tension pneumothorax on the right with mediastinal shift.  Immediate consent was obtained and a pigtail chest tube was placed by the ED physician with improvement of oxygenation and work of breathing.  A chest x-ray was obtained this afternoon which showed reexpansion of the right lung.  There was a tiny residual right apical pneumothorax. We are consulted for management of his right chest tube.   Current Activity/ Functional Status: Patient was independent with mobility/ambulation, transfers, ADL's, IADL's.   Zubrod Score: At the time of surgery this patient's most appropriate activity status/level should be described as: []     0    Normal activity, no symptoms [x]     1    Restricted in physical strenuous activity but  ambulatory, able to do out light work []     2    Ambulatory and capable of self care, unable to do work activities, up and about                 more than 50%  Of the time                            []     3    Only limited self care, in bed greater than 50% of waking hours []     4    Completely disabled, no self care, confined to bed or chair []     5    Moribund  Past Medical History:  Diagnosis Date  . Diabetes mellitus without complication [facility])     Past Surgical History:  Procedure Laterality Date  . BRONCHIAL BRUSHINGS  12/26/2019   Procedure: BRONCHIAL BRUSHINGS;  Surgeon: Spero Geralds, MD;  Location: [facility] ENDOSCOPY;  Service: Pulmonary;;  . BRONCHIAL WASHINGS  12/26/2019   Procedure: BRONCHIAL WASHINGS;  Surgeon: Spero Geralds, MD;  Location[facility] ENDOSCOPY;  Service: Pulmonary;;  . CRANIOTOMY Right 01/05/2020   Procedure: Right Suboccipital craniectomy for tumor;  Surgeon: Ashok Pall, MD;  Location: [place];  Service: Neurosurgery;  Laterality: Right;  . TEE WITHOUT CARDIOVERSION N/A 01/26/2019   Procedure: TRANSESOPHAGEAL ECHOCARDIOGRAM (TEE);  Surgeon: Prescott Gum, Collier Salina, MD;  Location: [facility] OR;  Service: Thoracic;  Laterality: N/A;  . VIDEO ASSISTED THORACOSCOPY (VATS)/EMPYEMA Left 01/26/2019   Procedure: VIDEO ASSISTED THORACOSCOPY (VATS)/DRAINAGE OF EMPYEMA  AND DECORTICATION;  Surgeon: Ivin Poot, MD;  Location: [facility];  Service: Thoracic;  Laterality: Left;  Marland Kitchen VIDEO BRONCHOSCOPY N/A 01/26/2019   Procedure: VIDEO BRONCHOSCOPY;  Surgeon: Prescott Gum, Collier Salina, MD;  Location: [place];  Service: Thoracic;  Laterality: N/A;  . VIDEO BRONCHOSCOPY N/A 12/26/2019   Procedure: VIDEO BRONCHOSCOPY WITHOUT FLUORO;  Surgeon: Spero Geralds, MD;  Location: [facility] ENDOSCOPY;  Service: Pulmonary;  Laterality: N/A;    Social History   Tobacco Use  Smoking Status Former Smoker  Smokeless Tobacco Never Used    Social History   Substance and Sexual Activity  Alcohol Use Never     No Known  Allergies  Current Facility-Administered Medications  Medication Dose Route Frequency Provider Last Rate Last Admin  . acetaminophen (TYLENOL) tablet 650 mg  650 mg Oral Q6H PRN Ina Homes, MD       Or  . acetaminophen (TYLENOL) suppository 650 mg  650 mg Rectal Q6H PRN Helberg, Justin, MD      . albuterol (PROVENTIL) (2.5 MG/3ML) 0.083% nebulizer solution 2.5 mg  2.5 mg Inhalation Q6H PRN Helberg, Larkin Ina, MD      . albuterol (VENTOLIN HFA) 108 (90 Base) MCG/ACT inhaler 8 puff  8 puff Inhalation Once Helberg, Larkin Ina, MD      . atorvastatin (LIPITOR) tablet 20 mg  20 mg Oral Daily Helberg, Justin, MD      . enoxaparin (LOVENOX) injection 40 mg  40 mg Subcutaneous Q24H Helberg, Justin, MD      . gabapentin (NEURONTIN) capsule 300 mg  300 mg Oral TID Ina Homes, MD      . insulin glargine (LANTUS) injection 25 Units  25 Units Subcutaneous Daily Helberg, Justin, MD      . ipratropium (ATROVENT HFA) inhaler 2 puff  2 puff Inhalation Once Ina Homes, MD      . lactated ringers infusion   Intravenous Continuous Ina Homes, MD      . Derrill Memo ON 12/27/2019] levothyroxine (SYNTHROID) tablet 25 mcg  25 mcg Oral QAC breakfast Helberg, Justin, MD      . ondansetron (ZOFRAN) tablet 4 mg  4 mg Oral Q6H PRN Ina Homes, MD       Or  . ondansetron (ZOFRAN) injection 4 mg  4 mg Intravenous Q6H PRN Helberg, Justin, MD      . traZODone (DESYREL) tablet 50 mg  50 mg Oral QHS Ina Homes, MD       Current Outpatient Medications  Medication Sig Dispense Refill  . atorvastatin (LIPITOR) 20 MG tablet Take 20 mg by mouth daily.   1  . gabapentin (NEURONTIN) 300 MG capsule Take 1 capsule (300 mg total) by mouth 3 (three) times daily. As needed for incisional pain 90 capsule 1  . glimepiride (AMARYL) 4 MG tablet Take 4 mg by mouth daily with breakfast.   0  . hydrochlorothiazide (MICROZIDE) 12.5 MG capsule Take 12.5 mg by mouth daily.    Marland Kitchen ketorolac (TORADOL) 10 MG tablet Take 1 tablet (10  mg total) by mouth every 6 (six) hours as needed (Severe Headache). 20 tablet 0  . LANTUS SOLOSTAR 100 UNIT/ML Solostar Pen Inject 30 Units into the skin daily.     Marland Kitchen levothyroxine (SYNTHROID, LEVOTHROID) 25 MCG tablet Take 25 mcg by mouth daily before breakfast.  1  . metFORMIN (GLUCOPHAGE) 500 MG tablet Take 500-1,000 mg by mouth See admin instructions. 1000mg  in the morning and 500mg  in the evening  1  . naproxen (EC NAPROSYN) 500 MG EC tablet Take 1 tablet (500 mg total) by mouth 2 (two) times daily with a meal. 60 tablet 0  . traZODone (DESYREL) 50 MG tablet Take 50 mg by mouth at bedtime.    . VENTOLIN HFA 108 (90 Base) MCG/ACT inhaler Inhale 2 puffs into the lungs every 6 (six) hours as needed for wheezing or shortness of breath.      (Not in a hospital admission)   Family History  Problem Relation Age of Onset  . CAD Father   . Diabetes Mellitus II Neg Hx      Review of Systems:   Review of Systems  Constitutional: Positive for malaise/fatigue and weight loss. Negative for fever.  Respiratory: Positive for shortness of breath.   Cardiovascular: Negative for leg swelling.  Gastrointestinal: Negative for abdominal pain, nausea and vomiting.  Neurological: Positive for headaches.   Pertinent items are noted in HPI.     Physical Exam: BP 123/74   Pulse (!) 103   Temp (!) 97.5 F (36.4 C) (Oral)   Resp (!) 25   Ht 5\' 11"  (1.803 m)   Wt 68 kg   SpO2 98%   BMI 20.91 kg/m    General appearance: cooperative, pale and ill-appearing Resp: clear to auscultation bilaterally, chest tube rub heard on right side Cardio: sinus tachyardia Extremities: extremities normal, atraumatic, no cyanosis or edema Neurologic: Grossly normal  Diagnostic Studies & Laboratory data:  COVID 19 negative on 12/24/2019     Recent Radiology Findings:   [facility] Chest Port 1 View  Result Date: 01/15/2020 CLINICAL DATA:  Status post chest tube placement. EXAM: PORTABLE CHEST 1 VIEW COMPARISON:   01/15/2020 at 12:54 p.m. FINDINGS: A right chest tube has been placed. The right lung has re-expanded, and there is a tiny residual right apical pneumothorax. Mediastinal shift has resolved. Mild right basilar opacity likely reflects atelectasis. Known cavitary lung lesions are again seen bilaterally including a dominant left lower lobe cavitary mass with surrounding consolidation. No sizable pleural effusion is identified. IMPRESSION: Interval right chest tube placement with re-expansion of the right lung. Tiny residual right apical pneumothorax. Electronically Signed   By: Logan Bores M.D.   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 Mediastinal shift RIGHT to LEFT consistent with tension pneumothorax. Heart size normal. Atherosclerotic calcification aorta. LEFT perihilar opacities represent a combination of infiltrate and cavitary metastases as seen on prior CT. Large mass medial LEFT lower lobe, cavitation seen on prior CT not radiographically discernible. Small LEFT pleural effusion. No osseous lesions. IMPRESSION: Large RIGHT tension pneumothorax with mediastinal shift RIGHT to LEFT. Cavitary metastases LEFT lung including a large LEFT lower lobe mass. Small LEFT pleural effusion. Critical Value/emergent results were called by telephone at the time of interpretation on 01/15/2020 at 1309 hours to provider DR. RACHEL LITTLE , who verbally acknowledged these results. Electronically Signed   By: Lavonia Dana M.D.   On: 01/15/2020 13:12     I have independently reviewed the above  radiologic studies and discussed with the patient   Recent Lab  Findings: Lab Results  Component Value Date   WBC 19.1 (H) 01/15/2020   HGB 12.4 (L) 01/15/2020   HCT 40.1 01/15/2020   PLT 626 (H) 01/15/2020   GLUCOSE 288 (H) 01/15/2020   ALT 26 01/15/2020   AST 19 01/15/2020   NA 132 (L) 01/15/2020   K 4.8 01/15/2020   CL 89 (L) 01/15/2020   CREATININE 0.56 (L) 01/15/2020   BUN 18 01/15/2020   CO2 28 01/15/2020   TSH 2.880 01/25/2019   INR 1.3 (H) 01/24/2019   HGBA1C 13.5 (H) 12/24/2019      Assessment / Plan:      1. Tension pneumothorax with mediastinal shift-right pigtail chest tube placed with almost total resolution of pneumo 2. Metastatic adenocarcinoma-followed by palliative, DNR 3. Recent craniotomy with resection of cerebellar mass and ongoing headaches. CT of the head showed: New bilateral cerebral convexity hypoattenuating subdural collections with associated mass effect including partial effacement of the lateral and third ventricles and mild rightward midline shift. 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 Evolution of immediate postoperative changes as detailed above. No acute intracranial hemorrhage. 4. Uncontrolled diabetes mellitus type 2-current A1C 13.5. last blood glucose level was 220 5. Hypothyroid 6. Dyslipidemia   Plan: Patient is stable with slightly elevated RR but good saturation. Tension pneumo almost completely resolved on CXR. No air leak. Continue chest tube to suction for now. CXR in the morning.     I  spent 15 minutes counseling the patient face to face.   Nicholes Rough, PA-C  01/15/2020 4:06 PM  Patient examined and CXR images pre and post Pigtail catheter placement reviewed Recent craniotomy for metastatic lung cancer Will leave tube to 20 cm suction for 48 hrs then slowly wean to water seal and follow daily CXR  patient examined and medical record reviewed,agree with above note. Tharon Aquas Trigt  III 01/15/2020

## 2020-01-15 NOTE — H&P (Signed)
Date: 01/15/2020               Patient Name:  Gary Hudson MRN: [id number]  DOB: [birth year] Age / Sex: 54 y.o., male   PCP: Cyndi Bender, PA-C         Medical Service: Internal Medicine Teaching Service         Attending Physician: Dr. Velna Ochs, MD    First Contact: Dr. Gilford Rile Pager: [phone]  Second Contact: Dr. 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 History was obtained via the patient and through chart review.   Patient states that he has been having significant N/V since leaving the hospital. He has been unable to tolerate PO intake and therefore his family brought him to the ED. To me, he denies [facility] or chest pain. However, on review of the chart, he presented to the ED with headaches and SHOB. He had to increase his oxygen to requirements to 6L/min [place]. He otherwise denies fevers/chills, chest pain, SHOB, palpitations, abdominal pain, constipation/diarrhea, dysuria, myalgias, arthralgias.   In ED the ED he was found to have a right tension pneumothorax with mediastinal shifting from right to left. Chest tube was placed in the ED and admission was requested.   Meds:  No current facility-administered medications on file prior to encounter.   Current Outpatient Medications on File Prior to Encounter  Medication Sig Dispense Refill  . atorvastatin (LIPITOR) 20 MG tablet Take 20 mg by mouth daily.   1  . gabapentin (NEURONTIN) 300 MG capsule Take 1 capsule (300 mg total) by mouth 3 (three) times daily. As needed for incisional pain 90 capsule 1  . glimepiride (AMARYL) 4 MG  tablet Take 4 mg by mouth daily with breakfast.   0  . hydrochlorothiazide (MICROZIDE) 12.5 MG capsule Take 12.5 mg by mouth daily.    Marland Kitchen ketorolac (TORADOL) 10 MG tablet Take 1 tablet (10 mg total) by mouth every 6 (six) hours as needed (Severe Headache). 20 tablet 0  . LANTUS SOLOSTAR 100 UNIT/ML Solostar Pen Inject 30 Units into the skin daily.     Marland Kitchen levothyroxine (SYNTHROID, LEVOTHROID) 25 MCG tablet Take 25 mcg by mouth daily before breakfast.   1  . metFORMIN (GLUCOPHAGE) 500 MG tablet Take 500-1,000 mg by mouth See admin instructions. 1000mg  in the morning and 500mg  in the evening  1  . naproxen (EC NAPROSYN) 500 MG EC tablet Take 1 tablet (500 mg total) by mouth 2 (two) times daily with a meal. 60 tablet 0  . traZODone (DESYREL) 50 MG tablet Take 50 mg by mouth at bedtime.    . VENTOLIN HFA 108 (90 Base) MCG/ACT inhaler Inhale 2 puffs into the lungs every 6 (six) hours as needed for wheezing or shortness of breath.     Allergies: Allergies as of 01/15/2020  . (No Known Allergies)   Past Medical History:  Diagnosis Date  . Diabetes mellitus without complication ([place])    Family History  Problem Relation Age of Onset  . CAD Father   . Diabetes Mellitus II Neg Hx  Social History   Socioeconomic History  . Marital status: Single    Spouse name: Not on file  . Number of children: Not on file  . Years of education: Not on file  . Highest education level: Not on file  Occupational History  . Not on file  Tobacco Use  . Smoking status: Former Research scientist (life sciences)  . Smokeless tobacco: Never Used  Substance and Sexual Activity  . Alcohol use: Never  . Drug use: Never  . Sexual activity: Not on file  Other Topics Concern  . Not on file  Social History Narrative  . Not on file   Social Determinants of Health   Financial Resource Strain:   . Difficulty of Paying Living Expenses:   Food Insecurity:   . Worried About Charity fundraiser in the Last Year:   . Arboriculturist in the Last  Year:   Transportation Needs:   . Film/video editor (Medical):   Marland Kitchen Lack of Transportation (Non-Medical):   Physical Activity:   . Days of Exercise per Week:   . Minutes of Exercise per Session:   Stress:   . Feeling of Stress :   Social Connections:   . Frequency of Communication with Friends and Family:   . Frequency of Social Gatherings with Friends and Family:   . Attends Religious Services:   . Active Member of Clubs or Organizations:   . Attends Archivist Meetings:   Marland Kitchen Marital Status:   Intimate Partner Violence:   . Fear of Current or Ex-Partner:   . Emotionally Abused:   Marland Kitchen Physically Abused:   . Sexually Abused:    Review of Systems: A complete ROS was negative except as per HPI.   Physical Exam: Blood pressure 123/74, pulse (!) 103, temperature (!) 97.5 F (36.4 C), temperature source Oral, resp. rate (!) 25, height 5\' 11"  (1.803 m), weight 68 kg, SpO2 98 %.  General: Cachectic male in no acute distress HENT: Normocephalic, atraumatic, moist mucus membranes Pulm: Good air movement with diminished air movement on the right  CV: Tachycardic with regular rhythm, no murmurs, no rubs  Abdomen: Active bowel sounds, soft, non-distended, no tenderness to palpation  Extremities: Pulses palpable in all extremities, no LE edema  Skin: Warm and dry  Neuro: Alert and oriented x 3  EKG: personally reviewed my interpretation is sinus tachycardia   CXR: personally reviewed my interpretation is right pneumothorax with right to left shifting. Infiltrates with cavitary lesions on the left.   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 CXR illustrated a right pneumothorax with right to left mediastinal  shifting. Chest tube was placed in the ED and admission was requested for further evaluation/management.   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  SignedIna Homes, MD 01/15/2020, 4:02 PM  Pager: [phone]

## 2020-01-15 NOTE — ED Notes (Signed)
Pt hard stick, multiple attempts by this RN, but no success. IV consult put in

## 2020-01-15 NOTE — ED Provider Notes (Signed)
[place] EMERGENCY DEPARTMENT Provider Note   CSN: [id number] Arrival date & time: 01/15/20  1215     History Chief Complaint  Patient presents with  . Headache  . Shortness of Breath    Gary Hudson is a 54 y.o. male.  54yo M w/ h/o metastatic lung adenocarcinoma on home O2, HTN who p/w SOB.  The patient was just discharged from the hospital after a craniotomy to resect cerebellar mass found to be metastatic disease.  He notes that he has been having headaches ever since his surgery and discharge home.  He reports worsening shortness of breath and having to turn up his home oxygen to 6 L today.  He denies any chest pain.  LEVEL 5 CAVEAT DUE TO SEVERE RESPIRATORY DISTRESS  The history is provided by the patient. The history is limited by the condition of the patient.  Headache Shortness of Breath Associated symptoms: headaches        Past Medical History:  Diagnosis Date  . Diabetes mellitus without complication [facility])     Patient Active Problem List   Diagnosis Date Noted  . SOB (shortness of breath)   . HCAP (healthcare-associated pneumonia)   . Adult failure to thrive   . Nonintractable headache   . Palliative care by specialist   . DNR (do not resuscitate)   . Weakness generalized   . Adenocarcinoma, metastatic ([place]) 01/05/2020  . Lung cancer metastatic to brain ([place])   . Protein-calorie malnutrition, severe 01/03/2020  . History of pleural effusion 12/25/2019  . Dyslipidemia 12/25/2019  . Cerebellar mass 12/25/2019  . Hypothyroid 12/25/2019  . Normocytic anemia 12/25/2019  . Unintentional weight loss 12/25/2019  . Hemoptysis 12/25/2019  . Dysphagia 12/25/2019  . Cavitary lesion of lung 12/24/2019  . Poor dentition 01/30/2019  . Status post thoracotomy 01/26/2019  . Uncontrolled type 2 diabetes mellitus with hyperglycemia ([place]) 01/24/2019  . Essential hypertension 01/24/2019    Past Surgical History:  Procedure Laterality Date    . BRONCHIAL BRUSHINGS  12/26/2019   Procedure: BRONCHIAL BRUSHINGS;  Surgeon: Spero Geralds, MD;  Location: [facility] ENDOSCOPY;  Service: Pulmonary;;  . BRONCHIAL WASHINGS  12/26/2019   Procedure: BRONCHIAL WASHINGS;  Surgeon: Spero Geralds, MD;  Location: [facility] ENDOSCOPY;  Service: Pulmonary;;  . CRANIOTOMY Right 01/05/2020   Procedure: Right Suboccipital craniectomy for tumor;  Surgeon: Ashok Pall, MD;  Location: [address];  Service: Neurosurgery;  Laterality: Right;  . TEE WITHOUT CARDIOVERSION N/A 01/26/2019   Procedure: TRANSESOPHAGEAL ECHOCARDIOGRAM (TEE);  Surgeon: Prescott Gum, Collier Salina, MD;  Location: [facility] OR;  Service: Thoracic;  Laterality: N/A;  . VIDEO ASSISTED THORACOSCOPY (VATS)/EMPYEMA Left 01/26/2019   Procedure: VIDEO ASSISTED THORACOSCOPY (VATS)/DRAINAGE OF EMPYEMA  AND DECORTICATION;  Surgeon: Ivin Poot, MD;  Location: [place];  Service: Thoracic;  Laterality: Left;  Marland Kitchen VIDEO BRONCHOSCOPY N/A 01/26/2019   Procedure: VIDEO BRONCHOSCOPY;  Surgeon: Prescott Gum, Collier Salina, MD;  Location: [place];  Service: Thoracic;  Laterality: N/A;  . VIDEO BRONCHOSCOPY N/A 12/26/2019   Procedure: VIDEO BRONCHOSCOPY WITHOUT FLUORO;  Surgeon: Spero Geralds, MD;  Location: [facility] ENDOSCOPY;  Service: Pulmonary;  Laterality: N/A;       Family History  Problem Relation Age of Onset  . CAD Father   . Diabetes Mellitus II Neg Hx     Social History   Tobacco Use  . Smoking status: Former Research scientist (life sciences)  . Smokeless tobacco: Never Used  Substance Use Topics  . Alcohol use: Never  .  Drug use: Never    Home Medications Prior to Admission medications   Medication Sig Start Date End Date Taking? Authorizing Provider  atorvastatin (LIPITOR) 20 MG tablet Take 20 mg by mouth daily.  04/13/17   [provider]  gabapentin (NEURONTIN) 300 MG capsule Take 1 capsule (300 mg total) by mouth 3 (three) times daily. As needed for incisional pain 03/02/19   Prescott Gum, Collier Salina, MD  glimepiride (AMARYL) 4 MG tablet Take 4 mg by mouth  daily with breakfast.  04/13/17   [provider]  hydrochlorothiazide (MICROZIDE) 12.5 MG capsule Take 12.5 mg by mouth daily. 11/02/19   [provider]  ketorolac (TORADOL) 10 MG tablet Take 1 tablet (10 mg total) by mouth every 6 (six) hours as needed (Severe Headache). 01/12/20   Maudie Mercury, MD  LANTUS SOLOSTAR 100 UNIT/ML Solostar Pen Inject 30 Units into the skin daily.  12/11/19   [provider]  levothyroxine (SYNTHROID, LEVOTHROID) 25 MCG tablet Take 25 mcg by mouth daily before breakfast.  04/13/17   [provider]  metFORMIN (GLUCOPHAGE) 500 MG tablet Take 500-1,000 mg by mouth See admin instructions. 1000mg  in the morning and 500mg  in the evening 04/13/17   [provider]  naproxen (EC NAPROSYN) 500 MG EC tablet Take 1 tablet (500 mg total) by mouth 2 (two) times daily with a meal. 01/12/20 01/11/21  Maudie Mercury, MD  traZODone (DESYREL) 50 MG tablet Take 50 mg by mouth at bedtime. 09/22/19   [provider]  VENTOLIN HFA 108 (90 Base) MCG/ACT inhaler Inhale 2 puffs into the lungs every 6 (six) hours as needed for wheezing or shortness of breath. 12/11/19   [provider]    Allergies    Patient has no known allergies.  Review of Systems   Review of Systems  Unable to perform ROS: Severe respiratory distress  Respiratory: Positive for shortness of breath.   Neurological: Positive for headaches.    Physical Exam Updated Vital Signs BP 110/82   Pulse (!) 103   Temp (!) 97.5 F (36.4 C) (Oral)   Resp (!) 25   Ht 5\' 11"  (1.803 m)   Wt 68 kg   SpO2 98%   BMI 20.91 kg/m   Physical Exam Vitals and nursing note reviewed.  Constitutional:      General: He is in acute distress.     Comments: Thin, chronically ill appearing  HENT:     Head: Normocephalic and atraumatic.  Eyes:     Conjunctiva/sclera: Conjunctivae normal.     Pupils: Pupils are equal, round, and reactive to light.  Cardiovascular:     Rate  and Rhythm: Regular rhythm. Tachycardia present.     Heart sounds: Normal heart sounds. No murmur.  Pulmonary:     Effort: Respiratory distress present.     Comments: Respiratory distress w/ tachypnea and accessory muscle use, severely diminished BS R lung Abdominal:     General: Bowel sounds are normal. There is no distension.     Palpations: Abdomen is soft.     Tenderness: There is no abdominal tenderness.  Musculoskeletal:     Cervical back: Neck supple.  Skin:    General: Skin is warm and dry.  Neurological:     Mental Status: He is alert and oriented to person, place, and time.     Comments: Fluent speech  Psychiatric:        Mood and Affect: Mood is anxious.  Judgment: Judgment normal.     ED Results / Procedures / Treatments   Labs (all labs ordered are listed, but only abnormal results are displayed) Labs Reviewed  COMPREHENSIVE METABOLIC PANEL - Abnormal; Notable for the following components:      Result Value   Sodium 132 (*)    Chloride 89 (*)    Glucose, Bld 288 (*)    Creatinine, Ser 0.56 (*)    Albumin 2.2 (*)    Alkaline Phosphatase 207 (*)    All other components within normal limits  CBC WITH DIFFERENTIAL/PLATELET - Abnormal; Notable for the following components:   WBC 19.1 (*)    Hemoglobin 12.4 (*)    RDW 15.9 (*)    Platelets 626 (*)    Neutro Abs 15.6 (*)    Abs Immature Granulocytes 0.28 (*)    All other components within normal limits  LACTIC ACID, PLASMA - Abnormal; Notable for the following components:   Lactic Acid, Venous 2.2 (*)    All other components within normal limits  SARS CORONAVIRUS 2 BY RT PCR (HOSPITAL ORDER, [place] [facility])  LACTIC ACID, PLASMA  TROPONIN I (HIGH SENSITIVITY)  TROPONIN I (HIGH SENSITIVITY)    EKG None  Radiology DG Chest Port 1 View  Result Date: 01/15/2020 CLINICAL DATA:  Status post chest tube placement. EXAM: PORTABLE CHEST 1 VIEW COMPARISON:  01/15/2020 at 12:54 p.m.  FINDINGS: A right chest tube has been placed. The right lung has re-expanded, and there is a tiny residual right apical pneumothorax. Mediastinal shift has resolved. Mild right basilar opacity likely reflects atelectasis. Known cavitary lung lesions are again seen bilaterally including a dominant left lower lobe cavitary mass with surrounding consolidation. No sizable pleural effusion is identified. IMPRESSION: Interval right chest tube placement with re-expansion of the right lung. Tiny residual right apical pneumothorax. Electronically Signed   By: Logan Bores M.D.   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 Mediastinal shift RIGHT to LEFT consistent with tension pneumothorax. Heart size normal. Atherosclerotic calcification aorta. LEFT perihilar opacities represent a combination of infiltrate and cavitary metastases as seen on prior CT. Large mass medial LEFT lower lobe, cavitation seen on prior CT not radiographically discernible. Small LEFT pleural effusion. No osseous lesions. IMPRESSION: Large RIGHT tension pneumothorax with mediastinal shift RIGHT to LEFT. Cavitary metastases LEFT lung including a large LEFT lower lobe mass. Small LEFT pleural effusion. Critical Value/emergent results were called by telephone at the time of interpretation on 01/15/2020 at 1309 hours to provider DR. Camden Knotek , who verbally acknowledged these results. Electronically Signed   By: Lavonia Dana M.D.   On: 01/15/2020 13:12    Procedures .Critical Care Performed by: Sharlett Iles, MD Authorized by: Sharlett Iles, MD   Critical care provider  statement:    Critical care time (minutes):  30   Critical care time was exclusive of:  Separately billable procedures and treating other patients   Critical care was necessary to treat or prevent imminent or life-threatening deterioration of the following conditions:  Respiratory failure (tension pneumothorax)   Critical care was time spent personally by me on the following activities:  Development of treatment plan with patient or surrogate, discussions with consultants, evaluation of patient's response to treatment, obtaining history from patient or surrogate, ordering and performing treatments and interventions, ordering and review of laboratory studies, ordering and review of radiographic studies, re-evaluation of patient's condition and review of old charts CHEST TUBE INSERTION  Date/Time: 01/15/2020 3:19 PM Performed by: Sharlett Iles, MD Authorized by: Sharlett Iles, MD   Consent:    Consent obtained:  Written   Consent given by:  Patient   Risks discussed:  Bleeding, incomplete drainage, pain, nerve damage and damage to surrounding structures   Alternatives discussed:  Alternative treatment Pre-procedure details:    Skin preparation:  ChloraPrep   Preparation: Patient was prepped and draped in the usual sterile fashion   Anesthesia (see MAR for exact dosages):    Anesthesia method:  Local infiltration   Local anesthetic:  Lidocaine 1% w/o epi Procedure details:    Placement location:  R lateral   Scalpel size:  11   Tube size (Fr):  Minicatheter   Ultrasound guidance: no     Tension pneumothorax: yes     Tube connected to:  Suction   Drainage characteristics:  Air only   Suture material: 3-0 silk.   Dressing:  4x4 sterile gauze Post-procedure details:    Post-insertion x-ray findings: tube in good position     Patient tolerance of procedure:  Tolerated well, no immediate complications Comments:     Placed pigtail catheter using Seldinger technique    (including critical care time)  Medications Ordered in ED Medications  albuterol (VENTOLIN HFA) 108 (90 Base) MCG/ACT inhaler 8 puff (has no administration in time range)  ipratropium (ATROVENT HFA) inhaler 2 puff (has no administration in time range)  lidocaine-EPINEPHrine (XYLOCAINE W/EPI) 2 %-1:200000 (PF) injection 20 mL (20 mLs Intradermal Given by Other 01/15/20 1342)  fentaNYL (SUBLIMAZE) injection 100 mcg (100 mcg Intravenous Given 01/15/20 1358)    ED Course  I have reviewed the triage vital signs and the nursing notes.  Pertinent labs & imaging results that were available during my care of the patient were reviewed by me and considered in my medical decision making (see chart for details).    MDM Rules/Calculators/A&P                      Pt in respiratory distress on arrival w/ tachypnea, mild tachycardia, O2 sat mid 90s on 6L [place]. CXR shows tension PTX on R with mediastinal shift. Immediately obtained consent and placed pigtail chest tube with improvement in oxygenation and work of breathing. Repeat CXR shows significant improvement. Labs show lactate 2.2, normal trop, WBC 19.1. I have ordered screening head CT but low suspicion for acute process as headache has been ongoing since surgery. Discussed w/ scheduler for thoracic surgery, Thurmond Butts, who will contact Dr. Prescott Gum to follow pt along in hospital for chest tube management. Discussed admission w/ Dr. Darrick Meigs, Internal med teaching service.  Final Clinical Impression(s) / ED Diagnoses Final diagnoses:  Tension pneumothorax, spontaneous  Chronic intractable headache, unspecified headache type    Rx / DC Orders ED Discharge Orders    None       Zyaire Mccleod, Wenda Overland, MD 01/15/20 1520

## 2020-01-15 NOTE — ED Notes (Signed)
Pt's heart rate up to 160's   EKG appears to be A-Fib RVR.  Admitting MD paged.  Dr. Debbora Dus made aware for possible recommendations.

## 2020-01-15 NOTE — ED Notes (Signed)
Pt resting with eyes closed.  No questions asked

## 2020-01-15 NOTE — Telephone Encounter (Signed)
Palliative Medicine RN Note: Kieth Brightly called the PMT main line and reported that Mr Seiden is on the way back to the hospital. She told our team member who answered the phone that she wants [place] to see him again; Erin instructed her to ask his MD, as we cannot see patients without a physician order. I will watch for his arrival to see how/if we can help.  Marjie Skiff Kymani Shimabukuro, RN, BSN, [facility] Palliative Medicine Team 01/15/2020 12:19 PM Office [phone]

## 2020-01-16 ENCOUNTER — Other Ambulatory Visit: Payer: Self-pay

## 2020-01-16 ENCOUNTER — Inpatient Hospital Stay (HOSPITAL_COMMUNITY): Payer: 59

## 2020-01-16 DIAGNOSIS — J9311 Primary spontaneous pneumothorax: Secondary | ICD-10-CM

## 2020-01-16 DIAGNOSIS — C7931 Secondary malignant neoplasm of brain: Secondary | ICD-10-CM

## 2020-01-16 DIAGNOSIS — C3490 Malignant neoplasm of unspecified part of unspecified bronchus or lung: Secondary | ICD-10-CM

## 2020-01-16 DIAGNOSIS — Z515 Encounter for palliative care: Secondary | ICD-10-CM

## 2020-01-16 DIAGNOSIS — G8929 Other chronic pain: Secondary | ICD-10-CM

## 2020-01-16 DIAGNOSIS — Z66 Do not resuscitate: Secondary | ICD-10-CM

## 2020-01-16 DIAGNOSIS — R519 Headache, unspecified: Secondary | ICD-10-CM

## 2020-01-16 LAB — CBG MONITORING, ED
Glucose-Capillary: 173 mg/dL — ABNORMAL HIGH (ref 70–99)
Glucose-Capillary: 170 mg/dL — ABNORMAL HIGH (ref 70–99)

## 2020-01-16 LAB — BASIC METABOLIC PANEL
Anion gap: 12 (ref 5–15)
BUN: 13 mg/dL (ref 6–20)
CO2: 28 mmol/L (ref 22–32)
Calcium: 8.6 mg/dL — ABNORMAL LOW (ref 8.9–10.3)
Chloride: 92 mmol/L — ABNORMAL LOW (ref 98–111)
Creatinine, Ser: 0.45 mg/dL — ABNORMAL LOW (ref 0.61–1.24)
GFR calc Af Amer: >60 mL/min (ref >60)
GFR calc non Af Amer: >60 mL/min (ref >60)
Glucose, Bld: 194 mg/dL — ABNORMAL HIGH (ref 70–99)
Potassium: 4.1 mmol/L (ref 3.5–5.1)
Sodium: 132 mmol/L — ABNORMAL LOW (ref 135–145)

## 2020-01-16 LAB — TROPONIN I (HIGH SENSITIVITY): Troponin I (High Sensitivity): 12 ng/L (ref <18)

## 2020-01-16 LAB — LACTIC ACID, PLASMA: Lactic Acid, Venous: 1 mmol/L (ref 0.5–1.9)

## 2020-01-16 MED ORDER — HYDROMORPHONE HCL 1 MG/ML IJ SOLN
INTRAMUSCULAR | Status: AC
  Filled 2020-01-16: qty 1

## 2020-01-16 MED ORDER — HYDROMORPHONE HCL 2 MG PO TABS: 2.0000 mg | ORAL_TABLET | ORAL | Status: DC | PRN

## 2020-01-16 NOTE — Patient Outreach (Signed)
[facility]) Care Management  01/11/2020  Gary Hudson 17-May-1966 [id number]   Case closure:  Patient returned to the ED yesterday with a pneumothorax. Palliative care consult and patient was discharged home with hospice care.   PLAN: case closure due to enrollment in external program.   Tomasa Rand, RN, BSN, CEN [name] Coordinator [phone]

## 2020-01-16 NOTE — Progress Notes (Signed)
[facility]:  Gary Hudson   Verified with [employer] [facility] that pt had a 61 Liter concentrator in home as pt's sister seemed to think it was. It is not a 10L concentrator so we are having this switched out so that he will have the needed amt of oxygen once he gets home. I have ordered it to happen stat. Pt sister has also decided that it would be better for pt to go home by ambulance instead of car. I have left message for CM to notify her these things to help coordinate d/c plan home Webb Silversmith RN

## 2020-01-16 NOTE — Discharge Summary (Signed)
Name: Gary Hudson MRN: [id number] DOB: 1966-07-06 54 y.o. PCP: Cyndi Bender, PA-C  Date of Admission: 01/15/2020 12:15 PM Date of Discharge: 01/08/2020 Attending Physician: Velna Ochs, MD  Discharge Diagnosis: 1. Right Tension Pneumothorax  2. Atrial Fibrillation Secondary to Tension Pneumothorax  3. 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 Uncontrolled Type II Diabetes 5. Hypertension  6. Hypothyroidism   Discharge Medications: Allergies as of 01/02/2020   No Known Allergies     Medication List    TAKE these medications   gabapentin 300 MG capsule Commonly known as: Neurontin Take 1 capsule (300 mg total) by mouth 3 (three) times daily. As needed for incisional pain   glimepiride 4 MG tablet Commonly known as: AMARYL Take 4 mg by mouth daily with breakfast.   ketorolac 10 MG tablet Commonly known as: TORADOL Take 1 tablet (10 mg total) by mouth every 6 (six) hours as needed (Severe Headache).   Lantus SoloStar 100 UNIT/ML Solostar Pen Generic drug: insulin glargine Inject 15-20 Units into the skin daily.   metFORMIN 500 MG tablet Commonly known as: GLUCOPHAGE Take 500-1,000 mg by mouth See admin instructions. 1000mg  in the morning and 500mg  in the evening   naproxen 500 MG EC tablet Commonly known as: EC NAPROSYN Take 1 tablet (500 mg total) by mouth 2 (two) times daily with a meal.       Disposition and follow-up:   Mr.Gary Hudson was discharged from [facility] in Stable condition.  At the hospital follow up visit please address:  1.  Chest Tube Management, Hospice Services  2.  Labs / imaging needed at time of follow-up: None  3.  Pending labs/ test needing follow-up: None  Follow-up Appointments: Follow-up Information    Prescott Gum, Collier Salina, MD. Schedule an appointment as soon as possible for a visit in 1 week(s).   Specialty:  Cardiothoracic Surgery Why:  Please call in 1 week for an appointment on your chest tube management. Contact information: [address] [place] [address] [place] [facility] Course by problem list: 1. Right Tension Pneumothorax:  Patient presented from home with shortness of breath and increased O2 requirements of 6L. During imaging of his chest he was found to have near complete collapse of his right lung with mediastinal right to left shift. He was treated emergently with a chest tube with serial xray images showing improvement of pneumothorax. Cardiothoracic surgery has been managing his chest tube. Patient and family discussed their goals of care with palliative medicine and have decided on going home with hospice care. Chest tube will stay inserted and to water seal. Patient will follow up with cardiothoracic surgery for removal in the outpatient setting. Patient was discharged home with hospice care provided by [facility] [employer].   2. Atrial Fibrillation Secondary to Tension Pneumothorax: Patient arrived to the emergency room in atrial fibrillation secondary to his tension pneumothorax. Patient was managed on a diltiazem drip during hospital course. Patient discussed with palliative care and has chosen to be discharged to home with hospice care. Patient was discharged in stable condition.       3. 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 Patient was to follow with oncology in [facility] for treatment options. Patient discussed goals of care with palliative and has chosen to be discharge with home hospice.   4. Uncontrolled Type II Diabetes: Patient presenting with uncontrolled type II diabetes on  metformin, glimepiride, and Lantus with a A1c of 13.5%. He was managed on sliding scale insulin and 25 units of Lantus  5. Hypertension: Patient with history of hypertension presented to the emergency department with right tension pneumothorax. Due to his low/labile pressures his home medications of losartan and HCTZ were held during his hospital course.   6. Hypothyroidism:  Patient with history of hypothyroidism was managed with his home dose of levothyroxine 25 mcg.   Discharge Vitals:   BP 123/68   Pulse 84   Temp (!) 97.3 F (36.3 C) (Axillary)   Resp (!) 22   Ht 5\' 11"  (1.803 m)   Wt 68 kg   SpO2 95%   BMI 20.91 kg/m   Pertinent Labs, Studies, and Procedures:   EXAM: PORTABLE CHEST 1 VIEW  COMPARISON:  Portable exam 1254 hours compared to 01/06/2020  FINDINGS: Large RIGHT pneumothorax with near complete collapse of the RIGHT lung. Mediastinal shift RIGHT to LEFT consistent with tension pneumothorax. Heart size normal. Atherosclerotic calcification aorta.  LEFT perihilar opacities represent a combination of infiltrate and cavitary metastases as seen on prior CT.  Large mass medial LEFT lower lobe, cavitation seen on prior CT not radiographically discernible. Small LEFT pleural effusion. No osseous lesions.  IMPRESSION: Large RIGHT tension pneumothorax with mediastinal shift RIGHT to LEFT. Cavitary metastases LEFT lung including a large LEFT lower lobe mass. Small LEFT pleural effusion. Critical Value/emergent results were called by telephone at the time of interpretation on 01/15/2020 at 1309 hours to provider DR. RACHEL LITTLE , who verbally acknowledged these results.   EXAM: CT HEAD WITHOUT CONTRAST  TECHNIQUE: Contiguous axial images were obtained from the base of the skull through the vertex without intravenous contrast.  COMPARISON:  01/09/2020 FINDINGS: Brain: Evolution of immediate postoperative changes. Right cerebellar resection  cavity as decreased in size. Probable small areas of infarction adjacent to the surgical tract. There is no acute intracranial hemorrhage or increased mass effect in the posterior fossa. new bilateral hypoattenuating subdural collections measuring up to 1 cm bilaterally on coronal imaging. There is partial effacement of the lateral and third ventricles and 3 mm of rightward midline shift. No new loss of gray-white differentiation. Vascular: No new finding. Skull: Unremarkable apart from suboccipital craniectomy Sinuses/Orbits: No acute finding. Other: Partially imaged fluid collection in the suboccipital soft tissues extending into the neck.  IMPRESSION: New bilateral cerebral convexity hypoattenuating subdural collections with associated mass effect including partial effacement of the lateral and third ventricles and mild rightward midline shift. 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 Evolution of immediate postoperative changes as detailed above. No acute intracranial hemorrhage.  CHEST TUBE INSERTION  Date/Time: 01/15/2020 3:19 PM Performed by: Sharlett Iles, MD Authorized by: Sharlett Iles, MD   Consent:    Consent obtained:  Written   Consent given by:  Patient   Risks discussed:  Bleeding, incomplete drainage, pain, nerve damage and damage to surrounding structures   Alternatives discussed:  Alternative treatment Pre-procedure details:    Skin preparation:  ChloraPrep   Preparation: Patient was prepped and draped in the usual sterile fashion   Anesthesia (see MAR for  exact dosages):    Anesthesia method:  Local infiltration   Local anesthetic:  Lidocaine 1% w/o epi Procedure details:    Placement location:  R lateral   Scalpel size:  11   Tube size (Fr):  Minicatheter   Ultrasound guidance: no     Tension pneumothorax: yes     Tube connected to:  Suction    Drainage characteristics:  Air only   Suture material: 3-0 silk.   Dressing:  4x4 sterile gauze Post-procedure details:    Post-insertion x-ray findings: tube in good position     Patient tolerance of procedure:  Tolerated well, no immediate complications Comments:     Placed pigtail catheter using Seldinger technique  EXAM: PORTABLE CHEST 1 VIEW  COMPARISON:  01/15/2019  FINDINGS: Cardiac shadow is prominent but stable. Aortic calcifications are again seen. Cavitary lesions are again noted bilaterally worse on the left than the right. Pigtail catheter is again seen on the right. The previously noted tiny pneumothorax has resolved in the interval. No bony abnormality is seen.  IMPRESSION: Resolution of tiny right pneumothorax.  Stable cavitary lesions bilaterally.  Discharge Instructions: Discharge Instructions    Diet - low sodium heart healthy   Complete by: As directed    Increase activity slowly   Complete by: As directed       Signed: Maudie Mercury, MD 01/01/2020, 1:48 PM   Pager: [phone]

## 2020-01-16 NOTE — Progress Notes (Addendum)
Subjective:  O/N Events: None  Mr. Devaul was seen at bedside this morning. He states that he is having a difficult time breathing right now. He continues to have a headache and some nausea. He is having some chest pain around the chest tube site. Otherwise he denies any other symptoms.  Objective:  Vital signs in last 24 hours: Vitals:   01/19/2020 0440 12/24/2019 0450 01/01/2020 0500 01/09/2020 0534  BP: 125/69 102/70 111/78 (!) 87/73  Pulse: 72 (!) 102 96 91  Resp: 20 (!) 28 17 (!) 21  Temp:    (!) 97.3 F (36.3 C)  TempSrc:    Axillary  SpO2: 97% 98% 96% 96%  Weight:      Height:       Physical Exam HENT:     Head: Normocephalic and atraumatic.  Cardiovascular:     Rate and Rhythm: Normal rate and regular rhythm.  Pulmonary:     Comments: tachypneic with coarse breath sounds noted bilaterally.  Chest:     Chest wall: No tenderness.  Abdominal:     General: Abdomen is flat. Bowel sounds are normal.     Tenderness: There is no abdominal tenderness.  Musculoskeletal:     Right lower leg: No edema.     Left lower leg: No edema.  Neurological:     Mental Status: He is alert.     CBC Latest Ref Rng & Units 01/15/2020 01/11/2020 01/10/2020  WBC 4.0 - 10.5 K/uL 19.1(H) 14.9(H) 18.1(H)  Hemoglobin 13.0 - 17.0 g/dL 12.4(L) 10.0(L) 10.2(L)  Hematocrit 39.0 - 52.0 % 40.1 31.9(L) 32.6(L)  Platelets 150 - 400 K/uL 626(H) 462(H) 471(H)   BMP Latest Ref Rng & Units 01/14/2020 01/15/2020 01/11/2020  Glucose 70 - 99 mg/dL 194(H) 288(H) 236(H)  BUN 6 - 20 mg/dL 13 18 15   Creatinine 0.61 - 1.24 mg/dL 0.45(L) 0.56(L) 0.49(L)  Sodium 135 - 145 mmol/L 132(L) 132(L) 130(L)  Potassium 3.5 - 5.1 mmol/L 4.1 4.8 5.1  Chloride 98 - 111 mmol/L 92(L) 89(L) 92(L)  CO2 22 - 32 mmol/L 28 28 28   Calcium 8.9 - 10.3 mg/dL 8.6(L) 9.0 8.2(L)     Assessment/Plan:  Active Problems:   Pneumothorax  Gary Hudson is a 54 y.o male with recently diagnosed Stage IV squamous cell carcinoma of the lungs  s/p craniotomy and resection of a metastatic right cerebellum mass who presented to the ED with worsening SHOB and increasing oxygen requirement. CXR illustrated a right pneumothorax with right to left mediastinal shifting. Chest tube was placed in the ED and admission was requested for further evaluation/management.   Right Tension Pneumothorax:  S/p right chest tube placement with improvement in right pneumothorax and minimal residual pneumothorax. Patient has discussed with palliative care and would like to be discharged with home hospice. Chest tube will stay in place and be withdrawn by CT surgery in the outpatient setting. Patient is full comfort care.  - Continue oxycodone and IV dilaudid PRN for pain management   A-Fib RVR secondary to tension pneumothorax  - Normal LVEF on last echocardiogram  - Dilt drip, HR targert of <110  Stage IV squamous cell carcinoma of the lungs s/p craniotomy and resection of a metastatic right cerebellum mass - PRN albuterol and atrovent   Type II Diabetes - Uncontrolled - Lantus 25 units QD + SSI  Hypertension: - Hold losartan 50mg  QD and HCTZ 12.5mg  QD  Hypothyroidism - Continue Levothyroxine 38mcg QD  Prior to Admission Living Arrangement: Anticipated Discharge Location:  Barriers to Discharge: Dispo: Anticipated discharge in approximately Today.   Maudie Mercury, MD 12/27/2019, 6:06 AM Pager: [phone]

## 2020-01-16 NOTE — ED Notes (Signed)
R pigtail chest tube connected to mini sahara chamber for d/c

## 2020-01-16 NOTE — Discharge Instructions (Signed)
To Gary Hudson,  It was a pleasure taking care of you during your stay at [facility]. During your stay your lung collapsed and a tube was placed to allow for your lung to expand. You will be discharged to home hospice with the tube will follow with cardiothoracic surgery for management of your chest tube.

## 2020-01-16 NOTE — Discharge Planning (Signed)
RNCM consulted  by Palliative NP regarding home hospice referral.  RNCM coordinating with with [facility] rep, Webb Silversmith to help pt transfer home.

## 2020-01-16 NOTE — Discharge Planning (Signed)
Pt accepted by [facility] for home hospice services.  Pt sister, who is at bedside will transport pt home when discharged.

## 2020-01-16 NOTE — Progress Notes (Signed)
[facility]: Leighton to the pt's sister and introduced her to hospice care. She states that this is the pt's wishes to focus on comfort and go home. They have oxygen in home already it is a 10 liter concentrator.. I have ordered a hospital bed to be delivered. They already have a walker and BSC in place as well. He has been approved for hospice care by our MD and we plan to be in home today at 230pm to enroll into hospice services.   Webb Silversmith RN [phone]

## 2020-01-16 NOTE — ED Notes (Signed)
Please call Avonte Sensabaugh (sister) states she has POA @ [phone] for a status update--Leslie

## 2020-01-16 NOTE — Progress Notes (Signed)
  Subjective: Breathing comfortably with R chest tube in place CXR today reviewed- lung re-expanded No air leak- tube was been on  water seal- suction has been off Will be safe to let patient go home with chest tube to water seal and I will follow in office and remove when appropriate Objective: Vital signs in last 24 hours: Temp:  [97.2 F (36.2 C)-97.5 F (36.4 C)] 97.3 F (36.3 C) (05/25 0534) Pulse Rate:  [29-157] 87 (05/25 0900) Resp:  [12-38] 17 (05/25 0900) BP: (87-126)/(56-102) 111/69 (05/25 0900) SpO2:  [90 %-100 %] 94 % (05/25 0900) Weight:  [68 kg] 68 kg (05/24 1255)  Hemodynamic parameters for last 24 hours:    Intake/Output from previous day: No intake/output data recorded. Intake/Output this shift: No intake/output data recorded.       Exam    General- alert and comfortable    Neck- no JVD, no cervical adenopathy palpable, no carotid bruit, no crepitus   Lungs- clear without rales, wheezes. R chest tube secure   Cor- regular rate and rhythm, no murmur , gallop   Abdomen- soft, non-tender   Extremities - warm, non-tender, minimal edema   Neuro- some aphasia noted   Lab Results: Recent Labs    01/15/20 1245  WBC 19.1*  HGB 12.4*  HCT 40.1  PLT 626*   BMET:  Recent Labs    01/15/20 1245 01/05/2020 0526  NA 132* 132*  K 4.8 4.1  CL 89* 92*  CO2 28 28  GLUCOSE 288* 194*  BUN 18 13  CREATININE 0.56* 0.45*  CALCIUM 9.0 8.6*    PT/INR: No results for input(s): LABPROT, INR in the last 72 hours. ABG    Component Value Date/Time   PHART 7.434 01/27/2019 0350   HCO3 28.3 (H) 01/27/2019 0350   TCO2 31 01/26/2019 1002   O2SAT 95.7 01/27/2019 0350   CBG (last 3)  Recent Labs    01/15/20 2233 01/03/2020 0546 12/29/2019 0722  GLUCAP 239* 173* 170*    Assessment/Plan: S/P  chest tube to water seal connect chest tube to mini-sahara for home use Ok to send home with chest tube- will follow as outpatient   LOS: 1 day    Tharon Aquas Trigt  III 12/29/2019

## 2020-01-16 NOTE — ED Notes (Signed)
Lunch Tray Ordered @ 8118.

## 2020-01-16 NOTE — Consult Note (Signed)
Consultation Note Date: 12/27/2019   Patient Name: Gary Hudson  DOB: 09/14/1965  MRN: [id number]  Age / Sex: 54 y.o., male  PCP: Gary Bender, PA-C Referring Physician: Velna Ochs, MD  Reason for Consultation: Establishing goals of care, Pain control and Psychosocial/spiritual support  HPI/Patient Profile: 54 y.o. male  admitted on 01/15/2020 with PMH significant for  metastatic lung adenocarcinoma on home O2, HTN who p/w SOB.  The patient was just discharged from the hospital after a craniotomy to resect cerebellar mass found to be metastatic disease.  He notes that he has been having headaches ever since his surgery and discharge home.  He reports worsening shortness of breath and having to turn up his home oxygen to 6 L today.  He denies any chest pain.  Patient has anticipating outpatient follow-up with medical oncology.  Patient and his family face treatment option decisions, advanced directive decisions and anticipatory care needs.  Clinical Assessment and Goals of Care:  This NP Gary Hudson reviewed medical records, received report from team, assessed the patient and then meet at the patient's bedside/in the ER along with his sister/Gary Hudson to discuss diagnosis, prognosis, GOC, EOL wishes disposition and options.   Patient and family worked with the palliative medicine team during his recent hospitalization.  Detailed goals of care discussions were had at that time.     Again today a discussion was had today regarding advanced directives.  Concepts specific to code status, artifical feeding and hydration, continued IV antibiotics and rehospitalization was had.  The difference between a aggressive medical intervention path  and a palliative comfort care path for this patient at this time was had.  Values and goals of care important to patient and family were attempted to be elicited.  Created  space and opportunity for patient and his family to explore their thoughts and feelings regarding current medical situation.  Patient was able to verbalize that he understands his limited prognosis and that his main goal is for comfort and dignity at this time.  He does not wish to follow-up for further work-up or treatment for his cancer diagnosis and hopes to be discharged home as soon as possible to be with his family.  Emotional support offered   MOST form completed to reflect full comfort   Natural trajectory and expectations at EOL were discussed.  Questions and concerns addressed.  Patient  encouraged to call with questions or concerns.     PMT will continue to support holistically.         Healthcare power of attorney/patient's Sister Gary Hudson  SUMMARY OF RECOMMENDATIONS    Code Status/Advance Care Planning:  DNR   Symptom Management:   Headache: Dilaudid 2 mg po every 4 hrs prn    Palliative Prophylaxis:   Bowel Regimen, Frequent Pain Assessment and Oral Care  Additional Recommendations (Limitations, Scope, Preferences):  Full Comfort Care  Psycho-social/Spiritual:   Desire for further Chaplaincy support:no  Additional Recommendations: Education on Hospice--placed call to [facility] the [facility]  Prognosis:   < 6 weeks  Discharge Planning: Home with Hospice      Primary Diagnoses: Present on Admission: . Pneumothorax   I have reviewed the medical record, interviewed the patient and family, and examined the patient. The following aspects are pertinent.  Past Medical History:  Diagnosis Date  . Diabetes mellitus without complication [facility])    Social History   Socioeconomic History  . Marital status: Single    Spouse name: Not on file  . Number of children: Not on file  . Years of education: Not on file  . Highest education level: Not on file  Occupational History  . Not on file  Tobacco Use  . Smoking status:  Former Research scientist (life sciences)  . Smokeless tobacco: Never Used  Substance and Sexual Activity  . Alcohol use: Never  . Drug use: Never  . Sexual activity: Not on file  Other Topics Concern  . Not on file  Social History Narrative  . Not on file   Social Determinants of Health   Financial Resource Strain:   . Difficulty of Paying Living Expenses:   Food Insecurity:   . Worried About Charity fundraiser in the Last Year:   . Arboriculturist in the Last Year:   Transportation Needs:   . Film/video editor (Medical):   Marland Kitchen Lack of Transportation (Non-Medical):   Physical Activity:   . Days of Exercise per Week:   . Minutes of Exercise per Session:   Stress:   . Feeling of Stress :   Social Connections:   . Frequency of Communication with Friends and Family:   . Frequency of Social Gatherings with Friends and Family:   . Attends Religious Services:   . Active Member of Clubs or Organizations:   . Attends Archivist Meetings:   Marland Kitchen Marital Status:    Family History  Problem Relation Age of Onset  . CAD Father   . Diabetes Mellitus II Neg Hx    Scheduled Meds: . gabapentin  300 mg Oral TID  . insulin aspart  0-15 Units Subcutaneous TID WC  . insulin aspart  0-5 Units Subcutaneous QHS  . insulin glargine  25 Units Subcutaneous Daily  . levothyroxine  25 mcg Oral QAC breakfast  . polyethylene glycol  17 g Oral Daily  . senna-docusate  1 tablet Oral BID  . traZODone  50 mg Oral QHS   Continuous Infusions: . diltiazem (CARDIZEM) infusion 10 mg/hr (01/15/20 1844)  . lactated ringers 100 mL/hr at 12/30/2019 0244   PRN Meds:.acetaminophen **OR** acetaminophen, albuterol, HYDROmorphone (DILAUDID) injection, ondansetron **OR** ondansetron (ZOFRAN) IV, oxyCODONE Medications Prior to Admission:  Prior to Admission medications   Medication Sig Start Date End Date Taking? Authorizing Provider  glimepiride (AMARYL) 4 MG tablet Take 4 mg by mouth daily with breakfast.  04/13/17  Yes  [provider]  ketorolac (TORADOL) 10 MG tablet Take 1 tablet (10 mg total) by mouth every 6 (six) hours as needed (Severe Headache). 01/12/20  Yes Maudie Mercury, MD  LANTUS SOLOSTAR 100 UNIT/ML Solostar Pen Inject 15-20 Units into the skin daily.  12/11/19  Yes [provider]  metFORMIN (GLUCOPHAGE) 500 MG tablet Take 500-1,000 mg by mouth See admin instructions. 1000mg  in the morning and 500mg  in the evening 04/13/17  Yes [provider]  gabapentin (NEURONTIN) 300 MG capsule Take 1 capsule (300 mg total) by mouth 3 (three) times daily. As needed for incisional pain Patient not taking: Reported on  01/15/2020 03/02/19   Ivin Poot, MD  naproxen (EC NAPROSYN) 500 MG EC tablet Take 1 tablet (500 mg total) by mouth 2 (two) times daily with a meal. 01/12/20 01/11/21  Maudie Mercury, MD   No Known Allergies Review of Systems  Neurological: Positive for headaches.    Physical Exam Cardiovascular:     Rate and Rhythm: Normal rate.  Skin:    General: Skin is warm and dry.  Neurological:     Mental Status: He is alert.     Vital Signs: BP 111/69   Pulse 87   Temp (!) 97.3 F (36.3 C) (Axillary)   Resp 17   Ht 5\' 11"  (1.803 m)   Wt 68 kg   SpO2 94%   BMI 20.91 kg/m      Pain Score: 0-No pain   SpO2: SpO2: 94 % O2 Device:SpO2: 94 % O2 Flow Rate: .O2 Flow Rate (L/min): 5 L/min  IO: Intake/output summary:   Intake/Output Summary (Last 24 hours) at 12/25/2019 1042 Last data filed at 01/15/2020 1345 Gross per 24 hour  Intake 0 ml  Output 0 ml  Net 0 ml    LBM:   Baseline Weight: Weight: 68 kg Most recent weight: Weight: 68 kg     Palliative Assessment/Data:   Discussed with Dr Nils Pyle, Dr  Gilford Rile and Hospice liason/Cheri  Time In: 0900 Time Out: 1010 Time Total: 70 minutes Greater than 50%  of this time was spent counseling and coordinating care related to the above assessment and plan.  Signed by: Gary Lessen, NP   Please contact  Palliative Medicine Team phone at [phone] for questions and concerns.  For individual provider: See Shea Evans

## 2020-01-23 ENCOUNTER — Other Ambulatory Visit: Payer: Self-pay | Admitting: Cardiothoracic Surgery

## 2020-01-23 DIAGNOSIS — J939 Pneumothorax, unspecified: Secondary | ICD-10-CM

## 2020-01-23 DEATH — deceased

## 2020-01-24 ENCOUNTER — Encounter: Payer: Self-pay | Admitting: Cardiothoracic Surgery

## 2020-01-24 ENCOUNTER — Encounter: Payer: 59 | Admitting: Cardiothoracic Surgery

## 2020-02-02 LAB — FUNGUS CULTURE WITH STAIN

## 2020-02-02 LAB — FUNGUS CULTURE RESULT

## 2020-02-02 LAB — FUNGAL ORGANISM REFLEX

## 2020-02-08 LAB — ACID FAST CULTURE WITH REFLEXED SENSITIVITIES (MYCOBACTERIA): Acid Fast Culture: NEGATIVE

## 2020-02-16 LAB — ACID FAST CULTURE WITH REFLEXED SENSITIVITIES (MYCOBACTERIA): Acid Fast Culture: NEGATIVE

## 2021-12-01 IMAGING — MR MR ABDOMEN WO/W CM
20 series · 48 of 48 positions shown · IV contrast (Gadavist)
Comparison: CT chest 12/24/2019, brain MRI 12/28/2019

CLINICAL DATA: Cavitary LEFT upper lobe pulmonary lesion. Posterior
fossa brain lesion.

EXAM:
MRI ABDOMEN AND PELVIS WITHOUT AND WITH CONTRAST
TECHNIQUE: Multiplanar multisequence MR imaging of the abdomen and pelvis was
performed both before and after the administration of intravenous
contrast.
CONTRAST:  7.5mL GADAVIST GADOBUTROL 1 MMOL/ML IV SOLN

[Series 4: cor haste · coronal · 6.0mm · 1.19mm/px · 2 of 35 slices shown]
[im 1/35]
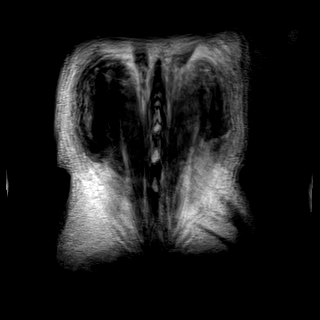
[im 35/35]
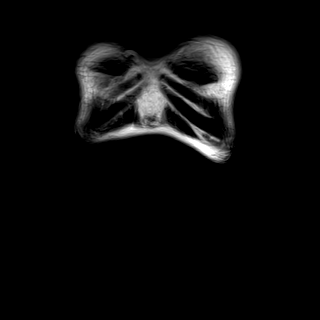

[Series 5: ax haste · axial · 6.0mm · 1.19mm/px · 1 of 30 slices shown]
[im 1/30]
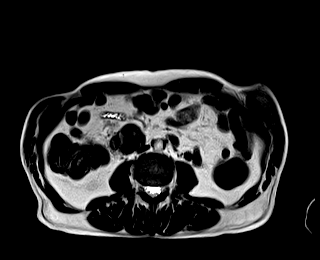

[Series 6: in phase (optional) · axial · 6.0mm · 0.74mm/px · 1 of 30 slices shown]
[im 1/30]
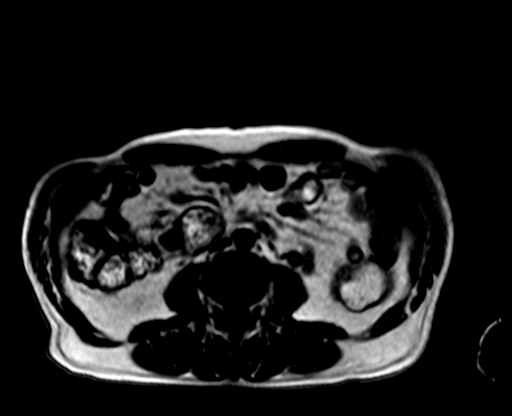

[Series 7: T1 · axial · 6.0mm · 0.74mm/px · 1 of 30 slices shown]
[im 1/30]
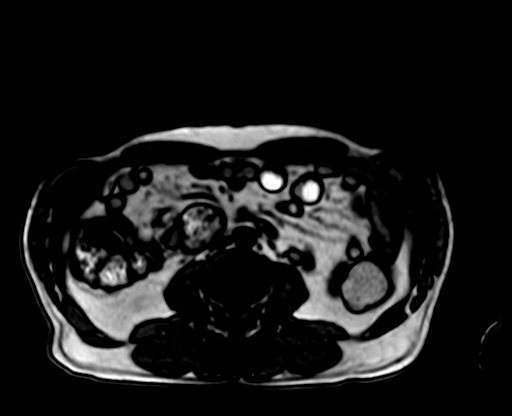

[Series 8: t1_vibe_opp-in_tra_p4_bh · axial · 3.0mm · 1.19mm/px · z∈[-166,+47]mm · 3 of 72 slices shown (1 of 2)]
[im 1/72]
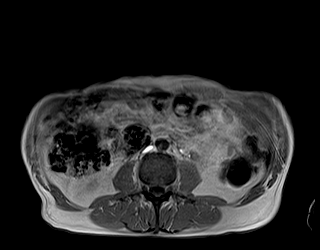
[im 36/72]
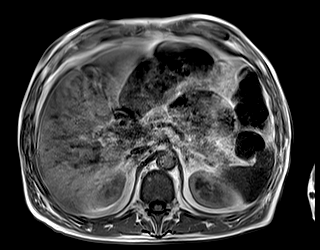
[im 72/72]
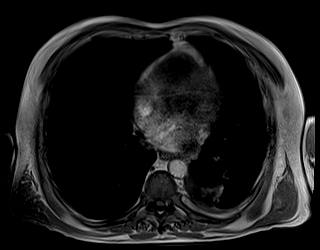

[Series 8: t1_vibe_opp-in_tra_p4_bh · axial · 3.0mm · 1.19mm/px · z∈[-166,+47]mm · 3 of 72 slices shown (2 of 2)]
[im 1/72]
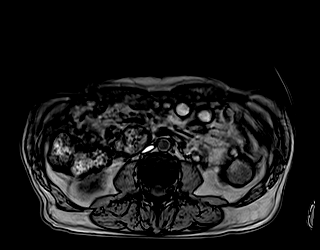
[im 36/72]
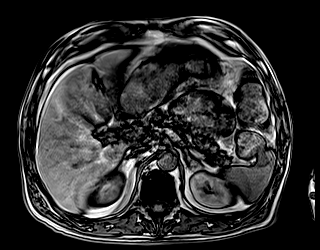
[im 72/72]
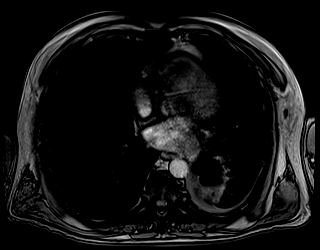

[Series 11: T2 fat-sat · axial · 6.0mm · 1.19mm/px · 1 of 33 slices shown]
[im 1/33]
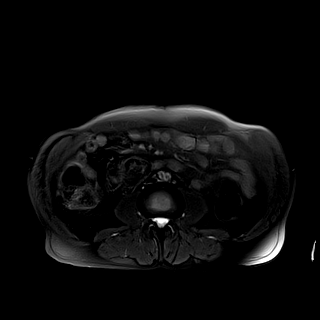

[Series 12: DWI · axial · 6.0mm · 1.42mm/px · z∈[-178,+52]mm · 4 of 99 slices shown (1 of 2)]
[im 1/99]
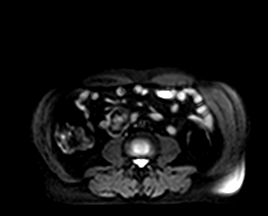
[im 33/99]
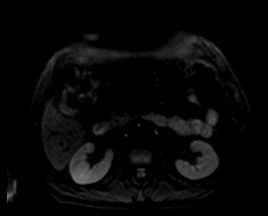
[im 66/99]
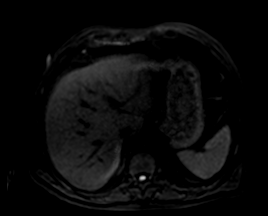
[im 99/99]
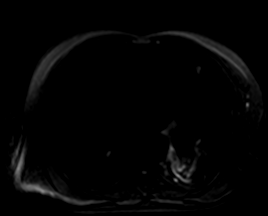

[Series 13: DWI · axial · 6.0mm · 1.42mm/px · 1 of 33 slices shown (2 of 2)]
[im 1/33]
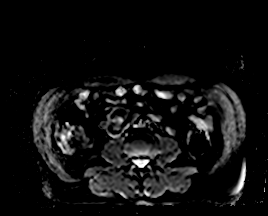

[Series 14: bSSFP · axial · 6.0mm · 0.74mm/px · 1 of 38 slices shown]
[im 1/38]
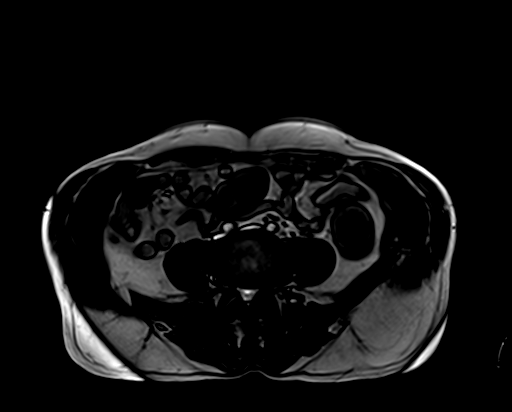

[Series 15: t1_vibe_fs_tra_p4_bh_pre · axial · 3.0mm · 1.19mm/px · z∈[-181,+55]mm · 3 of 80 slices shown]
[im 1/80]
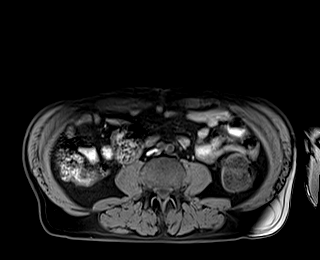
[im 40/80]
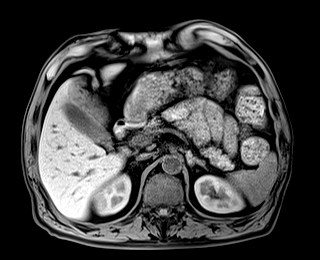
[im 80/80]
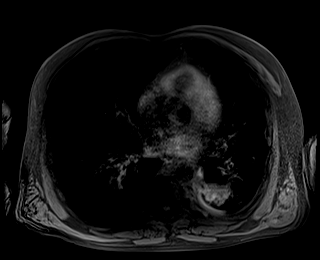

[Series 17: t1_vibe_fs_tra_p4_bh_post · axial · 3.0mm · 1.19mm/px · z∈[-181,+55]mm · 3 of 80 slices shown (1 of 4)]
[im 1/80]
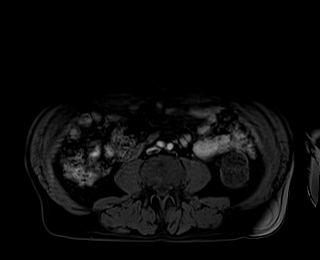
[im 40/80]
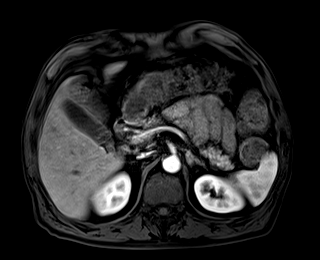
[im 80/80]
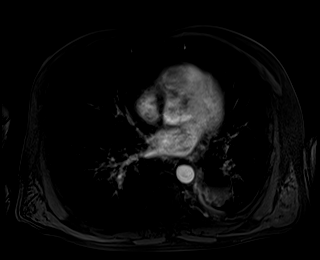

[Series 18: t1_vibe_fs_tra_p4_bh_post_sub · axial · 3.0mm · 1.19mm/px · z∈[-181,+55]mm · 3 of 80 slices shown (1 of 4)]
[im 1/80]
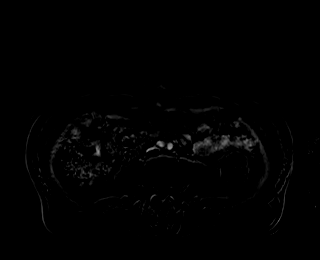
[im 40/80]
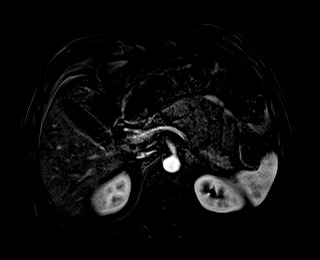
[im 80/80]
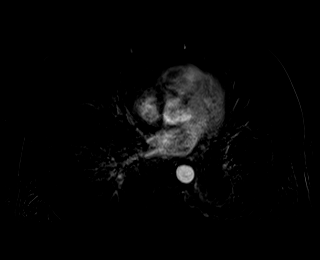

[Series 19: t1_vibe_fs_tra_p4_bh_post · axial · 3.0mm · 1.19mm/px · z∈[-181,+55]mm · 3 of 80 slices shown (2 of 4)]
[im 1/80]
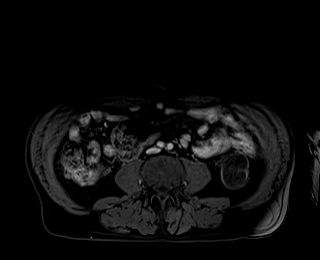
[im 40/80]
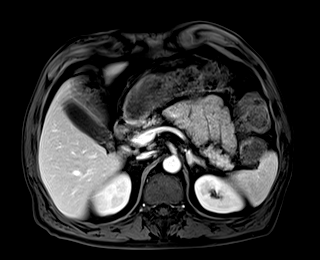
[im 80/80]
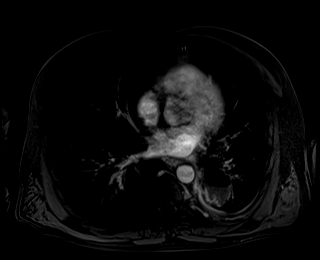

[Series 20: t1_vibe_fs_tra_p4_bh_post_sub · axial · 3.0mm · 1.19mm/px · z∈[-181,+55]mm · 3 of 80 slices shown (2 of 4)]
[im 1/80]
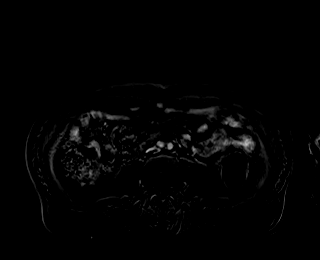
[im 40/80]
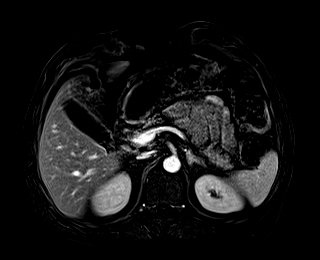
[im 80/80]
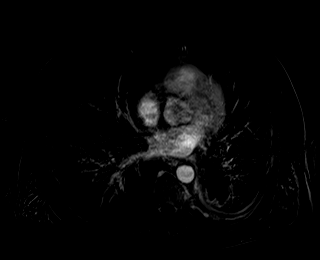

[Series 21: t1_vibe_fs_tra_p4_bh_post · axial · 3.0mm · 1.19mm/px · z∈[-181,+55]mm · 3 of 80 slices shown (3 of 4)]
[im 1/80]
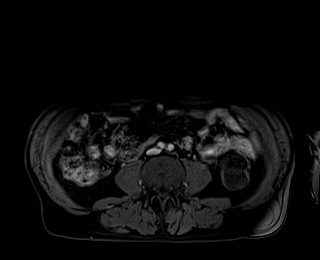
[im 40/80]
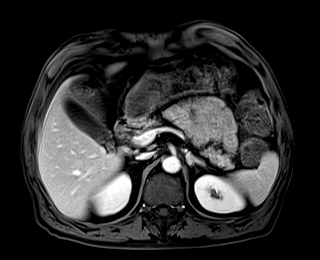
[im 80/80]
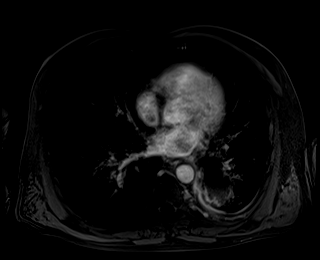

[Series 22: t1_vibe_fs_tra_p4_bh_post_sub · axial · 3.0mm · 1.19mm/px · z∈[-181,+55]mm · 3 of 80 slices shown (3 of 4)]
[im 1/80]
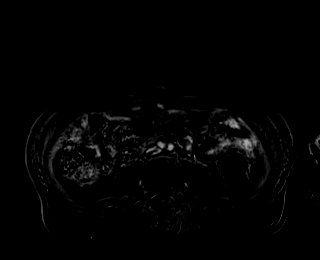
[im 40/80]
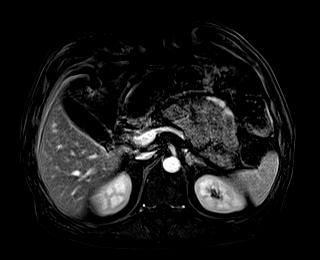
[im 80/80]
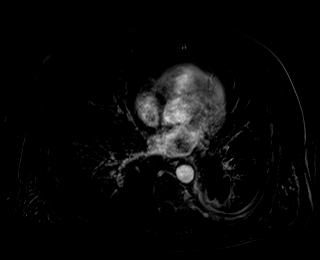

[Series 23: t1_vibe_fs_tra_p4_bh_post · axial · 3.0mm · 1.19mm/px · z∈[-181,+55]mm · 3 of 80 slices shown (4 of 4)]
[im 1/80]
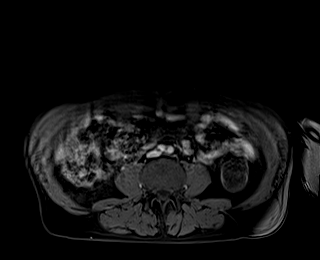
[im 40/80]
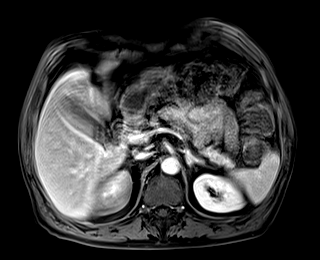
[im 80/80]
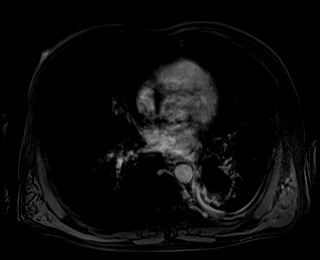

[Series 24: t1_vibe_fs_tra_p4_bh_post_sub · axial · 3.0mm · 1.19mm/px · z∈[-181,+55]mm · 3 of 80 slices shown (4 of 4)]
[im 1/80]
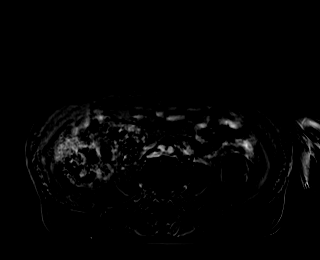
[im 40/80]
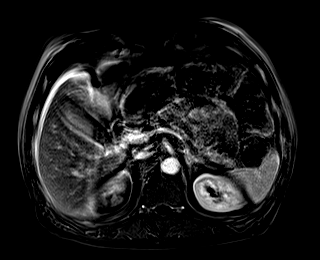
[im 80/80]
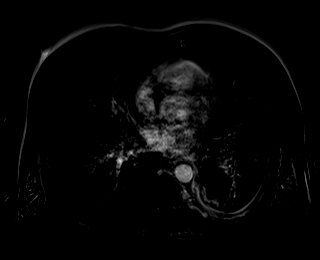

[Series 25: T1 dynamic post-contrast · coronal · 3.0mm · 1.31mm/px · 3 of 72 slices shown]
[im 1/72]
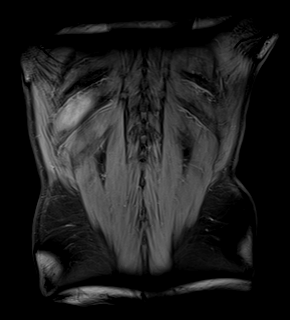
[im 36/72]
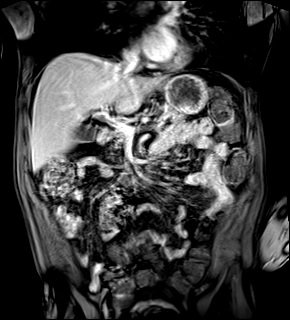
[im 72/72]
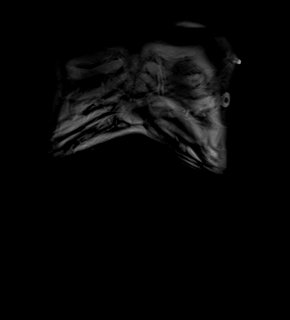

[48 of 48 positions shown; findings below may reference images not displayed]

FINDINGS: COMBINED FINDINGS FOR BOTH MR ABDOMEN AND PELVIS

Lower chest: Large cavitary mass again noted in the LEFT lower lobe.

Hepatobiliary: No enhancing hepatic lesion. Gallbladder normal. No
biliary duct dilatation.

Pancreas: Pancreas is normal. No ductal dilatation. No pancreatic
inflammation.

Spleen: Normal spleen

Adrenals/urinary tract: Adrenal glands are normal. Cystic lesion in
the cortex of the RIGHT kidney measures 1.5 cm (image 24/series 5).
Cystic lesion has mild complexity on the T2 weighted imaging however
has no post-contrast enhancement by direct intensity measurement
(series 15, series 17) as well as by a subtraction imaging (series
18)

Stomach/Bowel: Stomach and duodenum are normal. Small bowel normal.
Moderate volume stool throughout the colon. No obstructing lesion
identified.

Vascular/Lymphatic: Abdominal aorta is normal caliber. No periportal
or retroperitoneal adenopathy. No pelvic adenopathy.

Reproductive: Prostate normal.

Other: No free fluid.

Musculoskeletal: No aggressive osseous lesion identified.
IMPRESSION: 1. No evidence of malignancy in the abdomen pelvis by MRI imaging.
2. No evidence of bowel lesion. There is some limitation in bowel
evaluation with MRI.
3. Normal adrenal glands.
4. Nonenhancing cyst of the RIGHT kidney (Bosniak II)
5. Again demonstrated, large cavitary process in the LEFT lower
lobe.

## 2021-12-03 IMAGING — CT CT GUIDANCE NEEDLE PLACEMENT
1 of 2 series · 14 of 32 positions shown, 19 images · non-contrast
Comparison: none

INDICATION: Left lower lobe cavitary mass

[Series 2: i-spiral 5.0 b40f · axial · 0.84mm/px · z∈[+86,+261]mm · 14 of 56 slices shown, 19 images]
[im 3/56  soft-tissue]
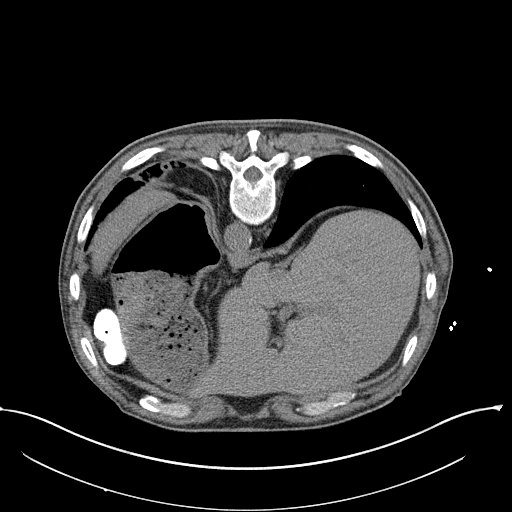
[im 3/56  bone]
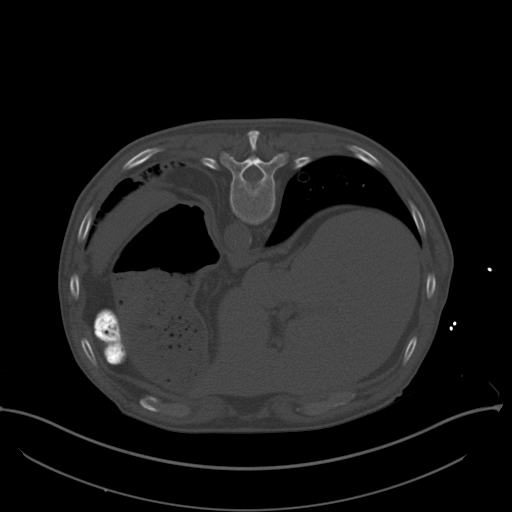
[im 8/56  soft-tissue]
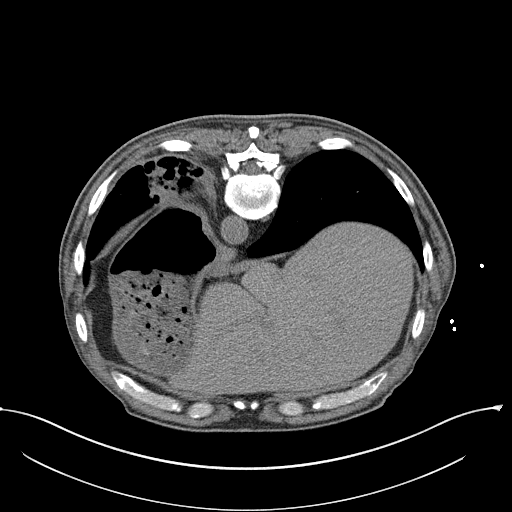
[im 11/56  soft-tissue]
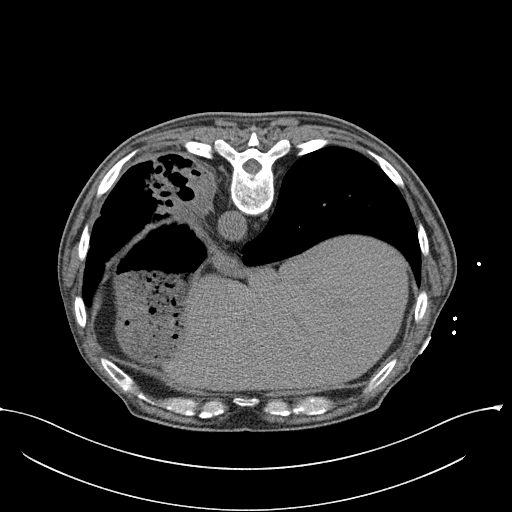
[im 16/56  soft-tissue]
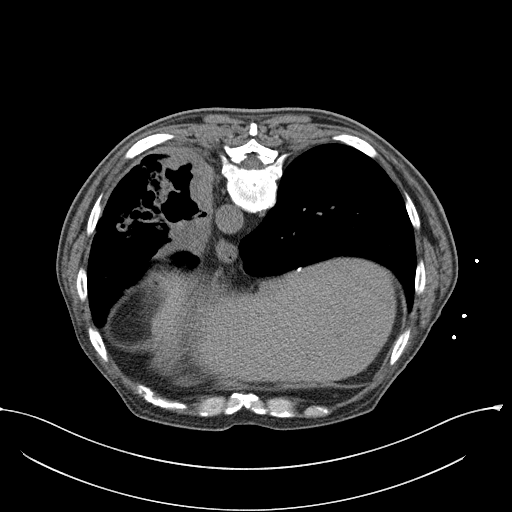
[im 19/56  soft-tissue]
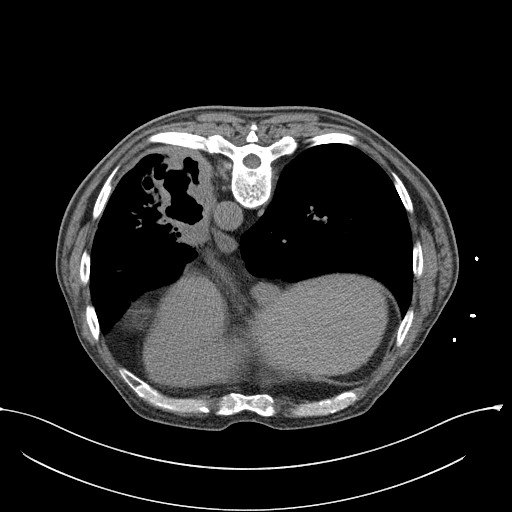
[im 24/56  soft-tissue]
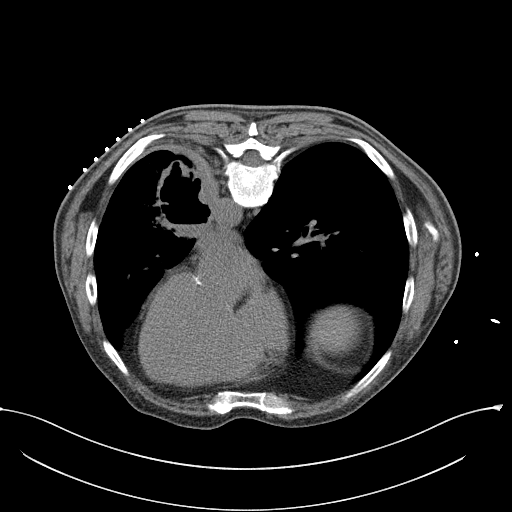
[im 29/56  soft-tissue]
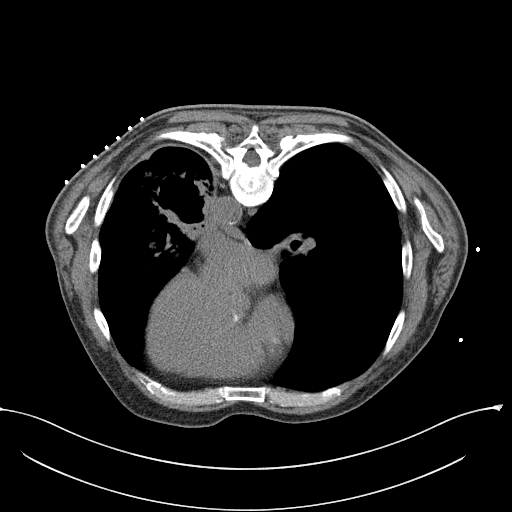
[im 32/56  soft-tissue]
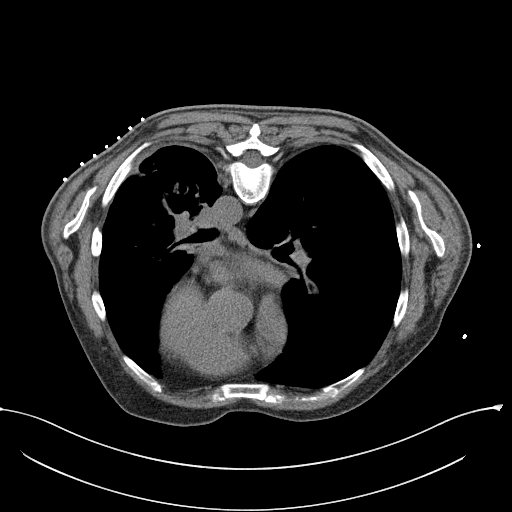
[im 37/56  soft-tissue]
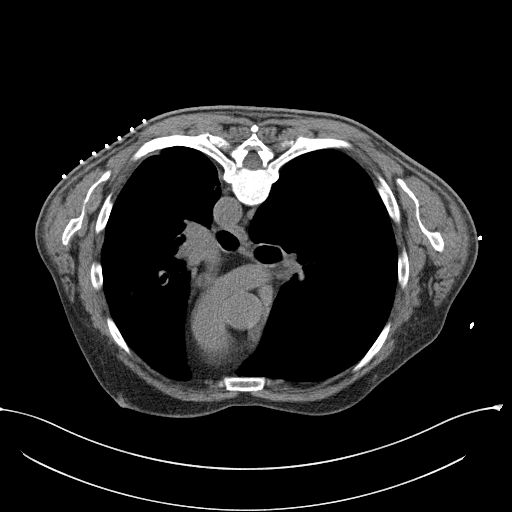
[im 37/56  bone]
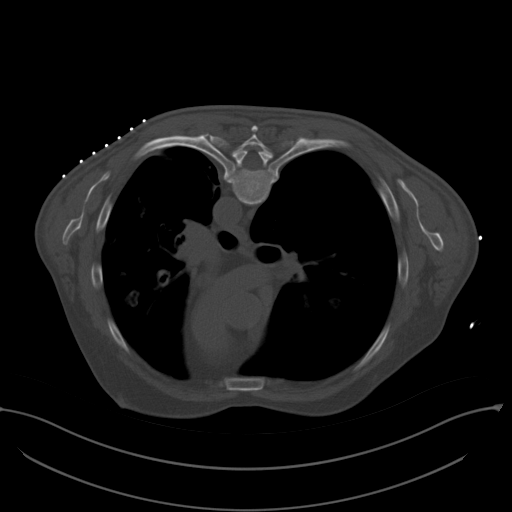
[im 40/56  soft-tissue]
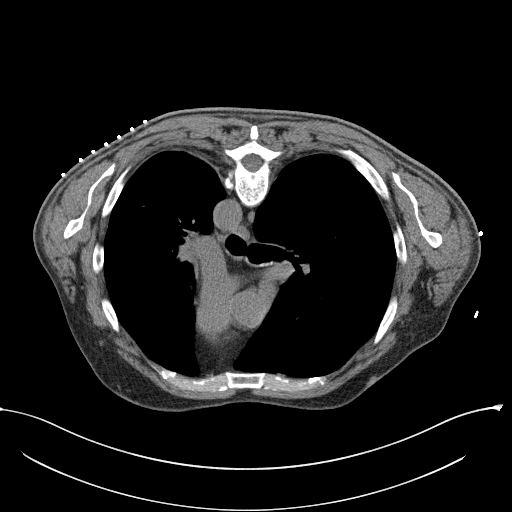
[im 45/56  soft-tissue]
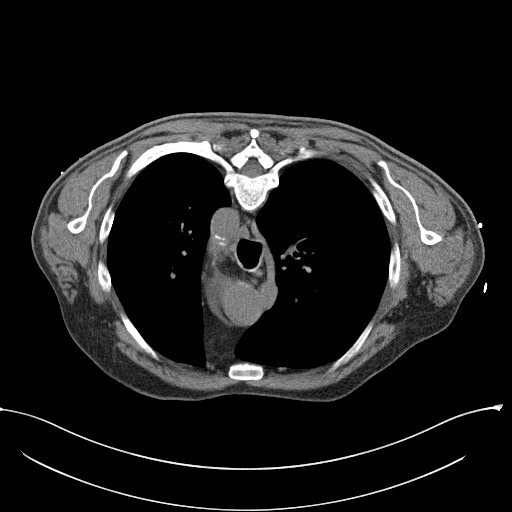
[im 45/56  lung]
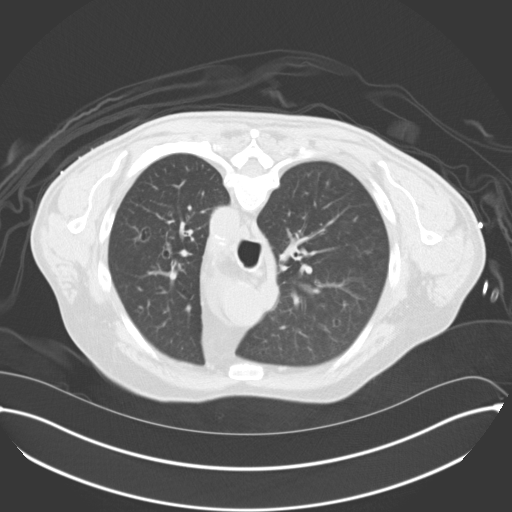
[im 48/56  soft-tissue]
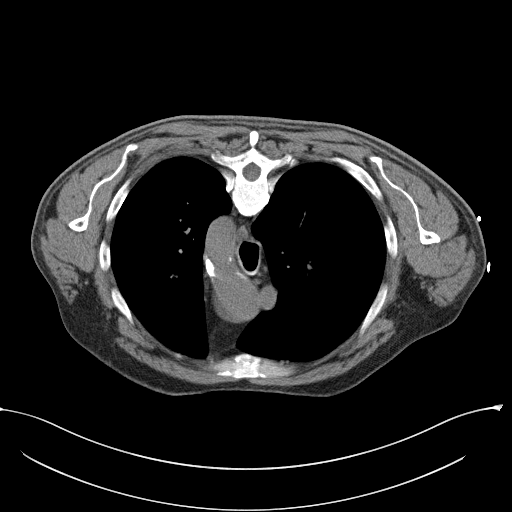
[im 48/56  lung]
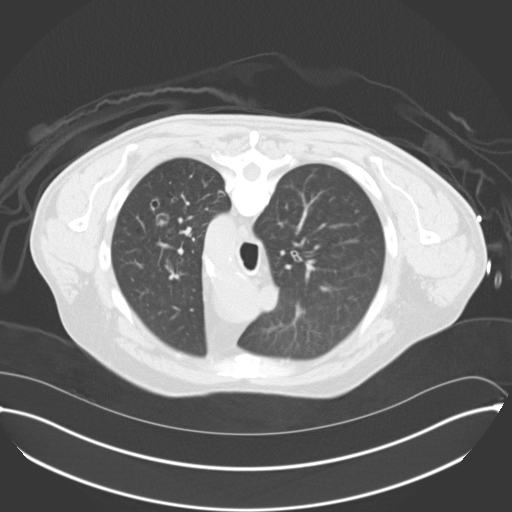
[im 50/56  lung]
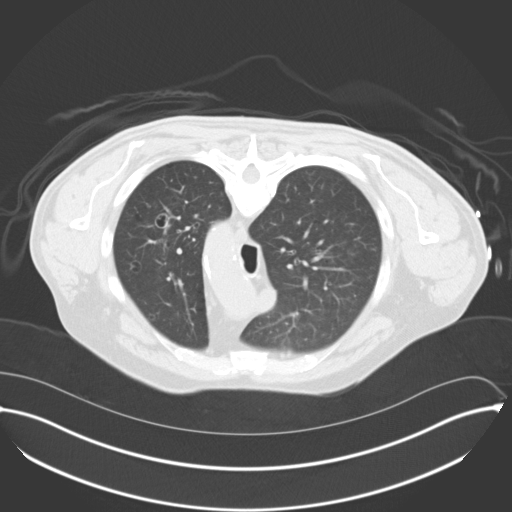
[im 53/56  soft-tissue]
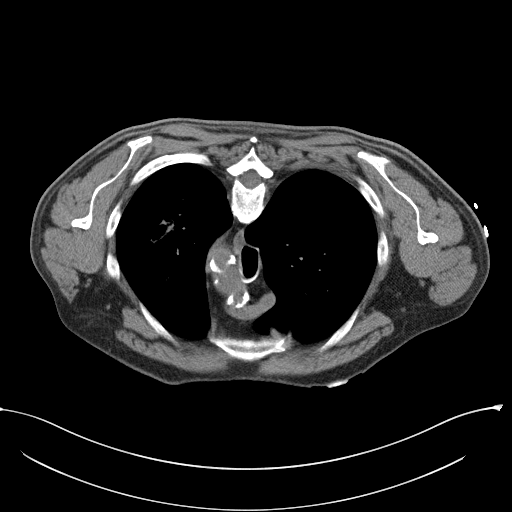
[im 53/56  lung]
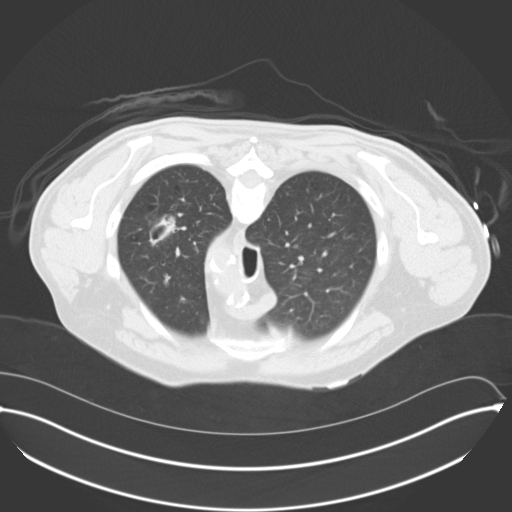

[14 of 32 positions shown; findings below may reference images not displayed]

EXAM:
CT-GUIDED LEFT LOWER LOBE CAVITARY MASS CORE BIOPSY

MEDICATIONS:
1% lidocaine local

ANESTHESIA/SEDATION:
Moderate (conscious) sedation was employed during this procedure. A
total of Versed 1.5 mg and Fentanyl 75 mcg was administered
intravenously.

Moderate Sedation Time: 13 minutes the patient's level of
consciousness and vital signs were monitored continuously by
radiology nursing throughout the procedure under my direct
supervision.

FLUOROSCOPY TIME:  Fluoroscopy Time: None.

COMPLICATIONS:
None immediate.

PROCEDURE:
Informed written consent was obtained from the patient after a
thorough discussion of the procedural risks, benefits and
alternatives. All questions were addressed. Maximal Sterile Barrier
Technique was utilized including caps, mask, sterile gowns, sterile
gloves, sterile drape, hand hygiene and skin antiseptic. A timeout
was performed prior to the initiation of the procedure.

Previous imaging reviewed. Patient position prone. Noncontrast
localization CT performed. The medial left lower lobe cavitary mass
was localized and marked for a posterior paraspinous approach.

Under sterile conditions and local anesthesia, a 17 gauge 6.8 cm
guide needle was advanced to the medial cavitary lesion margin.
Needle position confirmed with CT. 18 gauge core biopsies obtained
and placed in saline. Sample sent for pathology and pan culture.
Needle tract occluded with the bio sentry device. Postprocedure
imaging demonstrates no hemorrhage or hematoma. Patient tolerated
biopsy well.
IMPRESSION: Successful CT-guided core biopsy of the left lower lobe cavitary
mass
